# Patient Record
Sex: Male | Born: 1935 | Race: White | Hispanic: No | Marital: Married | Smoking: Former smoker
Health system: Southern US, Community
[De-identification: ages and names within clinical notes are randomized; demographics above are authoritative.]

## PROBLEM LIST (undated history)

## (undated) DIAGNOSIS — C801 Malignant (primary) neoplasm, unspecified: Secondary | ICD-10-CM

## (undated) DIAGNOSIS — I639 Cerebral infarction, unspecified: Secondary | ICD-10-CM

## (undated) DIAGNOSIS — I4891 Unspecified atrial fibrillation: Secondary | ICD-10-CM

## (undated) DIAGNOSIS — R569 Unspecified convulsions: Secondary | ICD-10-CM

## (undated) DIAGNOSIS — I10 Essential (primary) hypertension: Secondary | ICD-10-CM

## (undated) HISTORY — PX: TONSILLECTOMY AND ADENOIDECTOMY: SUR1326

---

## 1979-01-04 HISTORY — PX: CRANIOTOMY: SHX93

## 1999-12-24 ENCOUNTER — Ambulatory Visit (HOSPITAL_COMMUNITY): Admission: RE | Admit: 1999-12-24 | Discharge: 1999-12-24 | Payer: Self-pay | Admitting: Gastroenterology

## 1999-12-24 ENCOUNTER — Encounter (INDEPENDENT_AMBULATORY_CARE_PROVIDER_SITE_OTHER): Payer: Self-pay | Admitting: *Deleted

## 2002-05-28 ENCOUNTER — Encounter: Admission: RE | Admit: 2002-05-28 | Discharge: 2002-08-26 | Payer: Self-pay | Admitting: Internal Medicine

## 2010-01-11 ENCOUNTER — Inpatient Hospital Stay (HOSPITAL_COMMUNITY)
Admission: EM | Admit: 2010-01-11 | Discharge: 2010-01-18 | Payer: Self-pay | Source: Home / Self Care | Attending: Internal Medicine | Admitting: Internal Medicine

## 2010-01-18 LAB — CBC
HCT: 39.9 % (ref 39.0–52.0)
HCT: 34 % — ABNORMAL LOW (ref 39.0–52.0)
HCT: 33.4 % — ABNORMAL LOW (ref 39.0–52.0)
HCT: 31.2 % — ABNORMAL LOW (ref 39.0–52.0)
HCT: 30.5 % — ABNORMAL LOW (ref 39.0–52.0)
HCT: 29.7 % — ABNORMAL LOW (ref 39.0–52.0)
HCT: 28.1 % — ABNORMAL LOW (ref 39.0–52.0)
Hemoglobin: 13.3 g/dL (ref 13.0–17.0)
Hemoglobin: 11.5 g/dL — ABNORMAL LOW (ref 13.0–17.0)
Hemoglobin: 10.9 g/dL — ABNORMAL LOW (ref 13.0–17.0)
Hemoglobin: 10 g/dL — ABNORMAL LOW (ref 13.0–17.0)
Hemoglobin: 10 g/dL — ABNORMAL LOW (ref 13.0–17.0)
Hemoglobin: 9.6 g/dL — ABNORMAL LOW (ref 13.0–17.0)
Hemoglobin: 9.1 g/dL — ABNORMAL LOW (ref 13.0–17.0)
MCH: 29.8 pg (ref 26.0–34.0)
MCH: 30.3 pg (ref 26.0–34.0)
MCH: 29.3 pg (ref 26.0–34.0)
MCH: 29.1 pg (ref 26.0–34.0)
MCH: 29.8 pg (ref 26.0–34.0)
MCH: 29.4 pg (ref 26.0–34.0)
MCH: 29.5 pg (ref 26.0–34.0)
MCHC: 33.3 g/dL (ref 30.0–36.0)
MCHC: 33.8 g/dL (ref 30.0–36.0)
MCHC: 32.6 g/dL (ref 30.0–36.0)
MCHC: 32.1 g/dL (ref 30.0–36.0)
MCHC: 32.8 g/dL (ref 30.0–36.0)
MCHC: 32.3 g/dL (ref 30.0–36.0)
MCHC: 32.4 g/dL (ref 30.0–36.0)
MCV: 89.5 fL (ref 78.0–100.0)
MCV: 89.7 fL (ref 78.0–100.0)
MCV: 89.8 fL (ref 78.0–100.0)
MCV: 90.7 fL (ref 78.0–100.0)
MCV: 90.8 fL (ref 78.0–100.0)
MCV: 91.1 fL (ref 78.0–100.0)
MCV: 91.2 fL (ref 78.0–100.0)
Platelets: 284 10*3/uL (ref 150–400)
Platelets: 245 10*3/uL (ref 150–400)
Platelets: 238 10*3/uL (ref 150–400)
Platelets: 223 10*3/uL (ref 150–400)
Platelets: 227 10*3/uL (ref 150–400)
Platelets: 177 10*3/uL (ref 150–400)
Platelets: 201 10*3/uL (ref 150–400)
RBC: 4.46 MIL/uL (ref 4.22–5.81)
RBC: 3.79 MIL/uL — ABNORMAL LOW (ref 4.22–5.81)
RBC: 3.72 MIL/uL — ABNORMAL LOW (ref 4.22–5.81)
RBC: 3.44 MIL/uL — ABNORMAL LOW (ref 4.22–5.81)
RBC: 3.36 MIL/uL — ABNORMAL LOW (ref 4.22–5.81)
RBC: 3.26 MIL/uL — ABNORMAL LOW (ref 4.22–5.81)
RBC: 3.08 MIL/uL — ABNORMAL LOW (ref 4.22–5.81)
RDW: 15 % (ref 11.5–15.5)
RDW: 15.1 % (ref 11.5–15.5)
RDW: 15.3 % (ref 11.5–15.5)
RDW: 15.5 % (ref 11.5–15.5)
RDW: 15.5 % (ref 11.5–15.5)
RDW: 15.4 % (ref 11.5–15.5)
RDW: 15.4 % (ref 11.5–15.5)
WBC: 16.1 10*3/uL — ABNORMAL HIGH (ref 4.0–10.5)
WBC: 19.2 10*3/uL — ABNORMAL HIGH (ref 4.0–10.5)
WBC: 17.4 10*3/uL — ABNORMAL HIGH (ref 4.0–10.5)
WBC: 13.8 10*3/uL — ABNORMAL HIGH (ref 4.0–10.5)
WBC: 12 10*3/uL — ABNORMAL HIGH (ref 4.0–10.5)
WBC: 9.5 10*3/uL (ref 4.0–10.5)
WBC: 10 10*3/uL (ref 4.0–10.5)

## 2010-01-18 LAB — HEMOGLOBIN AND HEMATOCRIT, BLOOD
HCT: 29.2 % — ABNORMAL LOW (ref 39.0–52.0)
Hemoglobin: 9.5 g/dL — ABNORMAL LOW (ref 13.0–17.0)

## 2010-01-18 LAB — HEMOGLOBIN A1C
Hgb A1c MFr Bld: 5.9 % — ABNORMAL HIGH (ref <5.7)
Mean Plasma Glucose: 123 mg/dL — ABNORMAL HIGH (ref <117)

## 2010-01-18 LAB — GLUCOSE, CAPILLARY
Glucose-Capillary: 109 mg/dL — ABNORMAL HIGH (ref 70–99)
Glucose-Capillary: 109 mg/dL — ABNORMAL HIGH (ref 70–99)
Glucose-Capillary: 110 mg/dL — ABNORMAL HIGH (ref 70–99)
Glucose-Capillary: 124 mg/dL — ABNORMAL HIGH (ref 70–99)
Glucose-Capillary: 129 mg/dL — ABNORMAL HIGH (ref 70–99)
Glucose-Capillary: 129 mg/dL — ABNORMAL HIGH (ref 70–99)
Glucose-Capillary: 147 mg/dL — ABNORMAL HIGH (ref 70–99)
Glucose-Capillary: 114 mg/dL — ABNORMAL HIGH (ref 70–99)
Glucose-Capillary: 115 mg/dL — ABNORMAL HIGH (ref 70–99)
Glucose-Capillary: 116 mg/dL — ABNORMAL HIGH (ref 70–99)
Glucose-Capillary: 116 mg/dL — ABNORMAL HIGH (ref 70–99)
Glucose-Capillary: 130 mg/dL — ABNORMAL HIGH (ref 70–99)
Glucose-Capillary: 134 mg/dL — ABNORMAL HIGH (ref 70–99)
Glucose-Capillary: 136 mg/dL — ABNORMAL HIGH (ref 70–99)
Glucose-Capillary: 179 mg/dL — ABNORMAL HIGH (ref 70–99)

## 2010-01-18 LAB — COMPREHENSIVE METABOLIC PANEL
ALT: 14 U/L (ref 0–53)
AST: 19 U/L (ref 0–37)
Albumin: 3.4 g/dL — ABNORMAL LOW (ref 3.5–5.2)
Alkaline Phosphatase: 73 U/L (ref 39–117)
BUN: 14 mg/dL (ref 6–23)
CO2: 26 mEq/L (ref 19–32)
Calcium: 9 mg/dL (ref 8.4–10.5)
Chloride: 104 mEq/L (ref 96–112)
Creatinine, Ser: 1.1 mg/dL (ref 0.4–1.5)
GFR calc Af Amer: >60 mL/min (ref >60)
GFR calc non Af Amer: >60 mL/min (ref >60)
Glucose, Bld: 196 mg/dL — ABNORMAL HIGH (ref 70–99)
Potassium: 4.4 mEq/L (ref 3.5–5.1)
Sodium: 141 mEq/L (ref 135–145)
Total Bilirubin: 0.9 mg/dL (ref 0.3–1.2)
Total Protein: 6.3 g/dL (ref 6.0–8.3)

## 2010-01-18 LAB — URINALYSIS, ROUTINE W REFLEX MICROSCOPIC
Bilirubin Urine: NEGATIVE
Hgb urine dipstick: NEGATIVE
Ketones, ur: NEGATIVE mg/dL
Leukocytes, UA: NEGATIVE
Nitrite: NEGATIVE
Protein, ur: 30 mg/dL — AB
Specific Gravity, Urine: 1.024 (ref 1.005–1.030)
Urine Glucose, Fasting: NEGATIVE mg/dL
Urobilinogen, UA: 1 mg/dL (ref 0.0–1.0)
pH: 6.5 (ref 5.0–8.0)

## 2010-01-18 LAB — BASIC METABOLIC PANEL
BUN: 25 mg/dL — ABNORMAL HIGH (ref 6–23)
BUN: 24 mg/dL — ABNORMAL HIGH (ref 6–23)
CO2: 29 mEq/L (ref 19–32)
CO2: 27 mEq/L (ref 19–32)
Calcium: 8.8 mg/dL (ref 8.4–10.5)
Calcium: 8.3 mg/dL — ABNORMAL LOW (ref 8.4–10.5)
Chloride: 107 mEq/L (ref 96–112)
Chloride: 110 mEq/L (ref 96–112)
Creatinine, Ser: 1.46 mg/dL (ref 0.4–1.5)
Creatinine, Ser: 0.98 mg/dL (ref 0.4–1.5)
GFR calc Af Amer: 57 mL/min — ABNORMAL LOW (ref >60)
GFR calc Af Amer: >60 mL/min (ref >60)
GFR calc non Af Amer: 47 mL/min — ABNORMAL LOW (ref >60)
GFR calc non Af Amer: >60 mL/min (ref >60)
Glucose, Bld: 122 mg/dL — ABNORMAL HIGH (ref 70–99)
Glucose, Bld: 115 mg/dL — ABNORMAL HIGH (ref 70–99)
Potassium: 4.9 mEq/L (ref 3.5–5.1)
Potassium: 4.1 mEq/L (ref 3.5–5.1)
Sodium: 144 mEq/L (ref 135–145)
Sodium: 144 mEq/L (ref 135–145)

## 2010-01-18 LAB — TYPE AND SCREEN
ABO/RH(D): B NEG
Antibody Screen: NEGATIVE

## 2010-01-18 LAB — POCT CARDIAC MARKERS
CKMB, poc: <1 ng/mL — ABNORMAL LOW (ref 1.0–8.0)
Myoglobin, poc: 99.9 ng/mL (ref 12–200)
Troponin i, poc: <0.05 ng/mL (ref 0.00–0.09)

## 2010-01-18 LAB — DIFFERENTIAL
Basophils Absolute: 0 10*3/uL (ref 0.0–0.1)
Basophils Relative: 0 % (ref 0–1)
Eosinophils Absolute: 0 10*3/uL (ref 0.0–0.7)
Eosinophils Relative: 0 % (ref 0–5)
Lymphocytes Relative: 12 % (ref 12–46)
Lymphs Abs: 2 10*3/uL (ref 0.7–4.0)
Monocytes Absolute: 0.6 10*3/uL (ref 0.1–1.0)
Monocytes Relative: 4 % (ref 3–12)
Neutro Abs: 13.5 10*3/uL — ABNORMAL HIGH (ref 1.7–7.7)
Neutrophils Relative %: 84 % — ABNORMAL HIGH (ref 43–77)

## 2010-01-18 LAB — LIPASE, BLOOD: Lipase: 32 U/L (ref 11–59)

## 2010-01-18 LAB — ABO/RH: ABO/RH(D): B NEG

## 2010-01-18 LAB — URINE CULTURE
Colony Count: NO GROWTH
Culture  Setup Time: 201201091525
Culture: NO GROWTH

## 2010-01-18 LAB — MAGNESIUM: Magnesium: 2.3 mg/dL (ref 1.5–2.5)

## 2010-01-18 LAB — APTT: aPTT: 30 seconds (ref 24–37)

## 2010-01-18 LAB — URINE MICROSCOPIC-ADD ON

## 2010-01-18 LAB — LACTIC ACID, PLASMA: Lactic Acid, Venous: 4 mmol/L — ABNORMAL HIGH (ref 0.5–2.2)

## 2010-01-18 LAB — PROTIME-INR
INR: 1.13 (ref 0.00–1.49)
Prothrombin Time: 14.7 seconds (ref 11.6–15.2)

## 2010-01-18 LAB — DIGOXIN LEVEL: Digoxin Level: 0.6 ng/mL — ABNORMAL LOW (ref 0.8–2.0)

## 2010-01-25 NOTE — Consult Note (Signed)
Arthur Wood, Arthur Wood NO.:  [id number]  MEDICAL RECORD NO.:  [id number]          PATIENT TYPE:  INP  LOCATION:  1506                         FACILITY:  [facility]  PHYSICIAN:  Valetta Fuller, M.D.  DATE OF BIRTH:  1931-02-14  DATE OF CONSULTATION:  01/12/2010 DATE OF DISCHARGE:                                CONSULTATION   REASON FOR CONSULTATION:  Left renal mass.  HISTORY OF PRESENT ILLNESS:  Arthur Wood is 77 years of age with a number of medical issues.  He does not appear to have any prior urologic history.  The patient was admitted yesterday with some right lower quadrant abdominal pain and an abdominal mass.  The patient has been known to have a large right inguinal hernia, but began experiencing more discomfort in his right lower quadrant.  A CT scan was done, which showed a right lower quadrant mass, probably a hematoma, but the etiology remains somewhat unclear.  There was also an incidental 2-cm renal mass noted, consistent with a renal cell carcinoma.  The patient subsequently had an MRI of his abdomen with contrast.  Again, an exophytic 2.2-cm mass was noted in the upper pole of the left kidney medially.  Findings again most consistent with small renal cell carcinoma.  Within the right hemipelvis, there was a complex fluid collection.  The patient had evidence of a large right inguinal hernia containing bowel.  There was also this complex fluid collection probably most consistent with complex hematoma; but again, that has not been completely sorted out at this point.  The family believes that General Surgery is contemplating a laparoscopic assessment along with an inguinal hernia repair.  The patient is having no significant voiding complaints at this time.  He does continue to have discomfort in his right lower quadrant, worse with movement and certain positions.  Patient's past medical history is notable for: 1. Significant history of CVA. 2. The  patient has also been diagnosed with hypertension, asthma and     atrial fibrillation. 3. He has had a left inguinal hernia repair in the past.  The patient's medications at the time of admission included hydrochlorothiazide, clonidine, lisinopril, digoxin, Lasix, nifedipine, Plavix, potassium, phenobarbital, Serevent, albuterol, Azmacort.  He has no drug allergies.  The patient has a prior significant tobacco use history and quit 20-25 years ago.  REVIEW OF SYSTEMS:  Positive for abdominal discomfort, some fatigue and decreased appetite.  It is otherwise negative.  On exam, he is a well-developed, well-nourished male.  He appears alert and oriented.  He does not appear to be atoxic or in acute distress.  He is currently afebrile, with a temperature of 99.4.  Blood pressure is 136/64 with a pulse of 82.  No obvious JVD.  Respiratory effort is normal.  The patient's abdomen is soft.  Positive right lower quadrant tenderness.  No obvious hepatosplenomegaly.  No definitive mass appreciated.  GENITOURINARY EXAM:  Shows an enlarged right hemiscrotum with obvious bowel contents.  No other obvious urologic problems noted. EXTREMITIES:  Without obvious tenderness or edema.  LABORATORY DATA:  The patient's creatinine is 1.46.  Patient's hemoglobin  was 10.0, with a white count of 13.8 thousand.  ASSESSMENT:  Small 2-cm left renal mass.  The imaging studies with CT and MRI are certainly consistent with a small renal cell carcinoma. This problem is obviously not contributing at this time to this patient's current acute illness.  I explained to the family that there are a number of approaches for small renal masses.  In elderly patients who have a number of medical comorbidities, serial observation to assess growth of the lesion with intervention if it does increase in size is certainly one approach.  The patient is potentially a candidate for open partial nephrectomy or laparoscopic partial  nephrectomy, but small lesions of this size in patients with these medical comorbidities are often best managed with percutaneous approaches by interventional radiology with either cryo or radiofrequency ablation.  It is conceivable that surgery could be coordinated with one of our laparoscopic urologists, Dr. Laverle Patter or Dr. Retta Diones, but I suspect the best approach for this patient is to deal with the acute problem with further investigation of this right pelvic mass along with correction of the hernia and subsequent consultation with Interventional Radiology for consideration of a biopsy and ablative procedure for the left renal mass.  We will be happy to discuss the situation with Dr. Nehemiah Settle or Dr. Cicero Duck.     Valetta Fuller, M.D.     DSG/MEDQ  D:  01/12/2010  T:  01/12/2010  Job:  [id number]  cc:   Currie Paris, M.D. [address] [place] [address]  Deirdre Peer. Lanny Lipkin, M.D.  Electronically Signed by Barron Alvine M.D. on 01/25/2010 09:21:56 AM

## 2010-01-27 ENCOUNTER — Other Ambulatory Visit: Payer: Self-pay | Admitting: Urology

## 2010-01-27 DIAGNOSIS — N2889 Other specified disorders of kidney and ureter: Secondary | ICD-10-CM

## 2010-02-02 NOTE — H&P (Signed)
Arthur Wood, Arthur Wood NO.:  [id number]  MEDICAL RECORD NO.:  [id number]          PATIENT TYPE:  EMS  LOCATION:  ED                           FACILITY:  [facility]  PHYSICIAN:  Lonia Blood, M.D.DATE OF BIRTH:  24-Mar-1931  DATE OF ADMISSION:  01/11/2010 DATE OF DISCHARGE:                             HISTORY & PHYSICAL   PRIMARY CARE PHYSICIAN:  Deirdre Peer. Polite, M.D.  CHIEF COMPLAINT:  Abdominal pain.  HISTORY OF PRESENT ILLNESS:  The patient is a very pleasant 77 year old gentleman with a complex past medical history as detailed below.  He was in his usual state of health until Saturday (it is presently Monday) when he developed the acute onset of right lower quadrant abdominal pain.  He reports that concomitant with this, he has not been able to have a bowel movement since that time.  He describes the pain as severe and cramping and coming in paroxysms.  It seems to be worse with movement or attempts to ambulate.  There is some associated nausea but no vomiting.  There has been no hematemesis, hematochezia or melena. There has been no lightheadedness, dizzy spells chest pain, shortness of breath or diaphoresis.  There have been no subjective fevers per his report.  The patient has a known large right-sided inguinal hernia but he states that he usually is able to reduce this himself and it does not cause any problems.  The patient's appetite has been severely diminished during the course of his abdominal pain.  REVIEW OF SYSTEMS:  A comprehensive review of systems is unrevealing with exception to the multiple positive elements noted in the history of present illness above.  PAST MEDICAL HISTORY: 1. Hypertension. 2. "Asthma" - likely COPD with frequent episodes of acute bronchitis. 3. Paroxysmal atrial fibrillation - not on Coumadin due to apparent     history of intracranial hemorrhage while on Coumadin. 4. History of basal cell carcinomas with multiple  resections   (a) Face and chest, 1996; left arm and right ear, 2004. 1. History of CVA x3 in 1981, 1992 and 1998 - apparently one was     hemorrhagic related to Coumadin. 2. Seizure disorder status post CVA. 3. Status post hernia operation in 1996 with persistent right inguinal     hernia. 4. Status post bilateral cataract extractions.  OUTPATIENT MEDICATIONS: 1. Bisoprolol/HCTZ 10/6.25 daily. 2. Clonidine 0.1 mg b.i.d. 3. Lisinopril 40 mg daily. 4. Digoxin 250 mcg daily. 5. Lasix 20 mg daily. 6. Nifedipine XL 60 mg daily. 7. Plavix 75 mg daily. 8. Potassium chloride 20 mEq daily. 9. Phenobarbital 90 mg p.o. at bedtime. 10.Serevent 2 puffs b.i.d. 11.Albuterol 2 puffs p.r.n. shortness of breath/wheeze. 12.Azmacort 2 puffs b.i.d.  ALLERGIES:  NO KNOWN DRUG ALLERGIES.  FAMILY HISTORY:  Noncontributory.  Positive only for a sister who had breast cancer and underwent bilateral mastectomy.  SOCIAL HISTORY:  The patient has a prior history of extensive tobacco abuse but discontinued smoking 25 years ago.  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  He is married.  [place]  He does not drink alcohol.  DATA REVIEW:  Hemoglobin is normal.  Platelet count is normal.  MCV is normal.  White count is elevated at 16.1.  Sodium, potassium chloride bicarb, BUN and creatinine are normal.  Serum glucose is elevated at 196.  Calcium is normal.  LFTs are normal.  Albumin is 3.4.  Urinalysis reveals 0-2 white blood cells but many bacteria.  INR is normal.  PTT is normal.  Lipase is 32.  Lactic acid level is elevated at 4.0.  Chest x- ray reveals an elevated left hemidiaphragm.  CT scan of the abdomen and pelvis reveals a 6 x 8 x 9 cm soft tissue mass versus possible hematoma in the right lower quadrant lateral to the colon.  A comment is also made that there is ascites versus hemoperitoneum.  There is a 2 cm indeterminate mass in the upper pole of the  left kidney.  The dictating radiologist recommends an  abdomen and pelvis MRI with and without contrast.  Note is also made of a large right inguinal hernia containing cecum and an associated hydrocele as well as diverticulosis.  PHYSICAL EXAMINATION:  VITAL SIGNS:  Temperature 99.4, blood pressure 125/82, heart rate 82, respiratory rate 18, O2 saturation is 94% on room air.  GENERAL:  Well-developed, well-nourished gentleman in no acute respiratory distress. HEENT:  Normocephalic, atraumatic.  Pupils are equal, round and reactive light and accommodation.  Extraocular muscles intact bilaterally.  OC/OP clear. NECK:  No JVD. LUNGS:  Clear to auscultation bilaterally without wheezes or rhonchi. CARDIOVASCULAR:  Regular rate and rhythm at the present time without gallop or rub.  Normal S1, S2. ABDOMEN:  Very tender to even slight palpation in the right lower quadrant.  The abdomen is soft.  Bowel sounds are hypoactive but present.  There is no rebound.  The patient does have an appreciable mass in the right lower quadrant and a significant right inguinal hernia.  Please see General Surgery's examination/note. EXTREMITIES:  No significant cyanosis, clubbing or edema in bilateral lower extremities. NEUROLOGIC:  The patient is alert and oriented x4.  Cranial nerves II- XII appear to be intact bilaterally.  He displays 4/5 strength in the right upper extremity and 5/5 strength in the left upper and lower extremities.  He is intact to sensation to touch throughout.  IMPRESSION AND PLAN: 1. Abdominal pain with abdominal mass - Arthur Wood presents with a     rather large soft tissue mass versus hematoma in the right lower     quadrant.  There is no evidence of acute severe blood loss.  Given     there is concern that this could represent a hematoma with an     associated hemoperitoneum, we will cycle serial CBCs nonetheless.     It is comforting to know that his coags are normal, but we will  be     forced to hold his Plavix until we can prove that this is not a     hematoma and that there is not intra-abdominal bleeding.  General     Surgery has already evaluated the patient.  Our plan at this point     is to proceed with an MRI and MRA of the abdomen for further     evaluation of this mass.  Ultimately, the patient may require     surgical intervention versus percutaneous biopsy. 2. Left upper pole kidney mass - As noted above, this will be     evaluated further with an MRI of the abdomen and pelvis.  Further     evaluation and consideration of a urologic consultation will be     entertained once these images are available. 3. Hyperglycemia - The patient has no known history of diabetes.  This     could simply be a stress reaction.  We will check a hemoglobin A1c     and we will temporarily place the patient on sliding scale insulin     to assure that his CBG remains well controlled.  We do wish,     however, to avoid hypoglycemia. 4. Leukocytosis - No clear foci of infection is appreciable.  In that     selection of antibiotics is severely handicapped by the fact that     no obvious infection is present, we will avoid initiating empiric     antibiotics.  The patient will be followed for fever.  Serial CBCs     will also be assessed.  We will send the patient's urine for     culture as a precaution. 5. "Asthma"/questionable chronic obstructive pulmonary disease - We     will continue the patient on Serevent and Azmacort.  He will be     given albuterol on an as needed basis. 6. Paroxysmal atrial fibrillation - During the initial portion of the     hospital stay, we will monitor the patient on telemetry.  This is     out of concern that the acute stress of his illness and his pain     could incite an episode of rapid ventricular response.  If the     patient's heart rate remains stable for the first 24-48 hours, we     will consider discontinuing telemetry. 7. Hypertension  - We will continue the patient's bisoprolol due to the     combined indication of atrial fibrillation and hypertension.  We     will hold his lisinopril as well as his diuretics and he will be     limited to n.p.o. status given the significant amount of contrast     for his CT of the abdomen and pelvis.  Dr. Renford Dills will be assuming care of this patient on January 12, 2010.  It has been a pleasure to participate in this patient's care during the admission process.     Lonia Blood, M.D.     JTM/MEDQ  D:  01/11/2010  T:  01/11/2010  Job:  [id number]  cc:   Deirdre Peer. Polite, M.D.  Electronically Signed by Jetty Duhamel M.D. on 02/02/2010 09:49:50 AM

## 2010-02-04 NOTE — Discharge Summary (Signed)
NAMEMIHIR, FLANIGAN NO.:  [id number]  MEDICAL RECORD NO.:  [id number]          PATIENT TYPE:  INP  LOCATION:  1506                         FACILITY:  [facility]  PHYSICIAN:  Deirdre Peer. Shantae Vantol, M.D. DATE OF BIRTH:  09/13/1931  DATE OF ADMISSION:  01/11/2010 DATE OF DISCHARGE:                              DISCHARGE SUMMARY   DISCHARGE DIAGNOSES: 1. Transiently incarcerated right inguinal hernia, plans for     outpatient surgery in approximately 3 weeks.  The patient has been     followed by General Surgery during this hospitalization, right     inguinal hernia easily reducible at time of discharge. 2. Right lower quadrant hematoma, etiology unknown.  Currently the     patient's Plavix is being held.  The patient has had a CT, MRI as     well as a followup CT.  Followup CT showed contraction of hematoma. 3. Anemia, multifactorial secondary to hematoma which was an expected     drop as well as intravenous fluids and multiple blood draws.  Last     hemoglobin 9.5, which was an increase from 9.1.  No transfusion     given during this hospitalization. 4. Hypertension stable. 5. History of hemorrhagic cerebrovascular accident. 6. History of seizure disorder, stable. 7. Asthma.  DISCHARGE MEDICATIONS: 1. Azmacort inhaler b.i.d. 2. Lisinopril 40 mg q.a.m. 3. Phenobarbital 60 mg one and one-half tablet daily. 4. Bisoprolol HCTZ 10/6.25. 5. Digoxin 0.25 mg daily. 6. Clonidine 0.1 mg b.i.d. 7. Nifedipine 60 mg daily. 8. Crestor 5 mg daily. 9. Lasix 20 mg daily.  Please note the patient will hold his Plavix     until further notice. 10.Potassium chloride 20 mEq daily. 11.Proventil inhaler q.6 hours p.r.n.  DISPOSITION:  The patient will be discharged to home.  FOLLOWUP:  The patient will follow up with primary MD in approximately one week.  The patient will have outpatient followup with General Surgery.  PLAN:  The plan is for elective surgery on an outpatient  basis in approximately 3 to 4 weeks.  STUDIES: 1. CT of the abdomen and pelvis on January 9:  A 6 x 8 x 9 soft-tissue     mass versus hematoma in the right lower quadrant lateral to the     colon, 2 cm indeterminate mass in the upper pole of the left     kidney, large right inguinal hernia containing the cecum with     associated hydrocele. 2. MRI of the abdomen:  Complex fluid collection in the right pelvis     anterior to the ascending colon, probably a hematoma.  It also     showed an exophytic mass in the left kidney upper pole. 3. Followup CT on January 13 showed contraction of hematoma, showed a     right inguinal hernia with fluid and also showed an enhancing left     renal lesion as previously described. 4. Hemoglobin from January 13 was 9.5.  BMET unremarkable.  Urine     culture negative.  Hemoglobin A1c 5.9.  CONSULTANTS: 1. [facility]. 2. [facility].  HISTORY OF PRESENT ILLNESS:  A  77 year old male who presented to the hospital with complaint of abdominal pain.  In the ED the patient was evaluated and found to have a large inguinal hernia.  Admission was deemed necessary for further evaluation and treatment.  Please see dictated H and P for further details.  PAST MEDICAL HISTORY:  Per admission H and P.  MEDICATIONS:  Per admission H and P.  SOCIAL HISTORY:  Per admission H and P.  PAST SURGICAL HISTORY:  Per admission H and P.  ALLERGIES:  Per admission H and P.  HOSPITAL COURSE:  The patient was admitted to telemetry floor bed for evaluation and treatment of transiently incarcerated right inguinal hernia.  The patient had abnormalities as noted above including a right lower quadrant hematoma, a right inguinal hernia and a left kidney mass. He was seen in consultation by Surgery and Urology.  Urology outlined the options for treatment of the left renal mass which included watchful waiting versus a percutaneous procedure versus an open procedure at  the time of repair of the right inguinal hernia.  The patient's hernia was easily reduced upon hospitalization with conservative measures including bowel rest, IV fluids and analgesia.  As the hernia was easily reduced, there were no plans for emergent surgery.  Ultimately it was determined that outpatient surgery in approximately 3 to 4 weeks would be appropriate.  In reference to the right lower quadrant hematoma, etiology is unknown.  However, the patient was on Plavix and that has been held during this hospitalization and it will continue to be held. Currently the patient has been stable.  The plans are for discharge with further outpatient management of the right inguinal hernia.  Please note, the patient has several other chronic medical problems which have been stable during this hospitalization.  Greater than half an hour has been spent on the disposition of this patient.     Deirdre Peer. Aren Cherne, M.D.     RDP/MEDQ  D:  01/18/2010  T:  01/18/2010  Job:  [id number]  Electronically Signed by Windy Fast Mau[place] Britten M.D. on 02/04/2010 10:02:56 AM

## 2010-02-10 ENCOUNTER — Ambulatory Visit
Admission: RE | Admit: 2010-02-10 | Discharge: 2010-02-10 | Disposition: A | Discharge status: Home / Self Care | Payer: Medicare Other | Source: Ambulatory Visit | Attending: Urology | Admitting: Urology

## 2010-02-10 VITALS — BP 106/62 | HR 55 | Temp 97.0°F | Resp 17 | Ht 69.0 in | Wt 210.0 lb

## 2010-02-10 DIAGNOSIS — N2889 Other specified disorders of kidney and ureter: Secondary | ICD-10-CM

## 2010-02-11 ENCOUNTER — Other Ambulatory Visit: Payer: Self-pay

## 2010-02-23 ENCOUNTER — Other Ambulatory Visit: Payer: Self-pay | Admitting: Interventional Radiology

## 2010-02-23 ENCOUNTER — Other Ambulatory Visit (HOSPITAL_COMMUNITY): Payer: Self-pay | Admitting: Interventional Radiology

## 2010-02-23 DIAGNOSIS — N2889 Other specified disorders of kidney and ureter: Secondary | ICD-10-CM

## 2010-03-08 ENCOUNTER — Other Ambulatory Visit: Payer: Self-pay | Admitting: Interventional Radiology

## 2010-03-08 ENCOUNTER — Encounter (HOSPITAL_COMMUNITY): Payer: Medicare Other

## 2010-03-08 DIAGNOSIS — Z01812 Encounter for preprocedural laboratory examination: Secondary | ICD-10-CM | POA: Insufficient documentation

## 2010-03-08 LAB — BASIC METABOLIC PANEL
BUN: 12 mg/dL (ref 6–23)
CO2: 33 mEq/L — ABNORMAL HIGH (ref 19–32)
Calcium: 9.1 mg/dL (ref 8.4–10.5)
Chloride: 106 mEq/L (ref 96–112)
Creatinine, Ser: 0.88 mg/dL (ref 0.4–1.5)

## 2010-03-08 LAB — CBC
Hemoglobin: 13.4 g/dL (ref 13.0–17.0)
MCH: 28.4 pg (ref 26.0–34.0)
MCHC: 31.2 g/dL (ref 30.0–36.0)
MCV: 91.1 fL (ref 78.0–100.0)
RBC: 4.72 MIL/uL (ref 4.22–5.81)

## 2010-03-08 LAB — CROSSMATCH: Antibody Screen: NEGATIVE

## 2010-03-12 ENCOUNTER — Other Ambulatory Visit: Payer: Self-pay | Admitting: Interventional Radiology

## 2010-03-12 ENCOUNTER — Ambulatory Visit (HOSPITAL_COMMUNITY)
Admission: RE | Admit: 2010-03-12 | Discharge: 2010-03-12 | Disposition: A | Discharge status: Home / Self Care | Payer: Medicare Other | Source: Ambulatory Visit | Attending: Interventional Radiology | Admitting: Interventional Radiology

## 2010-03-12 ENCOUNTER — Ambulatory Visit (HOSPITAL_COMMUNITY)
Admission: RE | Admit: 2010-03-12 | Discharge: 2010-03-13 | Disposition: A | Discharge status: Home / Self Care | Payer: Medicare Other | Source: Ambulatory Visit | Attending: Interventional Radiology | Admitting: Interventional Radiology

## 2010-03-12 DIAGNOSIS — N289 Disorder of kidney and ureter, unspecified: Secondary | ICD-10-CM | POA: Insufficient documentation

## 2010-03-12 DIAGNOSIS — Z01812 Encounter for preprocedural laboratory examination: Secondary | ICD-10-CM | POA: Insufficient documentation

## 2010-03-12 DIAGNOSIS — I69991 Dysphagia following unspecified cerebrovascular disease: Secondary | ICD-10-CM | POA: Insufficient documentation

## 2010-03-12 DIAGNOSIS — D4959 Neoplasm of unspecified behavior of other genitourinary organ: Secondary | ICD-10-CM | POA: Insufficient documentation

## 2010-03-12 DIAGNOSIS — R1310 Dysphagia, unspecified: Secondary | ICD-10-CM | POA: Insufficient documentation

## 2010-03-12 DIAGNOSIS — N2889 Other specified disorders of kidney and ureter: Secondary | ICD-10-CM

## 2010-03-12 DIAGNOSIS — I498 Other specified cardiac arrhythmias: Secondary | ICD-10-CM | POA: Insufficient documentation

## 2010-03-13 LAB — CBC
Hemoglobin: 11.4 g/dL — ABNORMAL LOW (ref 13.0–17.0)
RBC: 4.04 MIL/uL — ABNORMAL LOW (ref 4.22–5.81)
WBC: 8.7 10*3/uL (ref 4.0–10.5)

## 2010-03-13 LAB — BASIC METABOLIC PANEL
CO2: 30 mEq/L (ref 19–32)
Calcium: 8.1 mg/dL — ABNORMAL LOW (ref 8.4–10.5)
Chloride: 109 mEq/L (ref 96–112)
GFR calc Af Amer: >60 mL/min (ref >60)
Potassium: 4.1 mEq/L (ref 3.5–5.1)
Sodium: 142 mEq/L (ref 135–145)

## 2010-03-15 ENCOUNTER — Other Ambulatory Visit: Payer: Self-pay | Admitting: Interventional Radiology

## 2010-03-15 DIAGNOSIS — N2889 Other specified disorders of kidney and ureter: Secondary | ICD-10-CM

## 2010-03-17 ENCOUNTER — Other Ambulatory Visit (HOSPITAL_COMMUNITY): Payer: Self-pay | Admitting: Interventional Radiology

## 2010-03-17 DIAGNOSIS — N2889 Other specified disorders of kidney and ureter: Secondary | ICD-10-CM

## 2010-04-27 ENCOUNTER — Other Ambulatory Visit: Payer: Self-pay | Admitting: Dermatology

## 2010-04-27 ENCOUNTER — Encounter (HOSPITAL_COMMUNITY): Payer: Self-pay

## 2010-04-27 ENCOUNTER — Ambulatory Visit
Admission: RE | Admit: 2010-04-27 | Discharge: 2010-04-27 | Disposition: A | Discharge status: Home / Self Care | Payer: Medicare Other | Source: Ambulatory Visit | Attending: Interventional Radiology | Admitting: Interventional Radiology

## 2010-04-27 ENCOUNTER — Ambulatory Visit (HOSPITAL_COMMUNITY)
Admission: RE | Admit: 2010-04-27 | Discharge: 2010-04-27 | Disposition: A | Discharge status: Home / Self Care | Payer: Medicare Other | Source: Ambulatory Visit | Attending: Interventional Radiology | Admitting: Interventional Radiology

## 2010-04-27 VITALS — BP 118/68 | HR 57 | Temp 98.0°F | Resp 17

## 2010-04-27 DIAGNOSIS — N2889 Other specified disorders of kidney and ureter: Secondary | ICD-10-CM

## 2010-04-27 DIAGNOSIS — Q619 Cystic kidney disease, unspecified: Secondary | ICD-10-CM | POA: Insufficient documentation

## 2010-04-27 DIAGNOSIS — N289 Disorder of kidney and ureter, unspecified: Secondary | ICD-10-CM | POA: Insufficient documentation

## 2010-04-27 DIAGNOSIS — K8020 Calculus of gallbladder without cholecystitis without obstruction: Secondary | ICD-10-CM | POA: Insufficient documentation

## 2010-04-27 HISTORY — DX: Essential (primary) hypertension: I10

## 2010-04-27 HISTORY — DX: Unspecified atrial fibrillation: I48.91

## 2010-04-27 MED ORDER — IOHEXOL 300 MG/ML  SOLN
100.0000 mL | Freq: Once | INTRAMUSCULAR | Status: AC | PRN
  Administered 2010-04-27: 100 mL via INTRAVENOUS

## 2010-04-27 NOTE — Progress Notes (Signed)
Appetite: fair.  Denies urinary problems.  No hematuria.  Denies pain assoc w/ procedure.  Sleeping well.  Fatigues easily.

## 2010-07-22 ENCOUNTER — Ambulatory Visit (HOSPITAL_COMMUNITY)
Admission: RE | Admit: 2010-07-22 | Discharge: 2010-07-22 | Disposition: A | Discharge status: Home / Self Care | Payer: Medicare Other | Source: Ambulatory Visit | Attending: Gastroenterology | Admitting: Gastroenterology

## 2010-07-22 ENCOUNTER — Other Ambulatory Visit: Payer: Self-pay | Admitting: Gastroenterology

## 2010-07-22 DIAGNOSIS — Z860100 Personal history of colon polyps, unspecified: Secondary | ICD-10-CM | POA: Insufficient documentation

## 2010-07-22 DIAGNOSIS — K620 Anal polyp: Secondary | ICD-10-CM | POA: Insufficient documentation

## 2010-07-22 DIAGNOSIS — Z5309 Procedure and treatment not carried out because of other contraindication: Secondary | ICD-10-CM | POA: Insufficient documentation

## 2010-07-22 DIAGNOSIS — K621 Rectal polyp: Secondary | ICD-10-CM | POA: Insufficient documentation

## 2010-08-05 NOTE — Op Note (Signed)
  NAME:  Arthur Wood, Arthur Wood NO.:  [id number]  MEDICAL RECORD NO.:  [id number]  LOCATION:  WLEN                         FACILITY:  [facility]  PHYSICIAN:  Danise Edge, M.D.   DATE OF BIRTH:  1931-08-27  DATE OF PROCEDURE:  07/22/2010 DATE OF DISCHARGE:                              OPERATIVE REPORT   PROCEDURE:  Aborted colonoscopy.  REFERRING PHYSICIAN:  Dr. Renford Dills.  HISTORY:  Arthur Wood is a 77 year old male born [birth year]. The patient underwent a colonoscopy in 2001 with removal of a 5-mm polyp.  In 2007, his surveillance colonoscopy was normal but the prep was not ideal.  The patient was scheduled to undergo a surveillance colonoscopy with polypectomy to prevent colon cancer.  A proctoscopic exam was performed. A colonoscopy was not performed because the colon was inadequately prepped.  ENDOSCOPIST:  Danise Edge, M.D.  PREMEDICATIONS:  Fentanyl 50 mcg, Versed 5 mg.  PROCEDURE:  The patient was placed in the left lateral decubitus position.  Anal inspection and digital rectal exam were normal.  A proctoscopic exam was performed.  Three diminutive rectal polyps ranging in size from 2 to 3 mm were removed with the cold snare and cold biopsy forceps.  The colon was filled with liquid and solid stool and a colonoscopy was not performed.  ASSESSMENT:  Proctoscopic exam was performed with removal of 3 diminutive polyps.  A colonoscopy was not performed due to an inadequate colon prep.  RECOMMENDATIONS:  The patient will require reprepping for a surveillance colonoscopy.          ______________________________ Danise Edge, M.D.     MJ/MEDQ  D:  07/22/2010  T:  07/22/2010  Job:  [id number]  Electronically Signed by Danise Edge M.D. on 08/05/2010 04:13:50 PM

## 2010-09-22 ENCOUNTER — Other Ambulatory Visit: Payer: Self-pay | Admitting: Interventional Radiology

## 2010-09-22 DIAGNOSIS — D4112 Neoplasm of uncertain behavior of left renal pelvis: Secondary | ICD-10-CM

## 2010-09-22 DIAGNOSIS — N2889 Other specified disorders of kidney and ureter: Secondary | ICD-10-CM

## 2010-09-23 ENCOUNTER — Ambulatory Visit (HOSPITAL_COMMUNITY)
Admission: RE | Admit: 2010-09-23 | Discharge: 2010-09-23 | Disposition: A | Discharge status: Home / Self Care | Payer: Medicare Other | Source: Ambulatory Visit | Attending: Gastroenterology | Admitting: Gastroenterology

## 2010-09-23 DIAGNOSIS — Z860100 Personal history of colon polyps, unspecified: Secondary | ICD-10-CM | POA: Insufficient documentation

## 2010-09-23 DIAGNOSIS — K5730 Diverticulosis of large intestine without perforation or abscess without bleeding: Secondary | ICD-10-CM | POA: Insufficient documentation

## 2010-09-23 DIAGNOSIS — Z09 Encounter for follow-up examination after completed treatment for conditions other than malignant neoplasm: Secondary | ICD-10-CM | POA: Insufficient documentation

## 2010-10-03 NOTE — Op Note (Signed)
  NAME:  AMARE, BAIL NO.:  [id number]  MEDICAL RECORD NO.:  [id number]  LOCATION:  WLEN                         FACILITY:  [facility]  PHYSICIAN:  Danise Edge, M.D.   DATE OF BIRTH:  1931-04-09  DATE OF PROCEDURE:  09/23/2010 DATE OF DISCHARGE:                              OPERATIVE REPORT   REFERRING PHYSICIAN:  Deirdre Peer. Polite, M.D.  HISTORY:  Mr. Council Munguia is a 77 year old male, born on Aug 08, 1931.  The patient has undergone colonoscopic exams to remove colon polyps in the past.  In 2007, his surveillance colonoscopy was normal. He is scheduled to undergo a surveillance colonoscopy today.  ENDOSCOPIST:  Danise Edge, M.D.  PREMEDICATION: 1. Fentanyl 50 mcg. 2. Versed 5 mg.  PROCEDURE:  The patient was placed in the left lateral decubitus position.  Anal inspection and digital rectal exam were normal.  The Pentax pediatric colonoscope was introduced into the rectum and easily advanced to the cecum.  A normal-appearing ileocecal valve and appendiceal orifice were identified.  Colonic preparation for the exam today was fair.  Rectum normal.  Retroflexed view of the distal rectum normal.  Sigmoid colon.  Colonic diverticulosis.  Descending colon normal.  Splenic flexure normal.  Transverse colon normal.  Hepatic flexure normal.  Ascending colon normal.  Cecum and ileocecal valve normal.  ASSESSMENT:  Normal surveillance proctocolonoscopy of the cecum. Colonic preparation for the exam today was fair.  RECOMMENDATIONS:  Repeat surveillance colonoscopy is not recommended.          ______________________________ Danise Edge, M.D.     MJ/MEDQ  D:  09/23/2010  T:  09/23/2010  Job:  [id number]  cc:   Deirdre Peer. Polite, M.D. Fax: [phone]  Electronically Signed by Danise Edge M.D. on 10/03/2010 01:44:24 PM

## 2010-10-06 ENCOUNTER — Ambulatory Visit (HOSPITAL_COMMUNITY)
Admission: RE | Admit: 2010-10-06 | Discharge: 2010-10-06 | Disposition: A | Discharge status: Home / Self Care | Payer: Medicare Other | Source: Ambulatory Visit | Attending: Interventional Radiology | Admitting: Interventional Radiology

## 2010-10-06 ENCOUNTER — Ambulatory Visit
Admission: RE | Admit: 2010-10-06 | Discharge: 2010-10-06 | Disposition: A | Discharge status: Home / Self Care | Payer: Medicare Other | Source: Ambulatory Visit | Attending: Interventional Radiology | Admitting: Interventional Radiology

## 2010-10-06 ENCOUNTER — Other Ambulatory Visit: Payer: Self-pay | Admitting: Interventional Radiology

## 2010-10-06 ENCOUNTER — Encounter (HOSPITAL_COMMUNITY): Payer: Self-pay

## 2010-10-06 ENCOUNTER — Other Ambulatory Visit (HOSPITAL_COMMUNITY): Payer: Self-pay | Admitting: Interventional Radiology

## 2010-10-06 VITALS — BP 181/80 | HR 54 | Temp 98.1°F | Resp 15 | Ht 69.5 in | Wt 210.0 lb

## 2010-10-06 DIAGNOSIS — N2889 Other specified disorders of kidney and ureter: Secondary | ICD-10-CM

## 2010-10-06 DIAGNOSIS — N200 Calculus of kidney: Secondary | ICD-10-CM | POA: Insufficient documentation

## 2010-10-06 DIAGNOSIS — M47817 Spondylosis without myelopathy or radiculopathy, lumbosacral region: Secondary | ICD-10-CM | POA: Insufficient documentation

## 2010-10-06 DIAGNOSIS — D4112 Neoplasm of uncertain behavior of left renal pelvis: Secondary | ICD-10-CM

## 2010-10-06 DIAGNOSIS — N289 Disorder of kidney and ureter, unspecified: Secondary | ICD-10-CM | POA: Insufficient documentation

## 2010-10-06 DIAGNOSIS — Z09 Encounter for follow-up examination after completed treatment for conditions other than malignant neoplasm: Secondary | ICD-10-CM | POA: Insufficient documentation

## 2010-10-06 DIAGNOSIS — D49519 Neoplasm of unspecified behavior of unspecified kidney: Secondary | ICD-10-CM

## 2010-10-06 DIAGNOSIS — K8020 Calculus of gallbladder without cholecystitis without obstruction: Secondary | ICD-10-CM | POA: Insufficient documentation

## 2010-10-06 HISTORY — DX: Malignant (primary) neoplasm, unspecified: C80.1

## 2010-10-06 MED ORDER — IOHEXOL 300 MG/ML  SOLN
125.0000 mL | Freq: Once | INTRAMUSCULAR | Status: AC | PRN
  Administered 2010-10-06: 125 mL via INTRAVENOUS

## 2010-10-06 NOTE — Progress Notes (Signed)
Denies hematuria.  Does experience urinary frequency.  Medications include diruretics (HCTZ & Lasix).  Denies left flank discomfort.  Appetite good.   Sleeping well.  Able to complete some ADL's.  Most actitivities accomplished by sitting.  Has difficulty standing.  Ambulates w/ cane.

## 2010-12-07 ENCOUNTER — Other Ambulatory Visit: Payer: Self-pay | Admitting: Dermatology

## 2011-02-24 ENCOUNTER — Other Ambulatory Visit: Payer: Self-pay | Admitting: Emergency Medicine

## 2011-02-24 DIAGNOSIS — D49519 Neoplasm of unspecified behavior of unspecified kidney: Secondary | ICD-10-CM

## 2011-03-03 ENCOUNTER — Other Ambulatory Visit: Payer: Self-pay | Admitting: Interventional Radiology

## 2011-03-04 LAB — CREATININE WITH EST GFR
GFR, Est African American: 85 mL/min
GFR, Est Non African American: 73 mL/min

## 2011-03-04 LAB — BUN: BUN: 16 mg/dL (ref 6–23)

## 2011-03-08 ENCOUNTER — Ambulatory Visit (HOSPITAL_COMMUNITY)
Admission: RE | Admit: 2011-03-08 | Discharge: 2011-03-08 | Disposition: A | Discharge status: Home / Self Care | Payer: Medicare Other | Source: Ambulatory Visit | Attending: Interventional Radiology | Admitting: Interventional Radiology

## 2011-03-08 ENCOUNTER — Ambulatory Visit
Admission: RE | Admit: 2011-03-08 | Discharge: 2011-03-08 | Disposition: A | Discharge status: Home / Self Care | Payer: Medicare Other | Source: Ambulatory Visit | Attending: Interventional Radiology | Admitting: Interventional Radiology

## 2011-03-08 VITALS — BP 136/72 | HR 53 | Temp 98.5°F | Resp 18 | Ht 69.0 in | Wt 215.0 lb

## 2011-03-08 DIAGNOSIS — D4959 Neoplasm of unspecified behavior of other genitourinary organ: Secondary | ICD-10-CM | POA: Insufficient documentation

## 2011-03-08 DIAGNOSIS — I2510 Atherosclerotic heart disease of native coronary artery without angina pectoris: Secondary | ICD-10-CM | POA: Insufficient documentation

## 2011-03-08 DIAGNOSIS — N200 Calculus of kidney: Secondary | ICD-10-CM | POA: Insufficient documentation

## 2011-03-08 DIAGNOSIS — Q619 Cystic kidney disease, unspecified: Secondary | ICD-10-CM | POA: Insufficient documentation

## 2011-03-08 DIAGNOSIS — D49519 Neoplasm of unspecified behavior of unspecified kidney: Secondary | ICD-10-CM

## 2011-03-08 DIAGNOSIS — J984 Other disorders of lung: Secondary | ICD-10-CM | POA: Insufficient documentation

## 2011-03-08 DIAGNOSIS — K5730 Diverticulosis of large intestine without perforation or abscess without bleeding: Secondary | ICD-10-CM | POA: Insufficient documentation

## 2011-03-08 DIAGNOSIS — K8020 Calculus of gallbladder without cholecystitis without obstruction: Secondary | ICD-10-CM | POA: Insufficient documentation

## 2011-03-08 MED ORDER — IOHEXOL 300 MG/ML  SOLN
100.0000 mL | Freq: Once | INTRAMUSCULAR | Status: AC | PRN
  Administered 2011-03-08: 100 mL via INTRAVENOUS

## 2011-03-08 NOTE — Progress Notes (Signed)
Pt denies hematuria or problems w/ urination.

## 2011-06-07 ENCOUNTER — Other Ambulatory Visit: Payer: Self-pay | Admitting: Dermatology

## 2011-12-23 ENCOUNTER — Ambulatory Visit
Admission: RE | Admit: 2011-12-23 | Discharge: 2011-12-23 | Disposition: A | Discharge status: Home / Self Care | Payer: Medicare Other | Source: Ambulatory Visit | Attending: Internal Medicine | Admitting: Internal Medicine

## 2011-12-23 ENCOUNTER — Other Ambulatory Visit: Payer: Self-pay | Admitting: Internal Medicine

## 2011-12-23 DIAGNOSIS — R609 Edema, unspecified: Secondary | ICD-10-CM

## 2012-02-23 ENCOUNTER — Other Ambulatory Visit (HOSPITAL_COMMUNITY): Payer: Self-pay | Admitting: Interventional Radiology

## 2012-02-23 DIAGNOSIS — N2889 Other specified disorders of kidney and ureter: Secondary | ICD-10-CM

## 2012-02-28 ENCOUNTER — Other Ambulatory Visit: Payer: Self-pay | Admitting: Radiology

## 2012-03-14 LAB — CREATININE WITH EST GFR
Creat: 1.14 mg/dL (ref 0.50–1.35)
GFR, Est African American: 71 mL/min
GFR, Est Non African American: 61 mL/min

## 2012-03-27 ENCOUNTER — Ambulatory Visit: Admission: RE | Admit: 2012-03-27 | Payer: Medicare Other | Source: Ambulatory Visit

## 2012-03-27 ENCOUNTER — Ambulatory Visit (HOSPITAL_COMMUNITY)
Admission: RE | Admit: 2012-03-27 | Discharge: 2012-03-27 | Disposition: A | Discharge status: Home / Self Care | Payer: Medicare Other | Source: Ambulatory Visit | Attending: Interventional Radiology | Admitting: Interventional Radiology

## 2012-03-27 DIAGNOSIS — I77811 Abdominal aortic ectasia: Secondary | ICD-10-CM | POA: Insufficient documentation

## 2012-03-27 DIAGNOSIS — N289 Disorder of kidney and ureter, unspecified: Secondary | ICD-10-CM | POA: Insufficient documentation

## 2012-03-27 DIAGNOSIS — C649 Malignant neoplasm of unspecified kidney, except renal pelvis: Secondary | ICD-10-CM | POA: Insufficient documentation

## 2012-03-27 DIAGNOSIS — K5730 Diverticulosis of large intestine without perforation or abscess without bleeding: Secondary | ICD-10-CM | POA: Insufficient documentation

## 2012-03-27 DIAGNOSIS — I2510 Atherosclerotic heart disease of native coronary artery without angina pectoris: Secondary | ICD-10-CM | POA: Insufficient documentation

## 2012-03-27 DIAGNOSIS — K8020 Calculus of gallbladder without cholecystitis without obstruction: Secondary | ICD-10-CM | POA: Insufficient documentation

## 2012-03-27 DIAGNOSIS — N2889 Other specified disorders of kidney and ureter: Secondary | ICD-10-CM

## 2012-03-27 MED ORDER — IOHEXOL 300 MG/ML  SOLN
100.0000 mL | Freq: Once | INTRAMUSCULAR | Status: AC | PRN
  Administered 2012-03-27: 100 mL via INTRAVENOUS

## 2012-04-12 ENCOUNTER — Ambulatory Visit
Admission: RE | Admit: 2012-04-12 | Discharge: 2012-04-12 | Disposition: A | Discharge status: Home / Self Care | Payer: Medicare Other | Source: Ambulatory Visit | Attending: Interventional Radiology | Admitting: Interventional Radiology

## 2012-04-12 DIAGNOSIS — N2889 Other specified disorders of kidney and ureter: Secondary | ICD-10-CM

## 2012-04-12 NOTE — Progress Notes (Signed)
NO COMPLAINTS, 68YRS POST RENAL CRYOABLATION.  Jari Sportsman, EMT 04/12/2012 4:38 PM

## 2012-04-13 NOTE — Progress Notes (Signed)
Patient ID: Arthur Wood, male   DOB: November 21, 1931, 79 y.o.   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  History: Arthur Wood returns for follow-up. He has been doing well with no complaints.  Review of Systems: No fever or chills. No abdominal or flank pain. No hematuria or dysuria.  Exam: Vital signs: Blood pressure 154/77, pulse 51, respirations 12, temperature 98, oxygen saturation 97% on room air. General: No acute distress. Abdomen: Soft and nontender. No flank tenderness.  Labs: Creatinine 1.14 and estimated GFR 61 ml/minute.  Imaging: Follow-up CT was performed on 03/27/2012. This demonstrates stable ablation defect at the level of the treated left upper pole renal lesion with no evidence of abnormal enhancement. No new renal lesions are identified. There is a stable left renal cyst. Relatively high density upper pole right renal cyst shows slightly increased size. This shows no enhancement.  Assessment and Plan: No evidence of recurrent left renal neoplasm 2 years after percutaneous cryoablation. The patient is doing well. I have recommended another follow-up scan in 1 year.

## 2013-03-14 ENCOUNTER — Other Ambulatory Visit (HOSPITAL_COMMUNITY): Payer: Self-pay | Admitting: Interventional Radiology

## 2013-03-14 ENCOUNTER — Other Ambulatory Visit: Payer: Self-pay | Admitting: Radiology

## 2013-03-14 DIAGNOSIS — N2889 Other specified disorders of kidney and ureter: Secondary | ICD-10-CM

## 2013-03-26 LAB — CREATININE WITH EST GFR
Creat: 0.93 mg/dL (ref 0.50–1.35)
GFR, EST NON AFRICAN AMERICAN: 78 mL/min
GFR, Est African American: >89 mL/min

## 2013-03-26 LAB — BUN: BUN: 15 mg/dL (ref 6–23)

## 2013-04-03 ENCOUNTER — Ambulatory Visit
Admission: RE | Admit: 2013-04-03 | Discharge: 2013-04-03 | Disposition: A | Discharge status: Home / Self Care | Payer: Medicare Other | Source: Ambulatory Visit | Attending: Interventional Radiology | Admitting: Interventional Radiology

## 2013-04-03 ENCOUNTER — Ambulatory Visit (HOSPITAL_COMMUNITY)
Admission: RE | Admit: 2013-04-03 | Discharge: 2013-04-03 | Disposition: A | Discharge status: Home / Self Care | Payer: Medicare Other | Source: Ambulatory Visit | Attending: Interventional Radiology | Admitting: Interventional Radiology

## 2013-04-03 ENCOUNTER — Encounter (HOSPITAL_COMMUNITY): Payer: Self-pay

## 2013-04-03 DIAGNOSIS — N2889 Other specified disorders of kidney and ureter: Secondary | ICD-10-CM

## 2013-04-03 DIAGNOSIS — I7090 Unspecified atherosclerosis: Secondary | ICD-10-CM | POA: Insufficient documentation

## 2013-04-03 DIAGNOSIS — N289 Disorder of kidney and ureter, unspecified: Secondary | ICD-10-CM | POA: Insufficient documentation

## 2013-04-03 DIAGNOSIS — N281 Cyst of kidney, acquired: Secondary | ICD-10-CM | POA: Insufficient documentation

## 2013-04-03 DIAGNOSIS — K8020 Calculus of gallbladder without cholecystitis without obstruction: Secondary | ICD-10-CM | POA: Insufficient documentation

## 2013-04-03 MED ORDER — IOHEXOL 300 MG/ML  SOLN
100.0000 mL | Freq: Once | INTRAMUSCULAR | Status: AC | PRN
  Administered 2013-04-03: 100 mL via INTRAVENOUS

## 2013-04-03 MED ORDER — IOHEXOL 300 MG/ML  SOLN: 100.0000 mL | Freq: Once | INTRAMUSCULAR | Status: DC | PRN

## 2014-03-13 ENCOUNTER — Other Ambulatory Visit (HOSPITAL_COMMUNITY): Payer: Self-pay | Admitting: Interventional Radiology

## 2014-03-13 ENCOUNTER — Other Ambulatory Visit: Payer: Self-pay | Admitting: *Deleted

## 2014-03-13 DIAGNOSIS — Z8051 Family history of malignant neoplasm of kidney: Secondary | ICD-10-CM

## 2014-03-13 DIAGNOSIS — N2889 Other specified disorders of kidney and ureter: Secondary | ICD-10-CM

## 2014-04-01 LAB — CREATININE WITH EST GFR
Creat: 0.87 mg/dL (ref 0.50–1.35)
GFR, EST NON AFRICAN AMERICAN: 82 mL/min
GFR, Est African American: >89 mL/min

## 2014-04-01 LAB — BUN: BUN: 16 mg/dL (ref 6–23)

## 2014-04-08 ENCOUNTER — Encounter (HOSPITAL_COMMUNITY): Payer: Self-pay

## 2014-04-08 ENCOUNTER — Ambulatory Visit
Admission: RE | Admit: 2014-04-08 | Discharge: 2014-04-08 | Disposition: A | Discharge status: Home / Self Care | Payer: Medicare Other | Source: Ambulatory Visit | Attending: Interventional Radiology | Admitting: Interventional Radiology

## 2014-04-08 ENCOUNTER — Ambulatory Visit (HOSPITAL_COMMUNITY)
Admission: RE | Admit: 2014-04-08 | Discharge: 2014-04-08 | Disposition: A | Discharge status: Home / Self Care | Payer: Medicare Other | Source: Ambulatory Visit | Attending: Interventional Radiology | Admitting: Interventional Radiology

## 2014-04-08 DIAGNOSIS — C642 Malignant neoplasm of left kidney, except renal pelvis: Secondary | ICD-10-CM | POA: Insufficient documentation

## 2014-04-08 DIAGNOSIS — Z08 Encounter for follow-up examination after completed treatment for malignant neoplasm: Secondary | ICD-10-CM | POA: Insufficient documentation

## 2014-04-08 DIAGNOSIS — N2889 Other specified disorders of kidney and ureter: Secondary | ICD-10-CM

## 2014-04-08 DIAGNOSIS — K8020 Calculus of gallbladder without cholecystitis without obstruction: Secondary | ICD-10-CM | POA: Diagnosis not present

## 2014-04-08 MED ORDER — IOHEXOL 300 MG/ML  SOLN
100.0000 mL | Freq: Once | INTRAMUSCULAR | Status: AC | PRN
  Administered 2014-04-08: 100 mL via INTRAVENOUS

## 2014-04-08 NOTE — Progress Notes (Signed)
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  History of Present Illness: Arthur Wood is a 81 y.o. male status post left renal cryoablation 4 years ago. The patient has done well over the last year with no significant complaints.  Past Medical History  Diagnosis Date  . Atrial fibrillation   . Asthma   . Hypertension   . lt renal ca dx'd 03/2010    No past surgical history on file.  Allergies: Review of patient's allergies indicates no known allergies.  Medications: Prior to Admission medications   Medication Sig Start Date End Date Taking? Authorizing Provider  cloNIDine (CATAPRES) 0.1 MG tablet Take 0.1 mg by mouth daily.     Historical Provider, MD  clopidogrel (PLAVIX) 75 MG tablet Take 75 mg by mouth daily.      Historical Provider, MD  furosemide (LASIX) 20 MG tablet Take 20 mg by mouth daily.      Historical Provider, MD  lisinopril (PRINIVIL,ZESTRIL) 40 MG tablet Take 40 mg by mouth daily.      Historical Provider, MD  NIFEdipine (PROCARDIA XL) 60 MG 24 hr tablet Take 60 mg by mouth daily.      Historical Provider, MD  PHENobarbital (LUMINAL) 30 MG tablet Take 90 mg by mouth daily.      Historical Provider, MD  potassium chloride (KLOR-CON) 20 MEQ packet Take 20 mEq by mouth daily.      Historical Provider, MD  rosuvastatin (CRESTOR) 5 MG tablet Take 5 mg by mouth daily.      Historical Provider, MD    No family history on file.  History   Social History  . Marital Status: Married    Spouse Name: N/A  . Number of Children: N/A  . Years of Education: N/A   Occupational History  . RETIRED    Social History Main Topics  . Smoking status: Former Smoker    Types: Cigarettes    Quit date: 02/11/1983  . Smokeless tobacco: Not on file  . Alcohol Use: No  . Drug Use: No  . Sexual Activity: Not on file   Other Topics Concern  . Not on file   Social History Narrative     Review of Systems: A 12 point ROS  discussed and pertinent positives are indicated in the HPI above.  All other systems are negative.  Review of Systems  Constitutional: Negative.   Respiratory: Negative.   Cardiovascular: Negative.   Gastrointestinal: Negative.   Genitourinary: Negative.   Neurological: Negative.      Vital Signs: BP 186/102 mmHg  Pulse 89  Temp(Src) 97.6 F (36.4 C)  Resp 20  SpO2 97%  Physical Exam  Constitutional: He is oriented to person, place, and time. He appears well-developed and well-nourished. No distress.  Abdominal: Soft. Bowel sounds are normal. He exhibits no distension and no mass. There is no tenderness. There is no rebound and no guarding.  Neurological: He is alert and oriented to person, place, and time.  Skin: He is not diaphoretic.  Nursing note and vitals reviewed.   Imaging: Ct Abd Wo & W Cm  04/08/2014   CLINICAL DATA:  Patient for follow-up status post cryoablation left renal cell neoplasm.  EXAM: CT ABDOMEN WITHOUT AND WITH CONTRAST  TECHNIQUE: Multidetector CT imaging of the abdomen was performed following the standard protocol before and following the bolus administration of intravenous contrast.  CONTRAST:  134mL OMNIPAQUE IOHEXOL 300 MG/ML  SOLN  COMPARISON:  CT  04/03/2013; 03/27/2012  FINDINGS: Lower chest: Minimal dependent atelectasis. No large consolidative pulmonary opacities.  Hepatobiliary: Liver is normal in size and contour without focal hepatic lesion identified. Multiple stones are demonstrated within the gallbladder lumen. No intrahepatic or extrahepatic biliary ductal dilatation.  Pancreas: Unremarkable  Spleen: Unremarkable  Adrenals/Urinary Tract: Normal bilateral adrenal glands. Stable postprocedural changes along the medial superior left kidney with the entire lesion measuring 3.3 x 2.6 cm. Central nodularity measures 1.2 x 1.2 cm. This is unchanged from recent prior examination. No abnormal enhancing soft tissue. Unchanged exophytic 3 cm cyst off the  inferior pole of the left kidney. Re- demonstrated probable sub cm hemorrhagic cyst within the anterior lower pole of the right kidney. Multiple additional sub cm bilateral low-attenuation renal lesions are stable. Unchanged probable nonobstructing stones interpolar region right kidney.  Stomach/Bowel: No abnormal bowel wall thickening or evidence for bowel obstruction. No free fluid or free intraperitoneal air.  Vascular/Lymphatic: Unchanged fusiform ectasia of the infrarenal abdominal aorta measuring 2.5 cm. No retroperitoneal adenopathy.  Other: None  Musculoskeletal: No aggressive for acute appearing osseous lesions.  IMPRESSION: Stable postprocedural changes compatible with upper pole ablation involving the left kidney. No evidence to suggest local recurrence.  Multiple stable small bilateral renal lesions, largest of which are compatible with simple or proteinaceous cysts.  Cholelithiasis.   Electronically Signed   By: Lovey Newcomer M.D.   On: 04/08/2014 09:31    Labs:  CBC: No results for input(s): WBC, HGB, HCT, PLT in the last 8760 hours.  COAGS: No results for input(s): INR, APTT in the last 8760 hours.  BMP:  Recent Labs  03/31/14 1805  BUN 16  CREATININE 0.87  GFRNONAA 82  GFRAA >89    LIVER FUNCTION TESTS: No results for input(s): BILITOT, AST, ALT, ALKPHOS, PROT, ALBUMIN in the last 8760 hours.  TUMOR MARKERS: No results for input(s): AFPTM, CEA, CA199, CHROMGRNA in the last 8760 hours.  Assessment and Plan:  Stable left renal ablation defect 4 years after cryoablation of an oncocytic neoplasm. There is no evidence of tumor recurrence or complication. I have recommended that we cease routine imaging of the abdomen given extremely low risk of tumor recurrence at this point. No new renal lesions are identified. Renal function is stable and normal. I told Arthur Wood and his wife to certainly contact me if he has any concerning future symptoms or  questions.  SignedAletta Edouard T 04/08/2014, 12:33 PM     I spent a total of 15 minutes face to face in clinical consultation, greater than 50% of which was counseling/coordinating care post cryoablation of left renal neoplasm.

## 2015-03-15 ENCOUNTER — Inpatient Hospital Stay (HOSPITAL_COMMUNITY): Payer: Medicare Other

## 2015-03-15 ENCOUNTER — Encounter (HOSPITAL_COMMUNITY): Payer: Self-pay | Admitting: *Deleted

## 2015-03-15 ENCOUNTER — Emergency Department (HOSPITAL_COMMUNITY): Payer: Medicare Other

## 2015-03-15 ENCOUNTER — Inpatient Hospital Stay (HOSPITAL_COMMUNITY)
Admission: EM | Admit: 2015-03-15 | Discharge: 2015-03-19 | DRG: 100 | Disposition: A | Discharge status: Skilled Nursing Facility | Payer: Medicare Other | Attending: Internal Medicine | Admitting: Internal Medicine

## 2015-03-15 DIAGNOSIS — G936 Cerebral edema: Secondary | ICD-10-CM | POA: Diagnosis present

## 2015-03-15 DIAGNOSIS — N39498 Other specified urinary incontinence: Secondary | ICD-10-CM | POA: Diagnosis present

## 2015-03-15 DIAGNOSIS — G9389 Other specified disorders of brain: Secondary | ICD-10-CM | POA: Diagnosis present

## 2015-03-15 DIAGNOSIS — R569 Unspecified convulsions: Secondary | ICD-10-CM

## 2015-03-15 DIAGNOSIS — N401 Enlarged prostate with lower urinary tract symptoms: Secondary | ICD-10-CM | POA: Diagnosis present

## 2015-03-15 DIAGNOSIS — J449 Chronic obstructive pulmonary disease, unspecified: Secondary | ICD-10-CM | POA: Diagnosis present

## 2015-03-15 DIAGNOSIS — Z79899 Other long term (current) drug therapy: Secondary | ICD-10-CM | POA: Diagnosis not present

## 2015-03-15 DIAGNOSIS — K4090 Unilateral inguinal hernia, without obstruction or gangrene, not specified as recurrent: Secondary | ICD-10-CM | POA: Diagnosis present

## 2015-03-15 DIAGNOSIS — J45909 Unspecified asthma, uncomplicated: Secondary | ICD-10-CM | POA: Diagnosis not present

## 2015-03-15 DIAGNOSIS — G9340 Encephalopathy, unspecified: Secondary | ICD-10-CM | POA: Diagnosis not present

## 2015-03-15 DIAGNOSIS — I4891 Unspecified atrial fibrillation: Secondary | ICD-10-CM | POA: Diagnosis present

## 2015-03-15 DIAGNOSIS — Z8673 Personal history of transient ischemic attack (TIA), and cerebral infarction without residual deficits: Secondary | ICD-10-CM

## 2015-03-15 DIAGNOSIS — G40909 Epilepsy, unspecified, not intractable, without status epilepticus: Secondary | ICD-10-CM | POA: Diagnosis present

## 2015-03-15 DIAGNOSIS — I10 Essential (primary) hypertension: Secondary | ICD-10-CM | POA: Diagnosis present

## 2015-03-15 DIAGNOSIS — Z7902 Long term (current) use of antithrombotics/antiplatelets: Secondary | ICD-10-CM

## 2015-03-15 DIAGNOSIS — R4182 Altered mental status, unspecified: Secondary | ICD-10-CM | POA: Diagnosis not present

## 2015-03-15 DIAGNOSIS — G459 Transient cerebral ischemic attack, unspecified: Secondary | ICD-10-CM | POA: Diagnosis present

## 2015-03-15 DIAGNOSIS — R400 Somnolence: Secondary | ICD-10-CM | POA: Diagnosis not present

## 2015-03-15 DIAGNOSIS — D329 Benign neoplasm of meninges, unspecified: Secondary | ICD-10-CM | POA: Diagnosis present

## 2015-03-15 DIAGNOSIS — Z85528 Personal history of other malignant neoplasm of kidney: Secondary | ICD-10-CM

## 2015-03-15 DIAGNOSIS — I480 Paroxysmal atrial fibrillation: Secondary | ICD-10-CM

## 2015-03-15 DIAGNOSIS — Z87891 Personal history of nicotine dependence: Secondary | ICD-10-CM

## 2015-03-15 HISTORY — DX: Unspecified convulsions: R56.9

## 2015-03-15 HISTORY — DX: Cerebral infarction, unspecified: I63.9

## 2015-03-15 LAB — COMPREHENSIVE METABOLIC PANEL
ALBUMIN: 3.9 g/dL (ref 3.5–5.0)
ALK PHOS: 80 U/L (ref 38–126)
ALK PHOS: 81 U/L (ref 38–126)
ALT: 15 U/L — ABNORMAL LOW (ref 17–63)
ALT: 15 U/L — AB (ref 17–63)
ANION GAP: 13 (ref 5–15)
AST: 22 U/L (ref 15–41)
AST: 19 U/L (ref 15–41)
Albumin: 3.7 g/dL (ref 3.5–5.0)
Anion gap: 11 (ref 5–15)
BUN: 16 mg/dL (ref 6–20)
BUN: 13 mg/dL (ref 6–20)
CALCIUM: 9 mg/dL (ref 8.9–10.3)
CHLORIDE: 105 mmol/L (ref 101–111)
CO2: 28 mmol/L (ref 22–32)
CO2: 27 mmol/L (ref 22–32)
CREATININE: 0.82 mg/dL (ref 0.61–1.24)
Calcium: 8.9 mg/dL (ref 8.9–10.3)
Chloride: 102 mmol/L (ref 101–111)
Creatinine, Ser: 0.92 mg/dL (ref 0.61–1.24)
GFR calc Af Amer: >60 mL/min (ref >60)
GFR calc non Af Amer: >60 mL/min (ref >60)
GLUCOSE: 120 mg/dL — AB (ref 65–99)
Glucose, Bld: 119 mg/dL — ABNORMAL HIGH (ref 65–99)
POTASSIUM: 4.5 mmol/L (ref 3.5–5.1)
Potassium: 4 mmol/L (ref 3.5–5.1)
SODIUM: 143 mmol/L (ref 135–145)
Sodium: 143 mmol/L (ref 135–145)
TOTAL PROTEIN: 6.4 g/dL — AB (ref 6.5–8.1)
Total Bilirubin: 0.4 mg/dL (ref 0.3–1.2)
Total Bilirubin: 0.7 mg/dL (ref 0.3–1.2)
Total Protein: 6.2 g/dL — ABNORMAL LOW (ref 6.5–8.1)

## 2015-03-15 LAB — CBC WITH DIFFERENTIAL/PLATELET
BASOS PCT: 1 %
Basophils Absolute: 0 10*3/uL (ref 0.0–0.1)
EOS ABS: 0.3 10*3/uL (ref 0.0–0.7)
EOS PCT: 6 %
HCT: 41.9 % (ref 39.0–52.0)
Hemoglobin: 13.7 g/dL (ref 13.0–17.0)
LYMPHS ABS: 2 10*3/uL (ref 0.7–4.0)
Lymphocytes Relative: 33 %
MCH: 30 pg (ref 26.0–34.0)
MCHC: 32.7 g/dL (ref 30.0–36.0)
MCV: 91.9 fL (ref 78.0–100.0)
MONO ABS: 0.6 10*3/uL (ref 0.1–1.0)
MONOS PCT: 10 %
Neutro Abs: 3 10*3/uL (ref 1.7–7.7)
Neutrophils Relative %: 50 %
Platelets: 166 10*3/uL (ref 150–400)
RBC: 4.56 MIL/uL (ref 4.22–5.81)
RDW: 14.4 % (ref 11.5–15.5)
WBC: 5.9 10*3/uL (ref 4.0–10.5)

## 2015-03-15 LAB — URINALYSIS, ROUTINE W REFLEX MICROSCOPIC
BILIRUBIN URINE: NEGATIVE
GLUCOSE, UA: NEGATIVE mg/dL
Hgb urine dipstick: NEGATIVE
KETONES UR: NEGATIVE mg/dL
Leukocytes, UA: NEGATIVE
Nitrite: NEGATIVE
PH: 7 (ref 5.0–8.0)
Protein, ur: NEGATIVE mg/dL
Specific Gravity, Urine: 1.009 (ref 1.005–1.030)

## 2015-03-15 LAB — TROPONIN I

## 2015-03-15 LAB — HEPATIC FUNCTION PANEL
ALK PHOS: 80 U/L (ref 25–125)
ALT: 15 U/L (ref 10–40)
AST: 22 U/L (ref 14–40)
Bilirubin, Total: 0.4 mg/dL

## 2015-03-15 LAB — CBC AND DIFFERENTIAL
HCT: 42 % (ref 41–53)
HEMOGLOBIN: 13.7 g/dL (ref 13.5–17.5)
Platelets: 166 10*3/uL (ref 150–399)
WBC: 5.9 10*3/mL

## 2015-03-15 LAB — BASIC METABOLIC PANEL
BUN: 16 mg/dL (ref 4–21)
Creatinine: 0.9 mg/dL (ref 0.6–1.3)
Glucose: 120 mg/dL
POTASSIUM: 4.5 mmol/L (ref 3.4–5.3)
SODIUM: 143 mmol/L (ref 137–147)

## 2015-03-15 LAB — CBC
HCT: 42 % (ref 39.0–52.0)
Hemoglobin: 14.1 g/dL (ref 13.0–17.0)
MCH: 30.6 pg (ref 26.0–34.0)
MCHC: 33.6 g/dL (ref 30.0–36.0)
MCV: 91.1 fL (ref 78.0–100.0)
PLATELETS: 162 10*3/uL (ref 150–400)
RBC: 4.61 MIL/uL (ref 4.22–5.81)
RDW: 14.3 % (ref 11.5–15.5)
WBC: 6.2 10*3/uL (ref 4.0–10.5)

## 2015-03-15 LAB — RAPID URINE DRUG SCREEN, HOSP PERFORMED
Amphetamines: NOT DETECTED
Barbiturates: POSITIVE — AB
Benzodiazepines: NOT DETECTED
Cocaine: NOT DETECTED
Opiates: NOT DETECTED
Tetrahydrocannabinol: NOT DETECTED

## 2015-03-15 LAB — PROTIME-INR
INR: 1.07 (ref 0.00–1.49)
PROTHROMBIN TIME: 14.1 s (ref 11.6–15.2)

## 2015-03-15 LAB — TSH: TSH: 2.49 u[IU]/mL (ref 0.350–4.500)

## 2015-03-15 LAB — AMMONIA: Ammonia: 26 umol/L (ref 9–35)

## 2015-03-15 LAB — PHENOBARBITAL LEVEL: Phenobarbital: 13.7 ug/mL — ABNORMAL LOW (ref 15.0–40.0)

## 2015-03-15 LAB — VITAMIN B12: VITAMIN B 12: 474 pg/mL (ref 180–914)

## 2015-03-15 LAB — I-STAT CG4 LACTIC ACID, ED
LACTIC ACID, VENOUS: 0.72 mmol/L (ref 0.5–2.0)
Lactic Acid, Venous: 0.9 mmol/L (ref 0.5–2.0)

## 2015-03-15 MED ORDER — SODIUM CHLORIDE 0.9% FLUSH
3.0000 mL | Freq: Two times a day (BID) | INTRAVENOUS | Status: DC
  Administered 2015-03-15 – 2015-03-19 (×6): 3 mL via INTRAVENOUS

## 2015-03-15 MED ORDER — DEXTROSE 5 % IV SOLN
800.0000 mg | Freq: Once | INTRAVENOUS | Status: AC
  Administered 2015-03-15: 800 mg via INTRAVENOUS
  Filled 2015-03-15: qty 16

## 2015-03-15 MED ORDER — FUROSEMIDE 20 MG PO TABS
20.0000 mg | ORAL_TABLET | Freq: Every day | ORAL | Status: DC
  Administered 2015-03-15 – 2015-03-19 (×5): 20 mg via ORAL
  Filled 2015-03-15 (×5): qty 1

## 2015-03-15 MED ORDER — CLOPIDOGREL BISULFATE 75 MG PO TABS
75.0000 mg | ORAL_TABLET | Freq: Every day | ORAL | Status: DC
  Administered 2015-03-15 – 2015-03-19 (×5): 75 mg via ORAL
  Filled 2015-03-15 (×5): qty 1

## 2015-03-15 MED ORDER — ROSUVASTATIN CALCIUM 10 MG PO TABS
5.0000 mg | ORAL_TABLET | Freq: Every day | ORAL | Status: DC
  Administered 2015-03-15 – 2015-03-18 (×4): 5 mg via ORAL
  Filled 2015-03-15 (×6): qty 1

## 2015-03-15 MED ORDER — ONDANSETRON HCL 4 MG PO TABS: 4.0000 mg | ORAL_TABLET | Freq: Four times a day (QID) | ORAL | Status: DC | PRN

## 2015-03-15 MED ORDER — LISINOPRIL 40 MG PO TABS
40.0000 mg | ORAL_TABLET | Freq: Every day | ORAL | Status: DC
  Administered 2015-03-15 – 2015-03-19 (×5): 40 mg via ORAL
  Filled 2015-03-15: qty 2
  Filled 2015-03-15 (×4): qty 1

## 2015-03-15 MED ORDER — SODIUM CHLORIDE 0.9% FLUSH: 3.0000 mL | INTRAVENOUS | Status: DC | PRN

## 2015-03-15 MED ORDER — DEXTROSE 5 % IV SOLN
700.0000 mg | Freq: Three times a day (TID) | INTRAVENOUS | Status: DC
  Administered 2015-03-15 – 2015-03-16 (×2): 700 mg via INTRAVENOUS
  Filled 2015-03-15 (×4): qty 14

## 2015-03-15 MED ORDER — DEXTROSE 5 % IV SOLN
800.0000 mg | Freq: Once | INTRAVENOUS | Status: DC
  Filled 2015-03-15: qty 16

## 2015-03-15 MED ORDER — ONDANSETRON HCL 4 MG/2ML IJ SOLN: 4.0000 mg | Freq: Four times a day (QID) | INTRAMUSCULAR | Status: DC | PRN

## 2015-03-15 MED ORDER — POTASSIUM CHLORIDE ER 10 MEQ PO TBCR
20.0000 meq | EXTENDED_RELEASE_TABLET | Freq: Every day | ORAL | Status: DC
  Administered 2015-03-15 – 2015-03-19 (×5): 20 meq via ORAL
  Filled 2015-03-15 (×13): qty 2

## 2015-03-15 MED ORDER — PHENOBARBITAL 97.2 MG PO TABS
97.2000 mg | ORAL_TABLET | Freq: Every day | ORAL | Status: DC
  Administered 2015-03-15 – 2015-03-18 (×4): 97.2 mg via ORAL
  Filled 2015-03-15 (×4): qty 1

## 2015-03-15 MED ORDER — GADOBENATE DIMEGLUMINE 529 MG/ML IV SOLN
20.0000 mL | Freq: Once | INTRAVENOUS | Status: AC | PRN
  Administered 2015-03-15: 20 mL via INTRAVENOUS

## 2015-03-15 MED ORDER — SODIUM CHLORIDE 0.9 % IV SOLN: 250.0000 mL | INTRAVENOUS | Status: DC | PRN

## 2015-03-15 MED ORDER — MORPHINE SULFATE (PF) 2 MG/ML IV SOLN: 1.0000 mg | INTRAVENOUS | Status: DC | PRN

## 2015-03-15 MED ORDER — HEPARIN BOLUS VIA INFUSION: 4000.0000 [IU] | Freq: Once | INTRAVENOUS | Status: DC

## 2015-03-15 MED ORDER — PHENOBARBITAL 64.8 MG PO TABS
64.8000 mg | ORAL_TABLET | ORAL | Status: AC
  Administered 2015-03-15: 64.8 mg via ORAL
  Filled 2015-03-15: qty 1

## 2015-03-15 MED ORDER — PHENOBARBITAL 30 MG PO TABS: 90.0000 mg | ORAL_TABLET | Freq: Every day | ORAL | Status: DC

## 2015-03-15 MED ORDER — CLONIDINE HCL 0.1 MG PO TABS
0.1000 mg | ORAL_TABLET | Freq: Every day | ORAL | Status: DC
  Administered 2015-03-16 – 2015-03-19 (×4): 0.1 mg via ORAL
  Filled 2015-03-15 (×5): qty 1

## 2015-03-15 MED ORDER — NIFEDIPINE ER 60 MG PO TB24
60.0000 mg | ORAL_TABLET | Freq: Every day | ORAL | Status: DC
Start: 2015-03-15 — End: 2015-03-19
  Administered 2015-03-15 – 2015-03-19 (×4): 60 mg via ORAL
  Filled 2015-03-15 (×9): qty 1

## 2015-03-15 NOTE — Progress Notes (Signed)
RN and tech assisted patient to right side with pillow wedge support at about 2000.  At this time patient requested to be turned back to his left side.  RN explained to patient that he should have a break from his left side because when RN came on shift at 7pm that was the side patient was laying on.  Patient stated understanding and told nurse that he sleeps on his left side every night and cannot relax on his right side and requested RN help him turn back to his left side.  RN assisted patient back to his left side per patient request.

## 2015-03-15 NOTE — ED Notes (Signed)
Patient transported to MRI 

## 2015-03-15 NOTE — Progress Notes (Signed)
  This is a no charge note   Pending admission for Dr. Kingsley Callander  82 year old man with past medical history of seizure on phenobarbital, renal cell cancer, atrial fibrillation not on anticoagulants, hypertension, asthma, who presents with altered mental status and unresponsiveness.  CT-head showed  vasogenic type edema in the left temporal lobe primarily as with mass vs. infection. Given location and history, radiologist suggested empiric treatment for herpes encephalitis during workup. Neurologist was consulted, Dr. Leonel Ramsay did not think patient has a stroke, recommend an MRI. Acyclovir was ordered. Blood pressure 226/120-->176/91, no leukocytosis, temperature normal, electrolytes and renal function okay. Pt is accepted to tele bed.  Ivor Costa, MD  [facility] Pager [phone]  If 7PM-7AM, please contact night-coverage [email or web address] Password [facility] 03/15/2015, 6:46 AM

## 2015-03-15 NOTE — H&P (Signed)
[facility] History and Physical  Arthur Wood [id number] DOB: 1931/12/21 DOA: 03/15/2015  PCP: Kandice Hams, MD   Chief Complaint: Altered Mental Status  HPI: Arthur Wood is a 82 y.o. 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 Around 10 pm, accompanied by progressive lethargy. Per wife report, last seen interactive around 1 am.No nausea or vomiting was noted. No dizziness. No vision changes. No seizures. No apparent cardiac, respiratory complaints or diaphoresis. No sick contacts. No recent travels. Had urine incontinence. Symptoms are beginning to clear, returning to his baseline.  CT head without contrast showed vasogenic type edema in the left temporal lobe primarily as with mass vs infection such as herpes encephalitis. MRI with contrast pending. Also seen, unknown age Left basal ganglia infarct and Remote left frontal infarct. Cystic encephalomalacia in the right temporal lobe  Moreover, Bilateral subdural hygroma vs. remote hematoma with mild mass effect in the right frontal region was noted. MRI brain is pending.  CXR and CT Abdomen and pelvis without contrast, no acute findings.  At the ED, Intinial BP was 226/123 now BP 176/91 mmHg, normal pulse, Afebrile and SpO2 98%. Glucose 120. CBC and CMET unremarkable. Of note, Phenobarbital levels are undertherapeutic at 13.7. Lactic acid normal at 0.9. EKG without any ST elevation. Tn <0.03 He was placed on acyclovir prophylacticaly.  Neurology to consult. He will be admitted for further workup.   Review of Systems:  Constitutional:  No weight loss, night sweats, fevers, chills, fatigue HEENT: No headaches, difficulty swallowing,tooth/dental problems, sore throat Cardio-vascular:  No chest pain, orthopnea, PND, swelling in lower extremities,  anasarca, dizziness, palpitations  GI:  No heartburn, indigestion, abdominal pain, nausea, vomiting, diarrhea, change in bowel habits, loss of appetite  Respiratory:  No shortness of breath with exertion or at rest. No excess mucus, no productive cough, No non-productive cough, No coughing up of blood. No change in color of mucus. No wheezing .No chest wall deformity  Skin:  No rash or lesions.  GU:  no dysuria, change in color of urine, no urgency or frequency, incontinence. No flank pain.  Musculoskeletal:   No joint pain or swelling. No decreased range of motion. No back pain.  Psych:   change in mood or affect. No depression or anxiety. No memory loss.  Neuro:  No change in sensation, unilateral strength, change in  cognitive abilities as above  All other systems were reviewed and are negative.  Past Medical History  Diagnosis Date  . Atrial fibrillation ([place])   . Asthma   . Hypertension   . lt renal ca dx'd 03/2010   History reviewed. No pertinent past surgical history. Social History:  reports that he quit smoking about 32 years ago. His smoking use included Cigarettes. He does not have any smokeless tobacco history on file. He reports that he does not drink alcohol or use illicit drugs.  No Known Allergies  No family history on file.   Prior to Admission medications   Medication Sig Start Date End Date Taking? Authorizing Provider  cloNIDine (CATAPRES) 0.1 MG tablet Take 0.1 mg by mouth daily.     Historical Provider, MD  clopidogrel (PLAVIX) 75 MG tablet Take 75 mg by mouth daily.      Historical Provider, MD  furosemide (LASIX) 20 MG tablet Take 20 mg by mouth daily.  Historical Provider, MD  lisinopril (PRINIVIL,ZESTRIL) 40 MG tablet Take 40 mg by mouth daily.      Historical Provider, MD  NIFEdipine (PROCARDIA XL) 60 MG 24 hr tablet Take 60 mg by mouth daily.      Historical Provider, MD  PHENobarbital (LUMINAL) 30 MG tablet Take 90 mg by mouth daily.       Historical Provider, MD  potassium chloride (KLOR-CON) 20 MEQ packet Take 20 mEq by mouth daily.      Historical Provider, MD  rosuvastatin (CRESTOR) 5 MG tablet Take 5 mg by mouth daily.      Historical Provider, MD   Physical Exam: Filed Vitals:   03/15/15 0430 03/15/15 0530 03/15/15 0536 03/15/15 0545  BP: 226/123 155/98  176/91  Pulse: 82   74  Temp:   97.6 F (36.4 C)   TempSrc:   Rectal   Resp: 20   25  SpO2: 98%   98%    Wt Readings from Last 3 Encounters:  03/08/11 97.523 kg (215 lb)  10/06/10 95.255 kg (210 lb)  02/10/10 95.255 kg (210 lb)    General: Appears calm and comfortable Eyes:  PERRL, EOMI, normal lids, iris ENT: grossly normal hearing, lips & tongue Neck: no lymphadenopathy, masses or thyromegaly Cardiovascular: regular rate and rhythm, occasional ectopic beat, no murmurs, rubs or gallops. +2 pitting edema Respiratory: clear to auscultation bilaterally, no wheezing, rhonhci or rales. Normal respiratory effort. Abdomen: soft,non-tender, normal bowel sounds. Known right inguinal hernia Skin: no rash or induration seen on limited exam. No open lesions. ecchymmosis L chest wall  Musculoskeletal:  grossly normal tone in both upper and lower extremities Psychiatric: grossly normal mood and affect, speech fluent and appropriate Neurologic: CN 2-12 grossly intact, moves all extremities in coordinated fashion. follows  simple commands, alert x 2,           Labs on Admission:  Basic Metabolic Panel:  Recent Labs Lab 03/15/15 0434  NA 143  K 4.5  CL 102  CO2 28  GLUCOSE 120*  BUN 16  CREATININE 0.92  CALCIUM 9.0    Liver Function Tests:  Recent Labs Lab 03/15/15 0434  AST 22  ALT 15*  ALKPHOS 80  BILITOT 0.4  PROT 6.4*  ALBUMIN 3.9   No results for input(s): LIPASE, AMYLASE in the last 168 hours.  Recent Labs Lab 03/15/15 0451  AMMONIA 26    CBC:  Recent Labs Lab 03/15/15 0434  WBC 5.9  NEUTROABS 3.0  HGB 13.7  HCT 41.9  MCV 91.9   PLT 166    Cardiac Enzymes:  Recent Labs Lab 03/15/15 0434  TROPONINI <0.03     Radiological Exams on Admission: Ct Abdomen Pelvis Wo Contrast  03/15/2015 COMPARISON:  04/08/2014 FINDINGS: Lower chest and abdominal wall: Massive right inguinal hernia containing small and large bowel which is nonobstructed. Small fatty umbilical hernia. Mild bronchial wall thickening and scarring in the right lower lobe. Hepatobiliary: No focal liver abnormality. High-density material in the gallbladder with layering calculi. Front or nodular low-density along the anterior gallbladder wall is likely cholesterolosis or floating fat and has been seen since at least 2015 CT. Pancreas: Unremarkable. Spleen: Unremarkable. Adrenals/Urinary Tract: Negative adrenals. Stable appearance of left renal mass ablation site. Small hemorrhagic cyst in the lower pole right kidney, stable. Simple density exophytic left renal cyst is also stable. Unremarkable bladder. Reproductive:Mild prostate enlargement. Stomach/Bowel: No obstruction. No appendicitis. No inflammatory bowel wall thickening. Extensive colonic diverticulosis. Vascular/Lymphatic: Diffuse atherosclerosis. No evidence  of acute vascular disease. No mass or adenopathy. Peritoneal: No ascites or pneumoperitoneum. Musculoskeletal: No acute abnormalities. IMPRESSION: 1. No acute finding. 2. Massive right inguinal hernia containing nonobstructed bowel. 3. Cholelithiasis and other stable chronic findings are described above. Electronically Signed   By: Monte Fantasia M.D.   On: 03/15/2015 05:36   Ct Head Wo Contrast  03/15/2015  COMPARISON:  None. FINDINGS: Skull and Sinuses:Remote biparietal craniotomy. Mucosal thickening focally and the upper left frontal sinus, without sinus expansion. Visualized orbits: Negative. Brain: The white matter of the left temporal lobe is low-density with sulcal effacement. Appearance suggests vasogenic edema. Appearing nodular thickening at the  anterior pole of the left temporal lobe could be normal cortex or mass. There is contiguous low-density in the left putamen and deep white matter tracts. 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 Well-defined, remote appearing low posterior left frontal cortical and subcortical infarct. No hydrocephalus or acute hemorrhage. There are CSF density subdural collections around the bilateral cerebral convexities at the vertex, measuring up to 13 mm in the right frontal region with mild cortical mass effect. These results were called by telephone at the time of interpretation on 03/15/2015 at 5:24 am to Dr. Ezequiel Essex , who verbally acknowledged these results. IMPRESSION: 1. Vasogenic type edema in the left temporal lobe primarily as with mass or infection. Given location and history, suggest empiric treatment for herpes encephalitis during workup. Recommend MRI with contrast. 2. Age-indeterminate left basal ganglia infarct. 3. Remote left frontal infarct. Cystic encephalomalacia in the right temporal lobe. 4. Bilateral subdural hygroma or remote hematoma with mild mass effect in the right frontal region. Electronically Signed   By: Monte Fantasia M.D.   On: 03/15/2015 05:30   Dg Chest Portable 1 View  03/15/2015  COMPARISON:  01/11/2010 FINDINGS: Borderline cardiomegaly is stable, accentuated by low volumes. Negative aortic and hilar contours. Right paratracheal fullness and convexity has a stable appearance since 2012 chest x-ray and thus is likely ectatic vessels. Chronic elevation the left diaphragm. There is no edema, consolidation, effusion, or pneumothorax. IMPRESSION: No acute finding.  Stable compared to 2012. Electronically Signed   By: Monte Fantasia M.D.   On: 03/15/2015 04:51    EKG:  Ventricular Rate: 83 PR Interval: 58 QRS Duration: 155 QT Interval: 441 QTC Calculation: 518 R  Axis: 76 Text Interpretation: Sinus rhythm Multiple premature complexes, vent & supraven Short PR interval Right bundle branch block Right bundle branch block Nonspecific ST abnormality   Assessment/Plan Active Problems:   Altered mental status   Atrial fibrillation (H[facility])   Hypertension   Asthma  Acute encephalopathy. Etiology unclear. No apparent seizures at this time, although Of note, Phenobarbital levels are undertherapeutic at 13.7. No CVA per CT of the head without contrast, which  showed vasogenic type edema in the left temporal lobe primarily as with mass vs infection such as herpes encephalitis. MRI with contrast pending.  No metabolic derangement. He is not hypoxic. 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 Ammonia level 26. Lactic Acid normal at 0.9 Admit to telemetry  Phenobarbital per Pharmacy Await urine drug screen Continue Acyclovir as prescribed by Neuro -Obtain vitamin B-12 RPR ammonia level, HIV antibody and TSH -Continue home meds  Atrial Fibrillation not on anticoagulants due to history of brain hemorrhage while on Coumadin in 1998  2D Echo  Continue home meds  Spoke with Pharmacy, recommending to continue Plavix   Hypertension Blood pressure quite elevated on admission now better controlled  Meds were discussed with Pharmacy Resume Catapres, Zestril, Procardia , crestor,  Continue Lasix  Add hydralazine and clonidine when necessary with parameters Monitor in  Telemetry  Asthma, likely COPD with regular episodes of bronchitis This is stable, Osats normal on RA, no acute changes. CXR NAD  History of  L RCC, not on treatment CT abdomen without new suspicious masses  Code Status: Full Code   DVT Prophylaxis: on Plavix and SCDs Family Communication:  Wife at bedside Disposition Plan: Pending Improvement. Admitted for observation in tele bed. Expected LOS 24-48 hrs    [facility] E,PA-C [facility] [email or web address] Password [email or web address]

## 2015-03-15 NOTE — ED Notes (Signed)
Dr Le at bedside.  

## 2015-03-15 NOTE — Progress Notes (Addendum)
MEDICATION RELATED CONSULT NOTE - INITIAL   Pharmacy Consult for Phenobarbital Indication: Continuing for seizure disorder  No Known Allergies  Patient Measurements:   Adjusted Body Weight: 98kg (in 03/2011)  Vital Signs: Temp: 97.6 F (36.4 C) (03/12 0536) Temp Source: Rectal (03/12 0536) BP: 176/91 mmHg (03/12 0545) Pulse Rate: 74 (03/12 0545) Intake/Output from previous day:   Intake/Output from this shift:    Labs:  Recent Labs  03/15/15 0434  WBC 5.9  HGB 13.7  HCT 41.9  PLT 166  CREATININE 0.92  ALBUMIN 3.9  PROT 6.4*  AST 22  ALT 15*  ALKPHOS 80  BILITOT 0.4   CrCl cannot be calculated (Unknown ideal weight.).   Microbiology: No results found for this or any previous visit (from the past 720 hour(s)).  Medical History: Past Medical History  Diagnosis Date  . Atrial fibrillation ([place])   . Asthma   . Hypertension   . lt renal ca dx'd 03/2010    Medications:  Scheduled:  . cloNIDine  0.1 mg Oral Daily  . clopidogrel  75 mg Oral Daily  . furosemide  20 mg Oral Daily  . lisinopril  40 mg Oral Daily  . NIFEdipine  60 mg Oral Daily  . PHENObarbital  90 mg Oral Daily  . potassium chloride  20 mEq Oral Daily  . rosuvastatin  5 mg Oral Daily  . sodium chloride flush  3 mL Intravenous Q12H    Assessment: 82yo male presenting with AMS, on phenobarbital 90mg  daily pta for hx seizures.  CT head completed (see below), MRI pending.    No apparent seizures, but Pbarb level is mildly subtherapeutic- he took his last dose ~2200 last night.  Per d/w his wife, his Pbarb dose has been the same since the early 1990's.  I explained to wife that would give a little extra today and then plan to continue on 90mg  qHS for now.  He sees Dr. Delfina Redwood outpt and his phenobarbital is managed by him.  I can find no recent level in EPIC  In addition to above PMH, pt has had stroke x 2 ('81 & '92) and hemorrhagic stroke ('98, on Coumadin).  CT head (+)vasogenic edema, L-temporal  lobe mass vs infxn, unk age L-basal ganglia infarct + remote L-frontal infarct, cystic encephalomalacia, & B-subdural hygroma vs remote hematoma with mild mass effect.  Pt passed swallow evaluation    Goal of Therapy:  Phenobarbital level 15-40  Plan:  Phenobarbital 60mg  po x 1, then continue 90mg  po qHS F/U with Dr Lina Sar office on 3/12   Gracy Bruins, PharmD Clinical Pharmacist [facility]

## 2015-03-15 NOTE — ED Notes (Signed)
The pt is alert and opriented now  His bp has decrfeased
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
## 2015-03-15 NOTE — ED Notes (Signed)
PT back from pharmacy.

## 2015-03-15 NOTE — ED Notes (Signed)
The pt has no pain anywhere.  The pt knows his name   Not able to tell his birthday day of the week  Month or where he is he knows trump is the president moves al extremities

## 2015-03-15 NOTE — ED Notes (Signed)
Waiting on thed wife to arrive   Poor hygeine

## 2015-03-15 NOTE — ED Notes (Signed)
Heart healthy house tray ordered at 12:18p

## 2015-03-15 NOTE — ED Notes (Signed)
Attempted report 

## 2015-03-15 NOTE — ED Provider Notes (Signed)
CSN: [id number]     Arrival date & time 03/15/15  0409 History  By signing my name below, I, Arianna Nassar, attest that this documentation has been prepared under the direction and in the presence of Ezequiel Essex, MD. Electronically Signed: Julien Nordmann, ED Scribe. 03/15/2015. 4:36 AM.     Chief Complaint  Patient presents with  . ams     LEVEL 5 CAVEAT DUE TO ALTERED MENTAL STATUS  HPI HPI Comments: Lennyn Vasbinder is a 82 y.o. male who has hx of a-fib and HTN brought in by ambulance, who presents to the Emergency Department complaining of constant, gradual worsening altered mental status onset 2 days ago. EMS was called out for altered mental status for about two days. Pt's wife called EMS because she could not get him to respond. he has been lethargic, with no energy for the last two days. Pt has a small smell of urine and has not has been having any chest pain. Pt is currently on Plavix. Denies chest pain, abdominal pain and cough.   EMS vitals: 198/118, CBG 120, 95% RA.    6 AM. Patient's wife has arrived. She states he went to bed normal about 10:30 PM. He woke up around 1:30 AM (2:30 am with time change) making noises in his sleep and she had a hard time trying to wake him up. She then called EMS. He does have a history of seizures and is on phenobarbital. He is not anticoagulated.  Past Medical History  Diagnosis Date  . Atrial fibrillation ([place])   . Asthma   . Hypertension   . lt renal ca dx'd 03/2010   History reviewed. No pertinent past surgical history. No family history on file. Social History  Substance Use Topics  . Smoking status: Former Smoker    Types: Cigarettes    Quit date: 02/11/1983  . Smokeless tobacco: None  . Alcohol Use: No    Review of Systems  LEVEL 5 CAVEAT DUE TO ALTERED MENTAL STATUS  Allergies  Review of patient's allergies indicates no known allergies.  Home Medications   Prior to Admission medications   Medication Sig Start Date End  Date Taking? Authorizing Provider  cloNIDine (CATAPRES) 0.1 MG tablet Take 0.1 mg by mouth daily.     Historical Provider, MD  clopidogrel (PLAVIX) 75 MG tablet Take 75 mg by mouth daily.      Historical Provider, MD  furosemide (LASIX) 20 MG tablet Take 20 mg by mouth daily.      Historical Provider, MD  lisinopril (PRINIVIL,ZESTRIL) 40 MG tablet Take 40 mg by mouth daily.      Historical Provider, MD  NIFEdipine (PROCARDIA XL) 60 MG 24 hr tablet Take 60 mg by mouth daily.      Historical Provider, MD  PHENobarbital (LUMINAL) 30 MG tablet Take 90 mg by mouth daily.      Historical Provider, MD  potassium chloride (KLOR-CON) 20 MEQ packet Take 20 mEq by mouth daily.      Historical Provider, MD  rosuvastatin (CRESTOR) 5 MG tablet Take 5 mg by mouth daily.      Historical Provider, MD   BP 176/91 mmHg  Pulse 74  Temp(Src) 97.6 F (36.4 C) (Rectal)  Resp 25  SpO2 98% Physical Exam  Constitutional: He appears well-developed and well-nourished. No distress.  HENT:  Head: Normocephalic and atraumatic.  Mouth/Throat: Oropharynx is clear and moist. No oropharyngeal exudate.  Eyes: Conjunctivae and EOM are normal. Pupils are equal, round, and reactive  to light.  Neck: Normal range of motion. Neck supple.  No meningismus.  Cardiovascular: Normal rate, regular rhythm, normal heart sounds and intact distal pulses.   No murmur heard. Pulmonary/Chest: Effort normal and breath sounds normal. No respiratory distress.  Abdominal: Soft. There is no tenderness. There is no rebound and no guarding.  Musculoskeletal: Normal range of motion. He exhibits no edema or tenderness.  hidden penis, extremely large hernia of right hemi scrotum, +2 pitting edema to the knees, scrotum is soft, unable to reduce hernia, Ecchymosis to left chest wall    Neurological: He is alert. No cranial nerve deficit. He exhibits normal muscle tone. Coordination normal.  Moving all extremities equally, follows commands, alert x1   Skin: Skin is warm.  Psychiatric: He has a normal mood and affect. His behavior is normal.  Nursing note and vitals reviewed.   ED Course  Procedures  COORDINATION OF CARE: 4:16 AM Will order EKG and lab work.  Labs Review Labs Reviewed  COMPREHENSIVE METABOLIC PANEL - Abnormal; Notable for the following:    Glucose, Bld 120 (*)    Total Protein 6.4 (*)    ALT 15 (*)    All other components within normal limits  PHENOBARBITAL LEVEL - Abnormal; Notable for the following:    Phenobarbital 13.7 (*)    All other components within normal limits  CULTURE, BLOOD (ROUTINE X 2)  CULTURE, BLOOD (ROUTINE X 2)  CBC WITH DIFFERENTIAL/PLATELET  TROPONIN I  PROTIME-INR  AMMONIA  I-STAT CG4 LACTIC ACID, ED    Imaging Review Ct Abdomen Pelvis Wo Contrast  03/15/2015  CLINICAL DATA:  Abdominal pain, evaluate for inguinal hernia EXAM: CT ABDOMEN AND PELVIS WITHOUT CONTRAST TECHNIQUE: Multidetector CT imaging of the abdomen and pelvis was performed following the standard protocol without IV contrast. COMPARISON:  04/08/2014 FINDINGS: Lower chest and abdominal wall: Massive right inguinal hernia containing small and large bowel which is nonobstructed. Small fatty umbilical hernia. Mild bronchial wall thickening and scarring in the right lower lobe. Hepatobiliary: No focal liver abnormality. High-density material in the gallbladder with layering calculi. Front or nodular low-density along the anterior gallbladder wall is likely cholesterolosis or floating fat and has been seen since at least 2015 CT. Pancreas: Unremarkable. Spleen: Unremarkable. Adrenals/Urinary Tract: Negative adrenals. Stable appearance of left renal mass ablation site. Small hemorrhagic cyst in the lower pole right kidney, stable. Simple density exophytic left renal cyst is also stable. Unremarkable bladder. Reproductive:Mild prostate enlargement. Stomach/Bowel: No obstruction. No appendicitis. No inflammatory bowel wall thickening.  Extensive colonic diverticulosis. Vascular/Lymphatic: Diffuse atherosclerosis. No evidence of acute vascular disease. No mass or adenopathy. Peritoneal: No ascites or pneumoperitoneum. Musculoskeletal: No acute abnormalities. IMPRESSION: 1. No acute finding. 2. Massive right inguinal hernia containing nonobstructed bowel. 3. Cholelithiasis and other stable chronic findings are described above. Electronically Signed   By: Monte Fantasia M.D.   On: 03/15/2015 05:36   Ct Head Wo Contrast  03/15/2015  CLINICAL DATA:  Altered mental status with progressive lethargy. EXAM: CT HEAD WITHOUT CONTRAST TECHNIQUE: Contiguous axial images were obtained from the base of the skull through the vertex without intravenous contrast. COMPARISON:  None. FINDINGS: Skull and Sinuses:Remote biparietal craniotomy. Mucosal thickening focally and the upper left frontal sinus, without sinus expansion. Visualized orbits: Negative. Brain: The white matter of the left temporal lobe is low-density with sulcal effacement. Appearance suggests vasogenic edema. Appearing nodular thickening at the anterior pole of the left temporal lobe could be normal cortex or mass. There is contiguous low-density  in the left putamen and deep white matter tracts. 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 Well-defined, remote appearing low posterior left frontal cortical and subcortical infarct. No hydrocephalus or acute hemorrhage. There are CSF density subdural collections around the bilateral cerebral convexities at the vertex, measuring up to 13 mm in the right frontal region with mild cortical mass effect. These results were called by telephone at the time of interpretation on 03/15/2015 at 5:24 am to Dr. Ezequiel Essex , who verbally acknowledged these results. IMPRESSION: 1. Vasogenic type edema in the left temporal lobe primarily as with mass or  infection. Given location and history, suggest empiric treatment for herpes encephalitis during workup. Recommend MRI with contrast. 2. Age-indeterminate left basal ganglia infarct. 3. Remote left frontal infarct. Cystic encephalomalacia in the right temporal lobe. 4. Bilateral subdural hygroma or remote hematoma with mild mass effect in the right frontal region. Electronically Signed   By: Monte Fantasia M.D.   On: 03/15/2015 05:30   Dg Chest Portable 1 View  03/15/2015  CLINICAL DATA:  Altered mental status EXAM: PORTABLE CHEST 1 VIEW COMPARISON:  01/11/2010 FINDINGS: Borderline cardiomegaly is stable, accentuated by low volumes. Negative aortic and hilar contours. Right paratracheal fullness and convexity has a stable appearance since 2012 chest x-ray and thus is likely ectatic vessels. Chronic elevation the left diaphragm. There is no edema, consolidation, effusion, or pneumothorax. IMPRESSION: No acute finding.  Stable compared to 2012. Electronically Signed   By: Monte Fantasia M.D.   On: 03/15/2015 04:51   I have personally reviewed and evaluated these images and lab results as part of my medical decision-making.   EKG Interpretation   Date/Time:  Sunday March 15 2015 04:15:26 EDT Ventricular Rate:  83 PR Interval:  58 QRS Duration: 155 QT Interval:  441 QTC Calculation: 518 R Axis:   76 Text Interpretation:  Sinus rhythm Multiple premature complexes, vent &  supraven Short PR interval Right bundle branch block Right bundle branch  block Nonspecific ST abnormality Confirmed by [place] ([id number])  on 03/15/2015 4:37:57 AM      MDM   Final diagnoses:  None   patient from home with altered mental status. Code  STEMI activated by EMS. EKG does not show any ST elevation. Patient has no chest pain. Code STEMI canceled. Discussed with Dr. Ellyn Hack.   AMS workup begun.  Labs, CT, UA, CXR.  CT with abnormal L temporal lobe edema, chronic infarcts. Bilateral remote hygroma or  SDH.  Patient more lucid.  Denies complaints. Oriented x3.  At baseline per wife.   Case discussed with Dr. Leonel Ramsay. Patient's last seen normal was 10:30 PM. He has been altered since approximately 1 AM. He is now back to baseline. Dr. Leonel Ramsay agrees this is not a Stroke is likely his seizures. patient's mental status is clearing and he is now awake and alert and answering questions appropriately is back to baseline per his wife. CT scan results discussed with Dr. Leonel Ramsay. He does agree with MRI. Unlikely to be infectious but we'll cover with acyclovir pending MRI results. Phenobarbital level pending.  CT shows R inguinal hernia without obstruction or strangulation.  Normal appearance per wife, not painful.  Admission d/w Dr. Blaine Hamper. BP improved in the ED to 176/91. Patient back to baseline per wife.  CRITICAL CARE Performed by: Ezequiel Essex Total critical care time: 31 minutes Critical care time was exclusive  of separately billable procedures and treating other patients. Critical care was necessary to treat or prevent imminent or life-threatening deterioration. Critical care was time spent personally by me on the following activities: development of treatment plan with patient and/or surrogate as well as nursing, discussions with consultants, evaluation of patient's response to treatment, examination of patient, obtaining history from patient or surrogate, ordering and performing treatments and interventions, ordering and review of laboratory studies, ordering and review of radiographic studies, pulse oximetry and re-evaluation of patient's condition.   I personally performed the services described in this documentation, which was scribed in my presence. The recorded information has been reviewed and is accurate.   Ezequiel Essex, MD 03/15/15 [phone]

## 2015-03-15 NOTE — ED Notes (Signed)
The pt arrived by [facility] from home.  ams for 2-3 days wife reports that not responding tonicht

## 2015-03-15 NOTE — ED Notes (Signed)
Pt going to ct  

## 2015-03-15 NOTE — Progress Notes (Signed)
Pharmacy Antibiotic Note  Claron Cappell is a 82 y.o. male admitted on 03/15/2015 with AMS.  Head CT showed vasogenic type edema in the left temporal lobe primarily as with mass vs. Infection with radiologist recommending starting empiric treatment for herpes encephalitis. Pharmacy has been consulted for acyclovir dosing.  Of note, acyclovir 800 mg IV x1 in the ED on 3/12 at 0738.  Plan: -  acyclovir 700 (10 mg/kg/dose) based on IBW (71 kg) d/t BMI >30     Temp (24hrs), Avg:98.1 F (36.7 C), Min:97.6 F (36.4 C), Max:98.6 F (37 C)   Recent Labs Lab 03/15/15 0426 03/15/15 0434 03/15/15 0923 03/15/15 0936  WBC  --  5.9 6.2  --   CREATININE  --  0.92 0.82  --   LATICACIDVEN 0.90  --   --  0.72    CrCl cannot be calculated (Unknown ideal weight.).    No Known Allergies  Antimicrobials this admission: 3/12 acyclovir Rx>>  Levels/dose changes this admission: n/a  Microbiology results: 3/12 bcx x2:    Thank you for allowing pharmacy to be a part of this patient's care.  Lynelle Doctor 03/15/2015 3:43 PM

## 2015-03-16 ENCOUNTER — Other Ambulatory Visit (HOSPITAL_COMMUNITY): Payer: BC Managed Care – PPO

## 2015-03-16 LAB — BASIC METABOLIC PANEL
ANION GAP: 8 (ref 5–15)
BUN: 15 mg/dL (ref 6–20)
CHLORIDE: 105 mmol/L (ref 101–111)
CO2: 29 mmol/L (ref 22–32)
Calcium: 8.9 mg/dL (ref 8.9–10.3)
Creatinine, Ser: 0.99 mg/dL (ref 0.61–1.24)
GFR calc non Af Amer: >60 mL/min (ref >60)
Glucose, Bld: 124 mg/dL — ABNORMAL HIGH (ref 65–99)
POTASSIUM: 4 mmol/L (ref 3.5–5.1)
SODIUM: 142 mmol/L (ref 135–145)

## 2015-03-16 LAB — CBC
HEMATOCRIT: 41.3 % (ref 39.0–52.0)
Hemoglobin: 13.3 g/dL (ref 13.0–17.0)
MCH: 29.6 pg (ref 26.0–34.0)
MCHC: 32.2 g/dL (ref 30.0–36.0)
MCV: 92 fL (ref 78.0–100.0)
Platelets: 188 10*3/uL (ref 150–400)
RBC: 4.49 MIL/uL (ref 4.22–5.81)
RDW: 14.5 % (ref 11.5–15.5)
WBC: 6.8 10*3/uL (ref 4.0–10.5)

## 2015-03-16 LAB — RPR: RPR Ser Ql: NONREACTIVE

## 2015-03-16 LAB — HIV ANTIBODY (ROUTINE TESTING W REFLEX): HIV Screen 4th Generation wRfx: NONREACTIVE

## 2015-03-16 NOTE — Plan of Care (Signed)
Problem: Skin Integrity: Goal: Risk for impaired skin integrity will decrease Outcome: Progressing Patient has sat on side of bed per himself this shift for about ten minutes and then requested to get into the chair.  Patient needed minimal assistance from RN when transferring from bed to chair.  Skin care provided as needed throughout this shift.

## 2015-03-16 NOTE — Consult Note (Signed)
Reason for Consult: Meningioma   Referring Physician: [name] is an 82 y.o. male.  HPI: 82 year old male with transient confusion and admitted to the hospital for workup. CT scan demonstrates left temporal lobe abnormality. Follow-up MRI scan demonstrates left sphenoid wing meningioma with some surrounding edema. Currently no complaints of headache. No symptoms of radial neuropathy. No current issues with speech or confusion.  Past Medical History  Diagnosis Date  . Atrial fibrillation ([place])   . Asthma   . Hypertension   . lt renal ca dx'd 03/2010  . Seizures ([place])   . Stroke [facility])     History reviewed. No pertinent past surgical history.  No family history on file.  Social History:  reports that he quit smoking about 32 years ago. His smoking use included Cigarettes. He does not have any smokeless tobacco history on file. He reports that he does not drink alcohol or use illicit drugs.  Allergies: No Known Allergies  Medications: I have reviewed the patient's current medications.  Results for orders placed or performed during the hospital encounter of 03/15/15 (from the past 48 hour(s))  I-Stat CG4 Lactic Acid, ED     Status: None   Collection Time: 03/15/15  4:26 AM  Result Value Ref Range   Lactic Acid, Venous 0.90 0.5 - 2.0 mmol/L  CBC with Differential/Platelet     Status: None   Collection Time: 03/15/15  4:34 AM  Result Value Ref Range   WBC 5.9 4.0 - 10.5 K/uL   RBC 4.56 4.22 - 5.81 MIL/uL   Hemoglobin 13.7 13.0 - 17.0 g/dL   HCT 41.9 39.0 - 52.0 %   MCV 91.9 78.0 - 100.0 fL   MCH 30.0 26.0 - 34.0 pg   MCHC 32.7 30.0 - 36.0 g/dL   RDW 14.4 11.5 - 15.5 %   Platelets 166 150 - 400 K/uL   Neutrophils Relative % 50 %   Neutro Abs 3.0 1.7 - 7.7 K/uL   Lymphocytes Relative 33 %   Lymphs Abs 2.0 0.7 - 4.0 K/uL   Monocytes Relative 10 %   Monocytes Absolute 0.6 0.1 - 1.0 K/uL   Eosinophils Relative 6 %   Eosinophils Absolute 0.3 0.0 - 0.7 K/uL    Basophils Relative 1 %   Basophils Absolute 0.0 0.0 - 0.1 K/uL  Comprehensive metabolic panel     Status: Abnormal   Collection Time: 03/15/15  4:34 AM  Result Value Ref Range   Sodium 143 135 - 145 mmol/L   Potassium 4.5 3.5 - 5.1 mmol/L   Chloride 102 101 - 111 mmol/L   CO2 28 22 - 32 mmol/L   Glucose, Bld 120 (H) 65 - 99 mg/dL   BUN 16 6 - 20 mg/dL   Creatinine, Ser 0.92 0.61 - 1.24 mg/dL   Calcium 9.0 8.9 - 10.3 mg/dL   Total Protein 6.4 (L) 6.5 - 8.1 g/dL   Albumin 3.9 3.5 - 5.0 g/dL   AST 22 15 - 41 U/L   ALT 15 (L) 17 - 63 U/L   Alkaline Phosphatase 80 38 - 126 U/L   Total Bilirubin 0.4 0.3 - 1.2 mg/dL   GFR calc non Af Amer >60 >60 mL/min   GFR calc Af Amer >60 >60 mL/min    Comment: (NOTE) The eGFR has been calculated using the CKD EPI equation. This calculation has not been validated in all clinical situations. eGFR's persistently <60 mL/min signify possible Chronic Kidney Disease.    Anion  gap 13 5 - 15  Troponin I     Status: None   Collection Time: 03/15/15  4:34 AM  Result Value Ref Range   Troponin I <0.03 <0.031 ng/mL    Comment:        NO INDICATION OF MYOCARDIAL INJURY.   Protime-INR     Status: None   Collection Time: 03/15/15  4:34 AM  Result Value Ref Range   Prothrombin Time 14.1 11.6 - 15.2 seconds   INR 1.07 0.00 - 1.49  Phenobarbital level     Status: Abnormal   Collection Time: 03/15/15  4:34 AM  Result Value Ref Range   Phenobarbital 13.7 (L) 15.0 - 40.0 ug/mL  Blood culture (routine x 2)     Status: None (Preliminary result)   Collection Time: 03/15/15  4:36 AM  Result Value Ref Range   Specimen Description BLOOD RIGHT ARM    Special Requests BOTTLES DRAWN AEROBIC AND ANAEROBIC 5CC    Culture NO GROWTH 1 DAY    Report Status PENDING   Blood culture (routine x 2)     Status: None (Preliminary result)   Collection Time: 03/15/15  4:45 AM  Result Value Ref Range   Specimen Description BLOOD RIGHT HAND    Special Requests IN PEDIATRIC  BOTTLE 4CC    Culture NO GROWTH 1 DAY    Report Status PENDING   Ammonia     Status: None   Collection Time: 03/15/15  4:51 AM  Result Value Ref Range   Ammonia 26 9 - 35 umol/L  Comprehensive metabolic panel     Status: Abnormal   Collection Time: 03/15/15  9:23 AM  Result Value Ref Range   Sodium 143 135 - 145 mmol/L   Potassium 4.0 3.5 - 5.1 mmol/L   Chloride 105 101 - 111 mmol/L   CO2 27 22 - 32 mmol/L   Glucose, Bld 119 (H) 65 - 99 mg/dL   BUN 13 6 - 20 mg/dL   Creatinine, Ser 0.82 0.61 - 1.24 mg/dL   Calcium 8.9 8.9 - 10.3 mg/dL   Total Protein 6.2 (L) 6.5 - 8.1 g/dL   Albumin 3.7 3.5 - 5.0 g/dL   AST 19 15 - 41 U/L   ALT 15 (L) 17 - 63 U/L   Alkaline Phosphatase 81 38 - 126 U/L   Total Bilirubin 0.7 0.3 - 1.2 mg/dL   GFR calc non Af Amer >60 >60 mL/min   GFR calc Af Amer >60 >60 mL/min    Comment: (NOTE) The eGFR has been calculated using the CKD EPI equation. This calculation has not been validated in all clinical situations. eGFR's persistently <60 mL/min signify possible Chronic Kidney Disease.    Anion gap 11 5 - 15  CBC     Status: None   Collection Time: 03/15/15  9:23 AM  Result Value Ref Range   WBC 6.2 4.0 - 10.5 K/uL   RBC 4.61 4.22 - 5.81 MIL/uL   Hemoglobin 14.1 13.0 - 17.0 g/dL   HCT 42.0 39.0 - 52.0 %   MCV 91.1 78.0 - 100.0 fL   MCH 30.6 26.0 - 34.0 pg   MCHC 33.6 30.0 - 36.0 g/dL   RDW 14.3 11.5 - 15.5 %   Platelets 162 150 - 400 K/uL  TSH     Status: None   Collection Time: 03/15/15  9:23 AM  Result Value Ref Range   TSH 2.490 0.350 - 4.500 uIU/mL  Vitamin B12  Status: None   Collection Time: 03/15/15  9:23 AM  Result Value Ref Range   Vitamin B-12 474 180 - 914 pg/mL    Comment: (NOTE) This assay is not validated for testing neonatal or myeloproliferative syndrome specimens for Vitamin B12 levels.   RPR     Status: None   Collection Time: 03/15/15  9:23 AM  Result Value Ref Range   RPR Ser Ql Non Reactive Non Reactive     Comment: (NOTE) Performed At: [facility] [address] [place], [place] [id number] Lindon Romp MD QD:[id number]   I-Stat CG4 Lactic Acid, ED     Status: None   Collection Time: 03/15/15  9:36 AM  Result Value Ref Range   Lactic Acid, Venous 0.72 0.5 - 2.0 mmol/L  Urinalysis, Routine w reflex microscopic (not at [facility])     Status: None   Collection Time: 03/15/15 10:35 AM  Result Value Ref Range   Color, Urine YELLOW YELLOW   APPearance CLEAR CLEAR   Specific Gravity, Urine 1.009 1.005 - 1.030   pH 7.0 5.0 - 8.0   Glucose, UA NEGATIVE NEGATIVE mg/dL   Hgb urine dipstick NEGATIVE NEGATIVE   Bilirubin Urine NEGATIVE NEGATIVE   Ketones, ur NEGATIVE NEGATIVE mg/dL   Protein, ur NEGATIVE NEGATIVE mg/dL   Nitrite NEGATIVE NEGATIVE   Leukocytes, UA NEGATIVE NEGATIVE    Comment: MICROSCOPIC NOT DONE ON URINES WITH NEGATIVE PROTEIN, BLOOD, LEUKOCYTES, NITRITE, OR GLUCOSE <1000 mg/dL.  Urine rapid drug screen (hosp performed)     Status: Abnormal   Collection Time: 03/15/15 10:35 AM  Result Value Ref Range   Opiates NONE DETECTED NONE DETECTED   Cocaine NONE DETECTED NONE DETECTED   Benzodiazepines NONE DETECTED NONE DETECTED   Amphetamines NONE DETECTED NONE DETECTED   Tetrahydrocannabinol NONE DETECTED NONE DETECTED   Barbiturates POSITIVE (A) NONE DETECTED    Comment:        DRUG SCREEN FOR MEDICAL PURPOSES ONLY.  IF CONFIRMATION IS NEEDED FOR ANY PURPOSE, NOTIFY LAB WITHIN 5 DAYS.        LOWEST DETECTABLE LIMITS FOR URINE DRUG SCREEN Drug Class       Cutoff (ng/mL) Amphetamine      1000 Barbiturate      200 Benzodiazepine   407 Tricyclics       680 Opiates          300 Cocaine          300 THC              50   Basic metabolic panel     Status: Abnormal   Collection Time: 03/16/15  4:06 AM  Result Value Ref Range   Sodium 142 135 - 145 mmol/L   Potassium 4.0 3.5 - 5.1 mmol/L   Chloride 105 101 - 111 mmol/L   CO2 29 22 - 32 mmol/L   Glucose, Bld 124 (H)  65 - 99 mg/dL   BUN 15 6 - 20 mg/dL   Creatinine, Ser 0.99 0.61 - 1.24 mg/dL   Calcium 8.9 8.9 - 10.3 mg/dL   GFR calc non Af Amer >60 >60 mL/min   GFR calc Af Amer >60 >60 mL/min    Comment: (NOTE) The eGFR has been calculated using the CKD EPI equation. This calculation has not been validated in all clinical situations. eGFR's persistently <60 mL/min signify possible Chronic Kidney Disease.    Anion gap 8 5 - 15  CBC     Status: None  Collection Time: 03/16/15  4:06 AM  Result Value Ref Range   WBC 6.8 4.0 - 10.5 K/uL   RBC 4.49 4.22 - 5.81 MIL/uL   Hemoglobin 13.3 13.0 - 17.0 g/dL   HCT 41.3 39.0 - 52.0 %   MCV 92.0 78.0 - 100.0 fL   MCH 29.6 26.0 - 34.0 pg   MCHC 32.2 30.0 - 36.0 g/dL   RDW 14.5 11.5 - 15.5 %   Platelets 188 150 - 400 K/uL    Ct Abdomen Pelvis Wo Contrast  03/15/2015  CLINICAL DATA:  Abdominal pain, evaluate for inguinal hernia EXAM: CT ABDOMEN AND PELVIS WITHOUT CONTRAST TECHNIQUE: Multidetector CT imaging of the abdomen and pelvis was performed following the standard protocol without IV contrast. COMPARISON:  04/08/2014 FINDINGS: Lower chest and abdominal wall: Massive right inguinal hernia containing small and large bowel which is nonobstructed. Small fatty umbilical hernia. Mild bronchial wall thickening and scarring in the right lower lobe. Hepatobiliary: No focal liver abnormality. High-density material in the gallbladder with layering calculi. Front or nodular low-density along the anterior gallbladder wall is likely cholesterolosis or floating fat and has been seen since at least 2015 CT. Pancreas: Unremarkable. Spleen: Unremarkable. Adrenals/Urinary Tract: Negative adrenals. Stable appearance of left renal mass ablation site. Small hemorrhagic cyst in the lower pole right kidney, stable. Simple density exophytic left renal cyst is also stable. Unremarkable bladder. Reproductive:Mild prostate enlargement. Stomach/Bowel: No obstruction. No appendicitis. No  inflammatory bowel wall thickening. Extensive colonic diverticulosis. Vascular/Lymphatic: Diffuse atherosclerosis. No evidence of acute vascular disease. No mass or adenopathy. Peritoneal: No ascites or pneumoperitoneum. Musculoskeletal: No acute abnormalities. IMPRESSION: 1. No acute finding. 2. Massive right inguinal hernia containing nonobstructed bowel. 3. Cholelithiasis and other stable chronic findings are described above. Electronically Signed   By: Monte Fantasia M.D.   On: 03/15/2015 05:36   Ct Head Wo Contrast  03/15/2015  CLINICAL DATA:  Altered mental status with progressive lethargy. EXAM: CT HEAD WITHOUT CONTRAST TECHNIQUE: Contiguous axial images were obtained from the base of the skull through the vertex without intravenous contrast. COMPARISON:  None. FINDINGS: Skull and Sinuses:Remote biparietal craniotomy. Mucosal thickening focally and the upper left frontal sinus, without sinus expansion. Visualized orbits: Negative. Brain: The white matter of the left temporal lobe is low-density with sulcal effacement. Appearance suggests vasogenic edema. Appearing nodular thickening at the anterior pole of the left temporal lobe could be normal cortex or mass. There is contiguous low-density in the left putamen and deep white matter tracts. 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 Well-defined, remote appearing low posterior left frontal cortical and subcortical infarct. No hydrocephalus or acute hemorrhage. There are CSF density subdural collections around the bilateral cerebral convexities at the vertex, measuring up to 13 mm in the right frontal region with mild cortical mass effect. These results were called by telephone at the time of interpretation on 03/15/2015 at 5:24 am to Dr. Ezequiel Essex , who verbally acknowledged these results. IMPRESSION: 1. Vasogenic type edema in the left  temporal lobe primarily as with mass or infection. Given location and history, suggest empiric treatment for herpes encephalitis during workup. Recommend MRI with contrast. 2. Age-indeterminate left basal ganglia infarct. 3. Remote left frontal infarct. Cystic encephalomalacia in the right temporal lobe. 4. Bilateral subdural hygroma or remote hematoma with mild mass effect in the right frontal region. Electronically Signed   By: Monte Fantasia M.D.   On:  03/15/2015 05:30   Mr Jeri Cos AO Contrast  03/15/2015  CLINICAL DATA:  Altered mental status. Seizure. Left temporal lobe edema on CT. History of left renal cell cancer status post ablation. EXAM: MRI HEAD WITHOUT AND WITH CONTRAST TECHNIQUE: Multiplanar, multiecho pulse sequences of the brain and surrounding structures were obtained without and with intravenous contrast. CONTRAST:  28m MULTIHANCE GADOBENATE DIMEGLUMINE 529 MG/ML IV SOLN COMPARISON:  Head CT 03/15/2015 FINDINGS: No definite acute infarct is identified. There is a small focus of cortical/ subcortical trace diffusion signal abnormality in the high posterior left frontal lobe without evidence of significantly reduced ADC, likely a small chronic infarct. There is encephalomalacia in the medial left parietal lobe with prominent chronic hemosiderin deposition and ex vacuo dilatation of the left lateral ventricle. The there is also hemosiderin staining involving several right parietal sulci compatible with remote subarachnoid hemorrhage. Numerous chronic parenchymal microhemorrhages are present in the cerebellum, brainstem, thalami, and scattered throughout both cerebral hemispheres. Chronic blood products are also noted associated with a chronic left frontal opercular infarct as well as with a large area of cystic encephalomalacia involving the right temporal lobe. Asymmetric CSF is noted over both frontal convexities and along the left interhemispheric fissure, measuring up to 15 mm in thickness  anterior to the right frontal lobe with mild cortical mass effect and overall suggestive of subdural hygromas. There is a homogeneously enhancing extra-axial mass with broad dural base arising from the region of the left anterior clinoid process and sphenoid wing. The mass measures approximately 4.2 x 2.3 x 3.1 cm (oblique AP by transverse by craniocaudal). The mass extends anterolaterally along the sphenoid wing in the middle cranial fossa. There is involvement of the left cavernous sinus. The mass contacts and slightly displaces the pre chiasmatic left optic nerve without frank compression or encasement. There is mild-to-moderate vasogenic edema in the left temporal and inferior left frontal lobe with involvement of the adjacent deep white matter tracts. There is a round 11 mm homogeneously enhancing extra-axial mass associated with the dura just below the torcula without associated cerebellar edema or mass effect. No evidence of sinus invasion. Prominent dilated perivascular spaces are present throughout the basal ganglia bilaterally. Chronic thalamic lacunar infarcts are noted. Sequelae of prior bilateral parietal craniotomies are identified. There is a mildly to moderately expanded, partially empty sella. Prior bilateral cataract extraction is noted. There is a small right maxillary sinus mucous retention cyst. The mastoid air cells are clear. Major intracranial vascular flow voids are preserved. IMPRESSION: 1. 4.2 cm left sphenoid wing meningioma with cavernous sinus invasion, mild left optic nerve mass effect, and mild-to-moderate brain edema. 2. 11 mm posterior fossa meningioma. 3. Areas of chronic encephalomalacia, chronic hemorrhages, and subdural hygromas as above. Electronically Signed   By: ALogan BoresM.D.   On: 03/15/2015 09:54   Dg Chest Portable 1 View  03/15/2015  CLINICAL DATA:  Altered mental status EXAM: PORTABLE CHEST 1 VIEW COMPARISON:  01/11/2010 FINDINGS: Borderline cardiomegaly is  stable, accentuated by low volumes. Negative aortic and hilar contours. Right paratracheal fullness and convexity has a stable appearance since 2012 chest x-ray and thus is likely ectatic vessels. Chronic elevation the left diaphragm. There is no edema, consolidation, effusion, or pneumothorax. IMPRESSION: No acute finding.  Stable compared to 2012. Electronically Signed   By: JMonte FantasiaM.D.   On: 03/15/2015 04:51     Blood pressure 115/69, pulse 87, temperature 98.5 F (36.9 C), temperature source Oral, resp. rate 21, height 5'  9" (1.753 m), weight 100.699 kg (222 lb), SpO2 95 %. The patient is awake and alert. He is currently oriented and fluent. His judgment and insight seem reasonable. Examination head ears eyes and throat is unremarkable. Chest and abdomen are benign. Extremities are free from injury deformity. Neurologically cranial nerve function grossly intact. Motor 5/5 bilaterally with no pronator drift. Patient with some mild tremulousness on the right upper extremity. Reflexes normal.  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 At this point certainly there are no thoughts of doing surgery on this gentleman as the sphenoid wing meningioma resection would be quite involved with very high morbidity given his tenuous medical state. At this point I'm not sure what to attribute his transient confusion upon. He may very likely had some element of a TIA versus some subclinical seizure. I would recommend continued observation.  Arthur Wood A 03/16/2015, 1:16 PM

## 2015-03-16 NOTE — Progress Notes (Signed)
PROGRESS NOTE  Arthur Wood [id number] DOB: [birth year] DOA: 03/15/2015 PCP: Kandice Hams, MD Outpatient Specialists:    LOS: 1 day   Brief Narrative: Arthur Wood is a 82 y.o. 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 Around 10 pm, accompanied by progressive lethargy. Per wife report, last seen interactive around 1 am.No nausea or vomiting was noted. No dizziness. No vision changes. No seizures. No apparent cardiac, respiratory complaints or diaphoresis. No sick contacts. No recent travels. Had urine incontinence. Symptoms are beginning to clear, returning to his baseline.  Assessment & Plan: Active Problems:   Altered mental status   Atrial fibrillation (HCC)   Hypertension   Asthma   Acute encephalopathy   Acute encephalopathy  - Etiology unclear, CT scan with showed vasogenic type edema in the left temporal lobe primarily as with mass vs infection such as herpes encephalitis. He was started on antivirals. Patient was without fevers, headaches, no neck pain, meningismus. No photophobia, no nausea. MRI showed 4.2 cm left sphenoid wing meningioma with cavernous sinus invasion, mild left optic nerve mass effect, and mild-to-moderate brain edema. Less like infection given MRI findings. Consulted neurosurgery, appreciate input, Dr. Annette Stable to see. - EEG - TIA workup with 2D echo, carotids - TSH normal, B12 normal, Ammonia normal - back to baseline this morning  Atrial Fibrillation not on anticoagulants due to history of brain hemorrhage while on Coumadin in 1998 -2D Echo  -Continue home meds -  Hypertension - Blood pressure quite elevated on admission now better controlled  Asthma, likely COPD with regular episodes of bronchitis - This is stable, Osats normal on RA, no acute changes. CXR  NAD  History of L RCC, not on treatment - CT abdomen without new suspicious masses  DVT prophylaxis: SCD Code Status: Full Family Communication: d/w wife over the phone Disposition Plan: PT to evaluate Barriers for discharge: TIA workup  Consultants:   Neurosurgery   Procedures:   2D echo: pending  Carotids: pending  Antimicrobials:  None    Subjective: No complaints this morning, no HA, no nausea, denies photophobia  Objective: Filed Vitals:   03/15/15 1616 03/15/15 1900 03/15/15 2000 03/16/15 0500  BP: 170/80  145/73 115/69  Pulse: 79  76 87  Temp: 98.7 F (37.1 C)  98.7 F (37.1 C) 98.5 F (36.9 C)  TempSrc: Oral  Oral Oral  Resp: 20  21   Height:  5\' 9"  (1.753 m)    Weight: 101.515 kg (223 lb 12.8 oz)   100.699 kg (222 lb)  SpO2: 96%  98% 95%    Intake/Output Summary (Last 24 hours) at 03/16/15 1231 Last data filed at 03/16/15 0843  Gross per 24 hour  Intake   1184 ml  Output      0 ml  Net   1184 ml   Filed Weights   03/15/15 1616 03/16/15 0500  Weight: 101.515 kg (223 lb 12.8 oz) 100.699 kg (222 lb)    Examination: BP 115/69 mmHg  Pulse 87  Temp(Src) 98.5 F (36.9 C) (Oral)  Resp 21  Ht 5\' 9"  (1.753 m)  Wt 100.699 kg (222 lb)  BMI 32.77 kg/m2  SpO2 95%  GENERAL: NAD  HEENT: head NCAT, no scleral icterus.   NECK: Supple. No carotid bruits. No lymphadenopathy or thyromegaly.  LUNGS: Clear to auscultation. No wheezing or crackles  HEART: Regular rate and rhythm without murmur. 2+ pulses, no JVD, 2+ pitting edema  ABDOMEN: Soft, nontender, and nondistended. Positive bowel sounds.   EXTREMITIES: Without any cyanosis or clubbing  NEUROLOGIC: non focal    Data Reviewed: I have personally reviewed following labs and imaging studies  CBC:  Recent Labs Lab 03/15/15 0434 03/15/15 0923 03/16/15 0406  WBC 5.9 6.2 6.8  NEUTROABS 3.0  --   --   HGB 13.7 14.1 13.3  HCT 41.9 42.0 41.3  MCV 91.9 91.1 92.0  PLT 166 162 0000000    Basic Metabolic Panel:  Recent Labs Lab 03/15/15 0434 03/15/15 0923 03/16/15 0406  NA 143 143 142  K 4.5 4.0 4.0  CL 102 105 105  CO2 28 27 29   GLUCOSE 120* 119* 124*  BUN 16 13 15   CREATININE 0.92 0.82 0.99  CALCIUM 9.0 8.9 8.9   GFR: Estimated Creatinine Clearance: 68.5 mL/min (by C-G formula based on Cr of 0.99). Liver Function Tests:  Recent Labs Lab 03/15/15 0434 03/15/15 0923  AST 22 19  ALT 15* 15*  ALKPHOS 80 81  BILITOT 0.4 0.7  PROT 6.4* 6.2*  ALBUMIN 3.9 3.7   No results for input(s): LIPASE, AMYLASE in the last 168 hours.  Recent Labs Lab 03/15/15 0451  AMMONIA 26   Coagulation Profile:  Recent Labs Lab 03/15/15 0434  INR 1.07   Cardiac Enzymes:  Recent Labs Lab 03/15/15 0434  TROPONINI <0.03   BNP (last 3 results) No results for input(s): PROBNP in the last 8760 hours. HbA1C: No results for input(s): HGBA1C in the last 72 hours. CBG: No results for input(s): GLUCAP in the last 168 hours. Lipid Profile: No results for input(s): CHOL, HDL, LDLCALC, TRIG, CHOLHDL, LDLDIRECT in the last 72 hours. Thyroid Function Tests:  Recent Labs  03/15/15 0923  TSH 2.490   Anemia Panel:  Recent Labs  03/15/15 0923  VITAMINB12 474   Urine analysis:    Component Value Date/Time   COLORURINE YELLOW 03/15/2015 [place] 03/15/2015 1035   LABSPEC 1.009 03/15/2015 1035   PHURINE 7.0 03/15/2015 1035   GLUCOSEU NEGATIVE 03/15/2015 1035   HGBUR NEGATIVE 03/15/2015 1035   BILIRUBINUR NEGATIVE 03/15/2015 1035   KETONESUR NEGATIVE 03/15/2015 1035   PROTEINUR NEGATIVE 03/15/2015 1035   UROBILINOGEN 1.0 01/11/2010 0818   NITRITE NEGATIVE 03/15/2015 1035   LEUKOCYTESUR NEGATIVE 03/15/2015 1035   Sepsis Labs: Invalid input(s): PROCALCITONIN, LACTICIDVEN  Recent Results (from the past 240 hour(s))  Blood culture (routine x 2)     Status: None (Preliminary result)   Collection Time: 03/15/15  4:36 AM  Result Value Ref Range  Status   Specimen Description BLOOD RIGHT ARM  Final   Special Requests BOTTLES DRAWN AEROBIC AND ANAEROBIC 5CC  Final   Culture NO GROWTH 1 DAY  Final   Report Status PENDING  Incomplete  Blood culture (routine x 2)     Status: None (Preliminary result)   Collection Time: 03/15/15  4:45 AM  Result Value Ref Range Status   Specimen Description BLOOD RIGHT HAND  Final   Special Requests IN PEDIATRIC BOTTLE 4CC  Final   Culture NO GROWTH 1 DAY  Final   Report Status PENDING  Incomplete      Radiology Studies: Ct Abdomen Pelvis Wo Contrast  03/15/2015  CLINICAL DATA:  Abdominal pain, evaluate for inguinal hernia EXAM: CT ABDOMEN AND PELVIS WITHOUT CONTRAST TECHNIQUE: Multidetector CT imaging of the abdomen and pelvis was performed following  the standard protocol without IV contrast. COMPARISON:  04/08/2014 FINDINGS: Lower chest and abdominal wall: Massive right inguinal hernia containing small and large bowel which is nonobstructed. Small fatty umbilical hernia. Mild bronchial wall thickening and scarring in the right lower lobe. Hepatobiliary: No focal liver abnormality. High-density material in the gallbladder with layering calculi. Front or nodular low-density along the anterior gallbladder wall is likely cholesterolosis or floating fat and has been seen since at least 2015 CT. Pancreas: Unremarkable. Spleen: Unremarkable. Adrenals/Urinary Tract: Negative adrenals. Stable appearance of left renal mass ablation site. Small hemorrhagic cyst in the lower pole right kidney, stable. Simple density exophytic left renal cyst is also stable. Unremarkable bladder. Reproductive:Mild prostate enlargement. Stomach/Bowel: No obstruction. No appendicitis. No inflammatory bowel wall thickening. Extensive colonic diverticulosis. Vascular/Lymphatic: Diffuse atherosclerosis. No evidence of acute vascular disease. No mass or adenopathy. Peritoneal: No ascites or pneumoperitoneum. Musculoskeletal: No acute  abnormalities. IMPRESSION: 1. No acute finding. 2. Massive right inguinal hernia containing nonobstructed bowel. 3. Cholelithiasis and other stable chronic findings are described above. Electronically Signed   By: Monte Fantasia M.D.   On: 03/15/2015 05:36   Ct Head Wo Contrast  03/15/2015  CLINICAL DATA:  Altered mental status with progressive lethargy. EXAM: CT HEAD WITHOUT CONTRAST TECHNIQUE: Contiguous axial images were obtained from the base of the skull through the vertex without intravenous contrast. COMPARISON:  None. FINDINGS: Skull and Sinuses:Remote biparietal craniotomy. Mucosal thickening focally and the upper left frontal sinus, without sinus expansion. Visualized orbits: Negative. Brain: The white matter of the left temporal lobe is low-density with sulcal effacement. Appearance suggests vasogenic edema. Appearing nodular thickening at the anterior pole of the left temporal lobe could be normal cortex or mass. There is contiguous low-density in the left putamen and deep white matter tracts. 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 Well-defined, remote appearing low posterior left frontal cortical and subcortical infarct. No hydrocephalus or acute hemorrhage. There are CSF density subdural collections around the bilateral cerebral convexities at the vertex, measuring up to 13 mm in the right frontal region with mild cortical mass effect. These results were called by telephone at the time of interpretation on 03/15/2015 at 5:24 am to Dr. Ezequiel Essex , who verbally acknowledged these results. IMPRESSION: 1. Vasogenic type edema in the left temporal lobe primarily as with mass or infection. Given location and history, suggest empiric treatment for herpes encephalitis during workup. Recommend MRI with contrast. 2. Age-indeterminate left basal ganglia infarct. 3. Remote left frontal  infarct. Cystic encephalomalacia in the right temporal lobe. 4. Bilateral subdural hygroma or remote hematoma with mild mass effect in the right frontal region. Electronically Signed   By: Monte Fantasia M.D.   On: 03/15/2015 05:30   Mr Jeri Cos [id number] Contrast  03/15/2015  CLINICAL DATA:  Altered mental status. Seizure. Left temporal lobe edema on CT. History of left renal cell cancer status post ablation. EXAM: MRI HEAD WITHOUT AND WITH CONTRAST TECHNIQUE: Multiplanar, multiecho pulse sequences of the brain and surrounding structures were obtained without and with intravenous contrast. CONTRAST:  72mL MULTIHANCE GADOBENATE DIMEGLUMINE 529 MG/ML IV SOLN COMPARISON:  Head CT 03/15/2015 FINDINGS: No definite acute infarct is identified. There is a small focus of cortical/ subcortical trace diffusion signal abnormality in the high posterior left frontal lobe without evidence of significantly reduced ADC, likely a small chronic infarct. There is encephalomalacia in the medial left parietal lobe with prominent  chronic hemosiderin deposition and ex vacuo dilatation of the left lateral ventricle. The there is also hemosiderin staining involving several right parietal sulci compatible with remote subarachnoid hemorrhage. Numerous chronic parenchymal microhemorrhages are present in the cerebellum, brainstem, thalami, and scattered throughout both cerebral hemispheres. Chronic blood products are also noted associated with a chronic left frontal opercular infarct as well as with a large area of cystic encephalomalacia involving the right temporal lobe. Asymmetric CSF is noted over both frontal convexities and along the left interhemispheric fissure, measuring up to 15 mm in thickness anterior to the right frontal lobe with mild cortical mass effect and overall suggestive of subdural hygromas. There is a homogeneously enhancing extra-axial mass with broad dural base arising from the region of the left anterior clinoid process  and sphenoid wing. The mass measures approximately 4.2 x 2.3 x 3.1 cm (oblique AP by transverse by craniocaudal). The mass extends anterolaterally along the sphenoid wing in the middle cranial fossa. There is involvement of the left cavernous sinus. The mass contacts and slightly displaces the pre chiasmatic left optic nerve without frank compression or encasement. There is mild-to-moderate vasogenic edema in the left temporal and inferior left frontal lobe with involvement of the adjacent deep white matter tracts. There is a round 11 mm homogeneously enhancing extra-axial mass associated with the dura just below the torcula without associated cerebellar edema or mass effect. No evidence of sinus invasion. Prominent dilated perivascular spaces are present throughout the basal ganglia bilaterally. Chronic thalamic lacunar infarcts are noted. Sequelae of prior bilateral parietal craniotomies are identified. There is a mildly to moderately expanded, partially empty sella. Prior bilateral cataract extraction is noted. There is a small right maxillary sinus mucous retention cyst. The mastoid air cells are clear. Major intracranial vascular flow voids are preserved. IMPRESSION: 1. 4.2 cm left sphenoid wing meningioma with cavernous sinus invasion, mild left optic nerve mass effect, and mild-to-moderate brain edema. 2. 11 mm posterior fossa meningioma. 3. Areas of chronic encephalomalacia, chronic hemorrhages, and subdural hygromas as above. Electronically Signed   By: Logan Bores M.D.   On: 03/15/2015 09:54   Dg Chest Portable 1 View  03/15/2015  CLINICAL DATA:  Altered mental status EXAM: PORTABLE CHEST 1 VIEW COMPARISON:  01/11/2010 FINDINGS: Borderline cardiomegaly is stable, accentuated by low volumes. Negative aortic and hilar contours. Right paratracheal fullness and convexity has a stable appearance since 2012 chest x-ray and thus is likely ectatic vessels. Chronic elevation the left diaphragm. There is no  edema, consolidation, effusion, or pneumothorax. IMPRESSION: No acute finding.  Stable compared to 2012. Electronically Signed   By: Monte Fantasia M.D.   On: 03/15/2015 04:51     Scheduled Meds: . cloNIDine  0.1 mg Oral Daily  . clopidogrel  75 mg Oral Daily  . furosemide  20 mg Oral Daily  . lisinopril  40 mg Oral Daily  . NIFEdipine  60 mg Oral Daily  . phenobarbital  97.2 mg Oral QHS  . potassium chloride  20 mEq Oral Daily  . rosuvastatin  5 mg Oral q1800  . sodium chloride flush  3 mL Intravenous Q12H   Continuous Infusions:    Marzetta Board, MD, PhD [facility] Pager [phone]  If 7PM-7AM, please contact night-coverage [email or web address] Password [facility] 03/16/2015, 12:31 PM

## 2015-03-16 NOTE — Plan of Care (Signed)
Problem: Safety: Goal: Ability to remain free from injury will improve Outcome: Progressing Safe environment being provided per staff this shift.

## 2015-03-16 NOTE — Plan of Care (Signed)
Problem: Education: Goal: Knowledge of  General Education information/materials will improve Outcome: Progressing RN reviewed plan of care with patient.  Patient stated understanding.  RN has educated patient on calling and waiting for staff assistance before ambulating or transferring from bed/chair/bedside commode.  Patient has stated understanding and thus far this shift has called and waited for staff assistance before transferring from bed to chair to bedside commode and back to chair.  RN provided medication education on all medications that have been administered thus far this shift.

## 2015-03-17 ENCOUNTER — Inpatient Hospital Stay (HOSPITAL_COMMUNITY): Payer: Medicare Other

## 2015-03-17 DIAGNOSIS — I4891 Unspecified atrial fibrillation: Secondary | ICD-10-CM

## 2015-03-17 DIAGNOSIS — G459 Transient cerebral ischemic attack, unspecified: Secondary | ICD-10-CM

## 2015-03-17 LAB — ECHOCARDIOGRAM COMPLETE
HEIGHTINCHES: 69 in
WEIGHTICAEL: 3563.2 [oz_av]

## 2015-03-17 MED ORDER — ALBUTEROL SULFATE (2.5 MG/3ML) 0.083% IN NEBU
2.5000 mg | INHALATION_SOLUTION | RESPIRATORY_TRACT | Status: DC | PRN
  Administered 2015-03-17: 2.5 mg via RESPIRATORY_TRACT
  Filled 2015-03-17: qty 3

## 2015-03-17 MED ORDER — BUDESONIDE 0.25 MG/2ML IN SUSP
0.2500 mg | Freq: Two times a day (BID) | RESPIRATORY_TRACT | Status: DC
  Administered 2015-03-17 – 2015-03-19 (×4): 0.25 mg via RESPIRATORY_TRACT
  Filled 2015-03-17 (×5): qty 2

## 2015-03-17 NOTE — Evaluation (Signed)
Physical Therapy Evaluation Patient Details Name: Arthur Wood MRN: [id number] DOB: 10-16-1931 Today's Date: 03/17/2015   History of Present Illness  Pt is a 82 y/o M admitted w/ transient confusion.  MRI reveals Lt sphenoid wing meningioma w/ surrounding edema.  Pt's PMH includes stroke x3, seizures, asthma, a-fib.  Clinical Impression  Pt admitted with above diagnosis. Pt currently with functional limitations due to the deficits listed below (see PT Problem List). Mr. Colaluca reports that he needed assist w/ bathing PTA.  He lives w/ his wife who uses a WC and RW and a son who is disabled. Unclear on how much assist available at home. Pt currently requires mod assist for sit<>stand and stand pivot transfers and will need SNF level of care at d/c. Pt will benefit from skilled PT to increase their independence and safety with mobility to allow discharge to the venue listed below.      Follow Up Recommendations SNF;Supervision/Assistance - 24 hour    Equipment Recommendations  Other (comment) (TBD at next venue of care)    Recommendations for Other Services OT consult     Precautions / Restrictions Precautions Precautions: Fall Precaution Comments: incontinent of urine Restrictions Weight Bearing Restrictions: No      Mobility  Bed Mobility               General bed mobility comments: Pt sitting EOB upon PT arrival  Transfers Overall transfer level: Needs assistance Equipment used: Rolling walker (2 wheeled) Transfers: Sit to/from [facility] Sit to Stand: Mod assist Stand pivot transfers: Mod assist       General transfer comment: Assist to boost up to standing and to steady.  Pt braces legs on bed when standing and rocks for momentum.  Cues for hand placement to sit.  Pt becomes incontinent of urine, sit>stand from bed x2.  Ambulation/Gait                Stairs            Wheelchair Mobility    Modified Rankin (Stroke Patients  Only)       Balance Overall balance assessment: Needs assistance Sitting-balance support: Bilateral upper extremity supported;Feet supported Sitting balance-Leahy Scale: Fair Sitting balance - Comments: Min guard assist for safety   Standing balance support: Bilateral upper extremity supported;During functional activity Standing balance-Leahy Scale: Poor Standing balance comment: Relies on UE support                              Pertinent Vitals/Pain Pain Assessment: No/denies pain    Home Living Family/patient expects to be discharged to:: Unsure                 Additional Comments: Pt poor historian but reports he lives at home w/ his wife and son.  His wife uses a RW and WC.  His son is disabled, pt reports "it's complicated" when asked to expand.     Prior Function Level of Independence: Needs assistance   Gait / Transfers Assistance Needed: Uses cane  ADL's / Homemaking Assistance Needed: Needs assist from his wife w/ washing his hair.  Son cleans and wife cooks.        Hand Dominance   Dominant Hand: Right    Extremity/Trunk Assessment   Upper Extremity Assessment: RUE deficits/detail RUE Deficits / Details: residual weakness and impaired sensation from h/o stroke   RUE Sensation: decreased light touch  Lower Extremity Assessment: RLE deficits/detail;LLE deficits/detail RLE Deficits / Details: DF 2/5 and AROM limited to ~ (-)10 deg; Knee flexion 2/5, Knee extension 3/5 LLE Deficits / Details: strength grossly 4/5  Cervical / Trunk Assessment: Kyphotic  Communication   Communication: Other (comment) (dysarthric)  Cognition Arousal/Alertness: Awake/alert Behavior During Therapy: WFL for tasks assessed/performed Overall Cognitive Status: Difficult to assess                      General Comments      Exercises        Assessment/Plan    PT Assessment Patient needs continued PT services  PT Diagnosis Difficulty  walking;Abnormality of gait;Generalized weakness;Hemiplegia dominant side   PT Problem List Decreased strength;Decreased range of motion;Decreased activity tolerance;Decreased balance;Decreased mobility;Decreased knowledge of use of DME;Decreased safety awareness;Impaired sensation  PT Treatment Interventions DME instruction;Gait training;Functional mobility training;Therapeutic activities;Therapeutic exercise;Balance training;Neuromuscular re-education;Cognitive remediation;Patient/family education   PT Goals (Current goals can be found in the Care Plan section) Acute Rehab PT Goals Patient Stated Goal: to go home PT Goal Formulation: With patient Time For Goal Achievement: 04/07/15 Potential to Achieve Goals: Good    Frequency Min 3X/week   Barriers to discharge   Unclear on level of assist available at home    Co-evaluation               End of Session Equipment Utilized During Treatment: Gait belt Activity Tolerance: Patient limited by fatigue;Patient tolerated treatment well Patient left: in chair;with call bell/phone within reach;with chair alarm set Nurse Communication: Mobility status         Time: 1130-1205 PT Time Calculation (min) (ACUTE ONLY): 35 min   Charges:   PT Evaluation $PT Eval Moderate Complexity: 1 Procedure PT Treatments $Therapeutic Activity: 8-22 mins   PT G Codes:       Collie Siad PT, DPT  Pager: [phone] Phone: [phone] 03/17/2015, 1:02 PM

## 2015-03-17 NOTE — NC FL2 (Signed)
[place] LEVEL OF CARE SCREENING TOOL     IDENTIFICATION  Patient Name: Arthur Wood Birthdate: 1931-08-17 Sex: male Admission Date (Current Location): 03/15/2015  [facility] and [place] Number:  Herbalist and Address:  [facility], [place] [address] [place], [place] [address]      Provider Number: [id number]  Attending Physician Name and Address:  Caren Griffins, MD  Relative Name and Phone Number:       Current Level of Care: Hospital Recommended Level of Care: [place] Prior Approval Number:    Date Approved/Denied:   PASRR Number: [id number]  Discharge Plan: SNF    Current Diagnoses: Patient Active Problem List   Diagnosis Date Noted  . Altered mental status 03/15/2015  . Atrial fibrillation ([place]) 03/15/2015  . Hypertension 03/15/2015  . Asthma 03/15/2015  . Acute encephalopathy 03/15/2015  . Renal mass, left     Orientation RESPIRATION BLADDER Height & Weight     Self, Time, Situation, Place  Normal Incontinent Weight: 222 lb 11.2 oz (101.016 kg) Height:  5\' 9"  (175.3 cm)  BEHAVIORAL SYMPTOMS/MOOD NEUROLOGICAL BOWEL NUTRITION STATUS      Incontinent Diet (see DC summary)  AMBULATORY STATUS COMMUNICATION OF NEEDS Skin   Extensive Assist Verbally Normal                       Personal Care Assistance Level of Assistance  Bathing, Dressing Bathing Assistance: Maximum assistance   Dressing Assistance: Maximum assistance     Functional Limitations Info  Sight Sight Info: Impaired        SPECIAL CARE FACTORS FREQUENCY  PT (By licensed PT), OT (By licensed OT)     PT Frequency: 5/wk OT Frequency: 5/wk            Contractures      Additional Factors Info  Code Status, Allergies Code Status Info: FULL Allergies Info: NKA           Current Medications (03/17/2015):  This is the current hospital active medication list Current Facility-Administered Medications   Medication Dose Route Frequency Provider Last Rate Last Dose  . 0.9 %  sodium chloride infusion  250 mL Intravenous PRN Rondel Jumbo, PA-C      . albuterol (PROVENTIL) (2.5 MG/3ML) 0.083% nebulizer solution 2.5 mg  2.5 mg Inhalation Q4H PRN Caren Griffins, MD   2.5 mg at 03/17/15 1534  . budesonide (PULMICORT) nebulizer solution 0.25 mg  0.25 mg Nebulization BID Caren Griffins, MD   0.25 mg at 03/17/15 0937  . cloNIDine (CATAPRES) tablet 0.1 mg  0.1 mg Oral Daily Rondel Jumbo, PA-C   0.1 mg at 03/17/15 1008  . clopidogrel (PLAVIX) tablet 75 mg  75 mg Oral Daily Rondel Jumbo, PA-C   75 mg at 03/17/15 1007  . furosemide (LASIX) tablet 20 mg  20 mg Oral Daily Rondel Jumbo, PA-C   20 mg at 03/17/15 1008  . lisinopril (PRINIVIL,ZESTRIL) tablet 40 mg  40 mg Oral Daily Rondel Jumbo, PA-C   40 mg at 03/17/15 1007  . morphine 2 MG/ML injection 1 mg  1 mg Intravenous Q4H PRN Rondel Jumbo, PA-C      . NIFEdipine (PROCARDIA-XL/ADALAT CC) 24 hr tablet 60 mg  60 mg Oral Daily Rondel Jumbo, PA-C   60 mg at 03/17/15 1007  . ondansetron (ZOFRAN) tablet 4 mg  4 mg Oral Q6H PRN Coralee Pesa  Wertman, PA-C       Or  . ondansetron (ZOFRAN) injection 4 mg  4 mg Intravenous Q6H PRN Rondel Jumbo, PA-C      . PHENobarbital (LUMINAL) tablet 97.2 mg  97.2 mg Oral QHS Kendra P Hiatt, RPH   97.2 mg at 03/16/15 2213  . potassium chloride (K-DUR) CR tablet 20 mEq  20 mEq Oral Daily Rondel Jumbo, PA-C   20 mEq at 03/17/15 1007  . rosuvastatin (CRESTOR) tablet 5 mg  5 mg Oral q1800 Rondel Jumbo, PA-C   5 mg at 03/16/15 0854  . sodium chloride flush (NS) 0.9 % injection 3 mL  3 mL Intravenous Q12H Rondel Jumbo, PA-C   3 mL at 03/17/15 1010  . sodium chloride flush (NS) 0.9 % injection 3 mL  3 mL Intravenous PRN Rondel Jumbo, PA-C         Discharge Medications: Please see discharge summary for a list of discharge medications.  Relevant Imaging Results:  Relevant Lab Results:   Additional  Information SS#: [id number]  Cranford Mon, [place]

## 2015-03-17 NOTE — Progress Notes (Signed)
EEG completed; results pending.    

## 2015-03-17 NOTE — Progress Notes (Signed)
PROGRESS NOTE  Arthur Wood [id number] DOB: [birth year] DOA: 03/15/2015 PCP: Kandice Hams, MD Outpatient Specialists:    LOS: 2 days   Brief Narrative: Arthur Wood is a 82 y.o. 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 Around 10 pm, accompanied by progressive lethargy. Per wife report, last seen interactive around 1 am.No nausea or vomiting was noted. No dizziness. No vision changes. No seizures. No apparent cardiac, respiratory complaints or diaphoresis. No sick contacts. No recent travels. Had urine incontinence. Symptoms are beginning to clear, returning to his baseline.  Assessment & Plan: Active Problems:   Altered mental status   Atrial fibrillation (HCC)   Hypertension   Asthma   Acute encephalopathy   Acute encephalopathy  - Etiology unclear, CT scan with showed vasogenic type edema in the left temporal lobe primarily as with mass vs infection such as herpes encephalitis. He was started on antivirals based on CT scan. Patient was without fevers, headaches, no neck pain, meningismus. No photophobia, no nausea. MRI showed 4.2 cm left sphenoid wing meningioma with cavernous sinus invasion, mild left optic nerve mass effect, and mild-to-moderate brain edema. Less like infection given MRI findings. Consulted neurosurgery, Dr. Annette Stable evaluated patient, no indication for surgery nor steroids, findings likely chronic, suggested to complete TIA workup - EEG still pending - TIA workup with 2D echo still pending - carotids without significant ICA stenosis - TSH normal, B12 normal, Ammonia normal - back to baseline  - PT recommending SNF, consulted SW. Patient refusing later in the day, CM consulted for [facility]  Atrial Fibrillation not on anticoagulants due to history of brain hemorrhage while on Coumadin in  1998 -2D Echo  -Continue home meds   Hypertension - Blood pressure quite elevated on admission now better controlled  Asthma, likely COPD with regular episodes of bronchitis - This is stable, Osats normal on RA, no acute changes. CXR NAD  History of L RCC, not on treatment - CT abdomen without new suspicious masses   DVT prophylaxis: SCD Code Status: Full Family Communication: d/w wife bedside Disposition Plan: home 1 day  Barriers for discharge: TIA workup incomplete  Consultants:   Neurosurgery   Procedures:   2D echo: pending  Carotids: no significant ICA stenosis  EEG: pending  Antimicrobials:  None    Subjective: No complaints this morning, no HA, no nausea, denies photophobia  Objective: Filed Vitals:   03/16/15 2100 03/17/15 0531 03/17/15 1313 03/17/15 1536  BP: 152/78 137/80 132/74   Pulse: 63 80 73   Temp: 97.6 F (36.4 C) 97.5 F (36.4 C) 98.6 F (37 C)   TempSrc: Oral Oral Oral   Resp:      Height:      Weight:  101.016 kg (222 lb 11.2 oz)    SpO2: 99% 95% 97% 95%    Intake/Output Summary (Last 24 hours) at 03/17/15 1623 Last data filed at 03/16/15 2213  Gross per 24 hour  Intake    100 ml  Output      0 ml  Net    100 ml   Filed Weights   03/15/15 1616 03/16/15 0500 03/17/15 0531  Weight: 101.515 kg (223 lb 12.8 oz) 100.699 kg (222 lb) 101.016 kg (222 lb 11.2 oz)    Examination: BP 132/74 mmHg  Pulse 73  Temp(Src) 98.6 F (37 C) (Oral)  Resp  18  Ht 5\' 9"  (1.753 m)  Wt 101.016 kg (222 lb 11.2 oz)  BMI 32.87 kg/m2  SpO2 95%  GENERAL: NAD  HEENT: head NCAT, no scleral icterus.   NECK: Supple. No carotid bruits. No lymphadenopathy or thyromegaly.  LUNGS: Clear to auscultation. No wheezing or crackles  HEART: Regular rate and rhythm without murmur. 2+ pulses, no JVD, 2+ pitting edema  ABDOMEN: Soft, nontender, and nondistended. Positive bowel sounds.   EXTREMITIES: Without any cyanosis or clubbing  NEUROLOGIC: non  focal    Data Reviewed: I have personally reviewed following labs and imaging studies  CBC:  Recent Labs Lab 03/15/15 0434 03/15/15 0923 03/16/15 0406  WBC 5.9 6.2 6.8  NEUTROABS 3.0  --   --   HGB 13.7 14.1 13.3  HCT 41.9 42.0 41.3  MCV 91.9 91.1 92.0  PLT 166 162 0000000   Basic Metabolic Panel:  Recent Labs Lab 03/15/15 0434 03/15/15 0923 03/16/15 0406  NA 143 143 142  K 4.5 4.0 4.0  CL 102 105 105  CO2 28 27 29   GLUCOSE 120* 119* 124*  BUN 16 13 15   CREATININE 0.92 0.82 0.99  CALCIUM 9.0 8.9 8.9   GFR: Estimated Creatinine Clearance: 68.5 mL/min (by C-G formula based on Cr of 0.99). Liver Function Tests:  Recent Labs Lab 03/15/15 0434 03/15/15 0923  AST 22 19  ALT 15* 15*  ALKPHOS 80 81  BILITOT 0.4 0.7  PROT 6.4* 6.2*  ALBUMIN 3.9 3.7   No results for input(s): LIPASE, AMYLASE in the last 168 hours.  Recent Labs Lab 03/15/15 0451  AMMONIA 26   Coagulation Profile:  Recent Labs Lab 03/15/15 0434  INR 1.07   Cardiac Enzymes:  Recent Labs Lab 03/15/15 0434  TROPONINI <0.03   BNP (last 3 results) No results for input(s): PROBNP in the last 8760 hours. HbA1C: No results for input(s): HGBA1C in the last 72 hours. CBG: No results for input(s): GLUCAP in the last 168 hours. Lipid Profile: No results for input(s): CHOL, HDL, LDLCALC, TRIG, CHOLHDL, LDLDIRECT in the last 72 hours. Thyroid Function Tests:  Recent Labs  03/15/15 0923  TSH 2.490   Anemia Panel:  Recent Labs  03/15/15 0923  VITAMINB12 474   Urine analysis:    Component Value Date/Time   COLORURINE YELLOW 03/15/2015 [place] 03/15/2015 1035   LABSPEC 1.009 03/15/2015 1035   PHURINE 7.0 03/15/2015 1035   GLUCOSEU NEGATIVE 03/15/2015 1035   HGBUR NEGATIVE 03/15/2015 1035   BILIRUBINUR NEGATIVE 03/15/2015 1035   KETONESUR NEGATIVE 03/15/2015 1035   PROTEINUR NEGATIVE 03/15/2015 1035   UROBILINOGEN 1.0 01/11/2010 0818   NITRITE NEGATIVE  03/15/2015 1035   LEUKOCYTESUR NEGATIVE 03/15/2015 1035   Sepsis Labs: Invalid input(s): PROCALCITONIN, LACTICIDVEN  Recent Results (from the past 240 hour(s))  Blood culture (routine x 2)     Status: None (Preliminary result)   Collection Time: 03/15/15  4:36 AM  Result Value Ref Range Status   Specimen Description BLOOD RIGHT ARM  Final   Special Requests BOTTLES DRAWN AEROBIC AND ANAEROBIC 5CC  Final   Culture NO GROWTH 2 DAYS  Final   Report Status PENDING  Incomplete  Blood culture (routine x 2)     Status: None (Preliminary result)   Collection Time: 03/15/15  4:45 AM  Result Value Ref Range Status   Specimen Description BLOOD RIGHT HAND  Final   Special Requests IN PEDIATRIC BOTTLE 4CC  Final   Culture NO GROWTH 2  DAYS  Final   Report Status PENDING  Incomplete      Radiology Studies: No results found.   Scheduled Meds: . budesonide (PULMICORT) nebulizer solution  0.25 mg Nebulization BID  . cloNIDine  0.1 mg Oral Daily  . clopidogrel  75 mg Oral Daily  . furosemide  20 mg Oral Daily  . lisinopril  40 mg Oral Daily  . NIFEdipine  60 mg Oral Daily  . phenobarbital  97.2 mg Oral QHS  . potassium chloride  20 mEq Oral Daily  . rosuvastatin  5 mg Oral q1800  . sodium chloride flush  3 mL Intravenous Q12H   Continuous Infusions:    Marzetta Board, MD, PhD [facility] Pager [phone]  If 7PM-7AM, please contact night-coverage [email or web address] Password [facility] 03/17/2015, 4:23 PM

## 2015-03-17 NOTE — Progress Notes (Signed)
PT Cancellation Note  Patient Details Name: Arthur Wood MRN: [id number] DOB: [birth year]   Cancelled Treatment:    Reason Eval/Treat Not Completed: Patient at procedure or test/unavailable (pt off floor at vascular lab).  PT will continue to follow acutely.  Collie Siad PT, DPT  Pager: [phone] Phone: [phone] 03/17/2015, 9:28 AM

## 2015-03-17 NOTE — Procedures (Signed)
History: Charl Mengesha is an 82 y.o. male patient with altered mental status. Routine inpatient EEG was performed for further evaluation.   Patient Active Problem List   Diagnosis Date Noted  . Altered mental status 03/15/2015  . Atrial fibrillation ([place]) 03/15/2015  . Hypertension 03/15/2015  . Asthma 03/15/2015  . Acute encephalopathy 03/15/2015  . Renal mass, left      Current facility-administered medications:  .  0.9 %  sodium chloride infusion, 250 mL, Intravenous, PRN, Rondel Jumbo, PA-C .  albuterol (PROVENTIL) (2.5 MG/3ML) 0.083% nebulizer solution 2.5 mg, 2.5 mg, Inhalation, Q4H PRN, Caren Griffins, MD, 2.5 mg at 03/17/15 1534 .  budesonide (PULMICORT) nebulizer solution 0.25 mg, 0.25 mg, Nebulization, BID, Caren Griffins, MD, 0.25 mg at 03/17/15 2133 .  cloNIDine (CATAPRES) tablet 0.1 mg, 0.1 mg, Oral, Daily, Rondel Jumbo, PA-C, 0.1 mg at 03/17/15 1008 .  clopidogrel (PLAVIX) tablet 75 mg, 75 mg, Oral, Daily, Rondel Jumbo, PA-C, 75 mg at 03/17/15 1007 .  furosemide (LASIX) tablet 20 mg, 20 mg, Oral, Daily, Rondel Jumbo, PA-C, 20 mg at 03/17/15 1008 .  lisinopril (PRINIVIL,ZESTRIL) tablet 40 mg, 40 mg, Oral, Daily, Rondel Jumbo, PA-C, 40 mg at 03/17/15 1007 .  morphine 2 MG/ML injection 1 mg, 1 mg, Intravenous, Q4H PRN, Rondel Jumbo, PA-C .  NIFEdipine (PROCARDIA-XL/ADALAT CC) 24 hr tablet 60 mg, 60 mg, Oral, Daily, Rondel Jumbo, PA-C, 60 mg at 03/17/15 1007 .  ondansetron (ZOFRAN) tablet 4 mg, 4 mg, Oral, Q6H PRN **OR** ondansetron (ZOFRAN) injection 4 mg, 4 mg, Intravenous, Q6H PRN, Rondel Jumbo, PA-C .  PHENobarbital (LUMINAL) tablet 97.2 mg, 97.2 mg, Oral, QHS, Kendra P Hiatt, RPH, 97.2 mg at 03/17/15 2201 .  potassium chloride (K-DUR) CR tablet 20 mEq, 20 mEq, Oral, Daily, Rondel Jumbo, PA-C, 20 mEq at 03/17/15 1007 .  rosuvastatin (CRESTOR) tablet 5 mg, 5 mg, Oral, q1800, Rondel Jumbo, PA-C, 5 mg at 03/17/15 1736 .  sodium chloride flush (NS) 0.9 %  injection 3 mL, 3 mL, Intravenous, Q12H, Coralee Pesa Wertman, PA-C, 3 mL at 03/17/15 1010 .  sodium chloride flush (NS) 0.9 % injection 3 mL, 3 mL, Intravenous, PRN, Rondel Jumbo, PA-C   Introduction:  This is a 19 channel routine scalp EEG performed at the bedside with bipolar and monopolar montages arranged in accordance to the international 10/20 system of electrode placement. One channel was dedicated to EKG recording.   Findings:  The background rhythm was normal 9-10 Hz alpha . Intermittent left frontocentral focal slowing and intermittent abnormal left central parietal epileptiform discharges in the form of sharps with phase reversals were noted . No definite evidence of electrographic seizures were noted during this recording.   Impression:  Abnormal awake and drowsy routine inpatient EEG suggestive of underlying neuronal dysfunction with epileptogenic potential in the left frontoparietal regions as described. Clinical correlation is recommended .

## 2015-03-17 NOTE — Progress Notes (Signed)
*  PRELIMINARY RESULTS* Vascular Ultrasound Carotid Duplex (Doppler) has been completed.  Preliminary findings: Bilateral: No significant (1-39%) ICA stenosis. Antegrade vertebral flow.    Landry Mellow, RDMS, RVT  03/17/2015, 10:36 AM

## 2015-03-17 NOTE — Clinical Social Work Note (Signed)
Clinical Social Work Assessment  Patient Details  Name: Arthur Wood MRN: [id number] Date of Birth: April 17, 1931  Date of referral:  03/17/15               Reason for consult:  Facility Placement                Permission sought to share information with:  Facility Sport and exercise psychologist, Family Supports Permission granted to share information::  Yes, Verbal Permission Granted  Name::        Agency::     Relationship::     Contact Information:     Housing/Transportation Living arrangements for the past 2 months:  Apartment Source of Information:  Spouse Economist) Patient Interpreter Needed:  None Criminal Activity/Legal Involvement Pertinent to Current Situation/Hospitalization:  No - Comment as needed Significant Relationships:  Adult Children, Spouse Lives with:  Adult Children, Spouse Do you feel safe going back to the place where you live?  Yes Need for family participation in patient care:  Yes (Comment)  Care giving concerns:  Patient had a seziure at home and wife is in wheelchair and son is disable. PT recommend SNF.   Social Worker assessment / plan:  Engineer, water entered room. Patient was alert and oriented with wife and son in room. BSW intern explained PT recommendations and referral process. Patient wife is not refusing SNF but is considering as a second option. Patient wife would like for patient to come home with home health set up in the home. Patient wife is in a wheelchair she can stand but not for long. Son is mentally disable.  Patient wife stated that patient does have a walker at home and potty chair. Patient says he is weak on his right side. Patient wife stated when medically ready she would like PTAR to be arranged. Per MD patient will be ready for discharge tomorrow if his results come back good.   Employment status:  Retired Forensic scientist:  Medicare PT Recommendations:  [place] / Referral to community resources:   Acute Rehab  Patient/Family's Response to care:  Patient wife and patient has good response to care.   Patient/Family's Understanding of and Emotional Response to Diagnosis, Current Treatment, and Prognosis:  Patient wife and patient has good insight on patient current condition and medical treatment.  Emotional Assessment Appearance:  Appears stated age Attitude/Demeanor/Rapport:   (nice . pleaseant) Affect (typically observed):  Calm Orientation:  Oriented to Self, Oriented to Place, Oriented to  Time, Oriented to Situation Alcohol / Substance use:  Never Used Psych involvement (Current and /or in the community):  No (Comment)  Discharge Needs  Concerns to be addressed:  No discharge needs identified Readmission within the last 30 days:  No Current discharge risk:  None Barriers to Discharge:  No Barriers Identified   [place] intern  [phone]

## 2015-03-17 NOTE — Progress Notes (Signed)
  Echocardiogram 2D Echocardiogram has been performed.  Bobbye Charleston 03/17/2015, 4:02 PM

## 2015-03-18 DIAGNOSIS — J45909 Unspecified asthma, uncomplicated: Secondary | ICD-10-CM

## 2015-03-18 DIAGNOSIS — G9340 Encephalopathy, unspecified: Secondary | ICD-10-CM

## 2015-03-18 DIAGNOSIS — R4182 Altered mental status, unspecified: Secondary | ICD-10-CM

## 2015-03-18 DIAGNOSIS — I4891 Unspecified atrial fibrillation: Secondary | ICD-10-CM

## 2015-03-18 DIAGNOSIS — I10 Essential (primary) hypertension: Secondary | ICD-10-CM

## 2015-03-18 MED ORDER — LEVETIRACETAM 500 MG PO TABS
500.0000 mg | ORAL_TABLET | Freq: Two times a day (BID) | ORAL | Status: DC
  Administered 2015-03-18 – 2015-03-19 (×2): 500 mg via ORAL
  Filled 2015-03-18 (×2): qty 1

## 2015-03-18 MED ORDER — PHENOBARBITAL 30 MG PO TABS: 90.0000 mg | ORAL_TABLET | Freq: Every evening | ORAL | Status: DC

## 2015-03-18 MED ORDER — LEVETIRACETAM 500 MG PO TABS
1000.0000 mg | ORAL_TABLET | Freq: Once | ORAL | Status: AC
  Administered 2015-03-18: 1000 mg via ORAL
  Filled 2015-03-18: qty 2

## 2015-03-18 NOTE — Consult Note (Signed)
Requesting Physician: Dr.  Tana Coast    Reason for consultation: Management of seizures, abnormal EEG  HPI:                                                                                                                                         Arthur Wood is an 82 y.o. male patient who presented with altered mental status. Patient is a poor historian. He was seen alone, no family available he lives with his wife. Patient apparently had 3 brain surgeries in 1980s, 2 on the left side and 1 on the right side all of which are believed to be from meningioma resections. 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 Resection cavity is seen in the right parietal lobe, with extensive encephalomalacia changes in the left parietotemporal lobe. EEG was performed for routine workup, which showed abnormal with left from discharges in the left frontoparietal location.  Patient does not follow-up with neurologist currently. He has been on phenobarbital 100 mg at bedtime since 1980s. Reported spending a total of 4 years in the inpatient rehabilitation facility in 80s when he had these brain surgeries. He reports having short-term memory issues, unable to specify if his been having any convulsive seizures recently. Hasn't been on any other seizure medication except for phenobarbital.    Past Medical History: Past Medical History  Diagnosis Date  . Atrial fibrillation ([place])   . Asthma   . Hypertension   . lt renal ca dx'd 03/2010  . Seizures ([place])   . Stroke [facility])     History reviewed. No pertinent past surgical history.  Family History: No family history on file.  Social History:   reports that he quit smoking about 32 years ago. His smoking use included Cigarettes. He does not have any smokeless tobacco history on file. He reports that he does not drink alcohol or use illicit drugs.  Allergies:  No Known Allergies   Medications:                                                                                                                          Current facility-administered medications:  .  0.9 %  sodium chloride infusion, 250 mL, Intravenous, PRN, Rondel Jumbo, PA-C .  albuterol (PROVENTIL) (2.5 MG/3ML) 0.083% nebulizer solution 2.5 mg, 2.5 mg, Inhalation, Q4H PRN, Caren Griffins, MD, 2.5 mg at  03/17/15 1534 .  budesonide (PULMICORT) nebulizer solution 0.25 mg, 0.25 mg, Nebulization, BID, Caren Griffins, MD, 0.25 mg at 03/18/15 0850 .  cloNIDine (CATAPRES) tablet 0.1 mg, 0.1 mg, Oral, Daily, Rondel Jumbo, PA-C, 0.1 mg at 03/18/15 0804 .  clopidogrel (PLAVIX) tablet 75 mg, 75 mg, Oral, Daily, Rondel Jumbo, PA-C, 75 mg at 03/18/15 0804 .  furosemide (LASIX) tablet 20 mg, 20 mg, Oral, Daily, Rondel Jumbo, PA-C, 20 mg at 03/18/15 0804 .  levETIRAcetam (KEPPRA) tablet 1,000 mg, 1,000 mg, Oral, Once, Tomie Spizzirri Fuller Mandril, MD .  levETIRAcetam (KEPPRA) tablet 500 mg, 500 mg, Oral, BID, Starlett Pehrson Fuller Mandril, MD .  lisinopril (PRINIVIL,ZESTRIL) tablet 40 mg, 40 mg, Oral, Daily, Rondel Jumbo, PA-C, 40 mg at 03/18/15 0804 .  morphine 2 MG/ML injection 1 mg, 1 mg, Intravenous, Q4H PRN, Rondel Jumbo, PA-C .  NIFEdipine (PROCARDIA-XL/ADALAT CC) 24 hr tablet 60 mg, 60 mg, Oral, Daily, Rondel Jumbo, PA-C, 60 mg at 03/18/15 0805 .  ondansetron (ZOFRAN) tablet 4 mg, 4 mg, Oral, Q6H PRN **OR** ondansetron (ZOFRAN) injection 4 mg, 4 mg, Intravenous, Q6H PRN, Rondel Jumbo, PA-C .  PHENobarbital (LUMINAL) tablet 97.2 mg, 97.2 mg, Oral, QHS, Kendra P Hiatt, RPH, 97.2 mg at 03/17/15 2201 .  potassium chloride (K-DUR) CR tablet 20 mEq, 20 mEq, Oral, Daily, Rondel Jumbo, PA-C, 20 mEq at 03/18/15 0804 .  rosuvastatin (CRESTOR) tablet 5 mg, 5 mg, Oral, q1800, Rondel Jumbo, PA-C, 5 mg at 03/17/15 1736 .  sodium chloride flush (NS) 0.9 % injection 3 mL, 3 mL, Intravenous, Q12H, Coralee Pesa Wertman, PA-C, 3 mL at 03/18/15 1000 .  sodium chloride flush (NS) 0.9 % injection 3 mL, 3  mL, Intravenous, PRN, Rondel Jumbo, PA-C   ROS:                                                                                                                                       History obtained from the patient  General ROS: negative for - chills, fatigue, fever, night sweats, weight gain or weight loss Psychological ROS: negative for - behavioral disorder, hallucinations, memory difficulties, mood swings or suicidal ideation Ophthalmic ROS: negative for - blurry vision, double vision, eye pain or loss of vision ENT ROS: negative for - epistaxis, nasal discharge, oral lesions, sore throat, tinnitus or vertigo Allergy and Immunology ROS: negative for - hives or itchy/watery eyes Hematological and Lymphatic ROS: negative for - bleeding problems, bruising or swollen lymph nodes Endocrine ROS: negative for - galactorrhea, hair pattern changes, polydipsia/polyuria or temperature intolerance Respiratory ROS: negative for - cough, hemoptysis, shortness of breath or wheezing Cardiovascular ROS: negative for - chest pain, dyspnea on exertion, edema or irregular heartbeat Gastrointestinal ROS: negative for - abdominal pain, diarrhea, hematemesis, nausea/vomiting or stool incontinence Genito-Urinary ROS: negative for - dysuria, hematuria, incontinence or urinary frequency/urgency Musculoskeletal  ROS: negative for - joint swelling or muscular weakness Neurological ROS: as noted in HPI Dermatological ROS: negative for rash and skin lesion changes  Neurologic Examination:                                                                                                    Today's Vitals   03/17/15 1936 03/17/15 2133 03/18/15 0457 03/18/15 0800  BP: 134/73  163/74   Pulse: 75  78   Temp: 97.9 F (36.6 C)  98.4 F (36.9 C)   TempSrc: Oral  Oral   Resp: 18  18   Height:      Weight:   100.88 kg (222 lb 6.4 oz)   SpO2: 97% 96% 97%   PainSc:    0-No pain    Evaluation of higher integrative  functions including: Level of alertness: Alert,  Oriented to time, place and person Recent and remote memory - impaired recall   Attention span and concentration  - intact Speech: minimal baseline dysarthria, no  aphasia noted.  Test the following cranial nerves: 2-12 grossly intact Motor examination:    mild 4/5 right upper and lower extremity weakness, full 5/5 motor strength inleft upper and lower extremities Examination of sensation :Reports  symmetric sensation to pinprick in all 4 extremities  Examination of deep tendon reflexes: 3+, in all extremities, normal plantars bilaterally Test coordination: Normal finger nose testing, with no evidence of limb appendicular ataxia or abnormal involuntary movements or tremors noted.          Lab Results: Basic Metabolic Panel:  Recent Labs Lab 03/15/15 0434 03/15/15 0923 03/16/15 0406  NA 143 143 142  K 4.5 4.0 4.0  CL 102 105 105  CO2 28 27 29   GLUCOSE 120* 119* 124*  BUN 16 13 15   CREATININE 0.92 0.82 0.99  CALCIUM 9.0 8.9 8.9    Liver Function Tests:  Recent Labs Lab 03/15/15 0434 03/15/15 0923  AST 22 19  ALT 15* 15*  ALKPHOS 80 81  BILITOT 0.4 0.7  PROT 6.4* 6.2*  ALBUMIN 3.9 3.7   No results for input(s): LIPASE, AMYLASE in the last 168 hours.  Recent Labs Lab 03/15/15 0451  AMMONIA 26    CBC:  Recent Labs Lab 03/15/15 0434 03/15/15 0923 03/16/15 0406  WBC 5.9 6.2 6.8  NEUTROABS 3.0  --   --   HGB 13.7 14.1 13.3  HCT 41.9 42.0 41.3  MCV 91.9 91.1 92.0  PLT 166 162 188    Cardiac Enzymes:  Recent Labs Lab 03/15/15 0434  TROPONINI <0.03    Lipid Panel: No results for input(s): CHOL, TRIG, HDL, CHOLHDL, VLDL, LDLCALC in the last 168 hours.  CBG: No results for input(s): GLUCAP in the last 168 hours.  Microbiology: Results for orders placed or performed during the hospital encounter of 03/15/15  Blood culture (routine x 2)     Status: None (Preliminary result)   Collection Time:  03/15/15  4:36 AM  Result Value Ref Range Status   Specimen Description BLOOD RIGHT ARM  Final   Special Requests  BOTTLES DRAWN AEROBIC AND ANAEROBIC 5CC  Final   Culture NO GROWTH 2 DAYS  Final   Report Status PENDING  Incomplete  Blood culture (routine x 2)     Status: None (Preliminary result)   Collection Time: 03/15/15  4:45 AM  Result Value Ref Range Status   Specimen Description BLOOD RIGHT HAND  Final   Special Requests IN PEDIATRIC BOTTLE 4CC  Final   Culture NO GROWTH 2 DAYS  Final   Report Status PENDING  Incomplete     Imaging: Ct Abdomen Pelvis Wo Contrast  03/15/2015  CLINICAL DATA:  Abdominal pain, evaluate for inguinal hernia EXAM: CT ABDOMEN AND PELVIS WITHOUT CONTRAST TECHNIQUE: Multidetector CT imaging of the abdomen and pelvis was performed following the standard protocol without IV contrast. COMPARISON:  04/08/2014 FINDINGS: Lower chest and abdominal wall: Massive right inguinal hernia containing small and large bowel which is nonobstructed. Small fatty umbilical hernia. Mild bronchial wall thickening and scarring in the right lower lobe. Hepatobiliary: No focal liver abnormality. High-density material in the gallbladder with layering calculi. Front or nodular low-density along the anterior gallbladder wall is likely cholesterolosis or floating fat and has been seen since at least 2015 CT. Pancreas: Unremarkable. Spleen: Unremarkable. Adrenals/Urinary Tract: Negative adrenals. Stable appearance of left renal mass ablation site. Small hemorrhagic cyst in the lower pole right kidney, stable. Simple density exophytic left renal cyst is also stable. Unremarkable bladder. Reproductive:Mild prostate enlargement. Stomach/Bowel: No obstruction. No appendicitis. No inflammatory bowel wall thickening. Extensive colonic diverticulosis. Vascular/Lymphatic: Diffuse atherosclerosis. No evidence of acute vascular disease. No mass or adenopathy. Peritoneal: No ascites or pneumoperitoneum.  Musculoskeletal: No acute abnormalities. IMPRESSION: 1. No acute finding. 2. Massive right inguinal hernia containing nonobstructed bowel. 3. Cholelithiasis and other stable chronic findings are described above. Electronically Signed   By: Monte Fantasia M.D.   On: 03/15/2015 05:36   Ct Head Wo Contrast  03/15/2015  CLINICAL DATA:  Altered mental status with progressive lethargy. EXAM: CT HEAD WITHOUT CONTRAST TECHNIQUE: Contiguous axial images were obtained from the base of the skull through the vertex without intravenous contrast. COMPARISON:  None. FINDINGS: Skull and Sinuses:Remote biparietal craniotomy. Mucosal thickening focally and the upper left frontal sinus, without sinus expansion. Visualized orbits: Negative. Brain: The white matter of the left temporal lobe is low-density with sulcal effacement. Appearance suggests vasogenic edema. Appearing nodular thickening at the anterior pole of the left temporal lobe could be normal cortex or mass. There is contiguous low-density in the left putamen and deep white matter tracts. 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 Well-defined, remote appearing low posterior left frontal cortical and subcortical infarct. No hydrocephalus or acute hemorrhage. There are CSF density subdural collections around the bilateral cerebral convexities at the vertex, measuring up to 13 mm in the right frontal region with mild cortical mass effect. These results were called by telephone at the time of interpretation on 03/15/2015 at 5:24 am to Dr. Ezequiel Essex , who verbally acknowledged these results. IMPRESSION: 1. Vasogenic type edema in the left temporal lobe primarily as with mass or infection. Given location and history, suggest empiric treatment for herpes encephalitis during workup. Recommend MRI with contrast. 2. Age-indeterminate left basal ganglia infarct. 3.  Remote left frontal infarct. Cystic encephalomalacia in the right temporal lobe. 4. Bilateral subdural hygroma or remote hematoma with mild mass effect in the right frontal region. Electronically Signed   By: Angelica Chessman  Watts M.D.   On: 03/15/2015 05:30   Mr Jeri Cos [id number] Contrast  03/15/2015  CLINICAL DATA:  Altered mental status. Seizure. Left temporal lobe edema on CT. History of left renal cell cancer status post ablation. EXAM: MRI HEAD WITHOUT AND WITH CONTRAST TECHNIQUE: Multiplanar, multiecho pulse sequences of the brain and surrounding structures were obtained without and with intravenous contrast. CONTRAST:  86mL MULTIHANCE GADOBENATE DIMEGLUMINE 529 MG/ML IV SOLN COMPARISON:  Head CT 03/15/2015 FINDINGS: No definite acute infarct is identified. There is a small focus of cortical/ subcortical trace diffusion signal abnormality in the high posterior left frontal lobe without evidence of significantly reduced ADC, likely a small chronic infarct. There is encephalomalacia in the medial left parietal lobe with prominent chronic hemosiderin deposition and ex vacuo dilatation of the left lateral ventricle. The there is also hemosiderin staining involving several right parietal sulci compatible with remote subarachnoid hemorrhage. Numerous chronic parenchymal microhemorrhages are present in the cerebellum, brainstem, thalami, and scattered throughout both cerebral hemispheres. Chronic blood products are also noted associated with a chronic left frontal opercular infarct as well as with a large area of cystic encephalomalacia involving the right temporal lobe. Asymmetric CSF is noted over both frontal convexities and along the left interhemispheric fissure, measuring up to 15 mm in thickness anterior to the right frontal lobe with mild cortical mass effect and overall suggestive of subdural hygromas. There is a homogeneously enhancing extra-axial mass with broad dural base arising from the region of the left  anterior clinoid process and sphenoid wing. The mass measures approximately 4.2 x 2.3 x 3.1 cm (oblique AP by transverse by craniocaudal). The mass extends anterolaterally along the sphenoid wing in the middle cranial fossa. There is involvement of the left cavernous sinus. The mass contacts and slightly displaces the pre chiasmatic left optic nerve without frank compression or encasement. There is mild-to-moderate vasogenic edema in the left temporal and inferior left frontal lobe with involvement of the adjacent deep white matter tracts. There is a round 11 mm homogeneously enhancing extra-axial mass associated with the dura just below the torcula without associated cerebellar edema or mass effect. No evidence of sinus invasion. Prominent dilated perivascular spaces are present throughout the basal ganglia bilaterally. Chronic thalamic lacunar infarcts are noted. Sequelae of prior bilateral parietal craniotomies are identified. There is a mildly to moderately expanded, partially empty sella. Prior bilateral cataract extraction is noted. There is a small right maxillary sinus mucous retention cyst. The mastoid air cells are clear. Major intracranial vascular flow voids are preserved. IMPRESSION: 1. 4.2 cm left sphenoid wing meningioma with cavernous sinus invasion, mild left optic nerve mass effect, and mild-to-moderate brain edema. 2. 11 mm posterior fossa meningioma. 3. Areas of chronic encephalomalacia, chronic hemorrhages, and subdural hygromas as above. Electronically Signed   By: Logan Bores M.D.   On: 03/15/2015 09:54   Dg Chest Portable 1 View  03/15/2015  CLINICAL DATA:  Altered mental status EXAM: PORTABLE CHEST 1 VIEW COMPARISON:  01/11/2010 FINDINGS: Borderline cardiomegaly is stable, accentuated by low volumes. Negative aortic and hilar contours. Right paratracheal fullness and convexity has a stable appearance since 2012 chest x-ray and thus is likely ectatic vessels. Chronic elevation the left  diaphragm. There is no edema, consolidation, effusion, or pneumothorax. IMPRESSION: No acute finding.  Stable compared to 2012. Electronically Signed   By: Monte Fantasia M.D.   On: 03/15/2015 04:51      Assessment and plan:   Arthur Wood is an 82 y.o. male  patient  with at least 3 reported brain surgeries for meningioma resections in the past bilaterally , with a recent MRI showing resection cavity in the right hemisphere, and extensive encephalomalacia changes in the left temporal lobe, residual left sphenoid wing meningioma causing left optic nerve edema, a tiny right sparing meningiomas noted in addition to a small posterior fossa meningioma. The reason for neurological consultation is for management of possible seizures causing his altered mental status which led to this admission.  EEG done yesterday was abnormal with frequent left frontoparietal epileptiform discharges. Patient has been only on phenobarbital 100 mg at bedtime for the past 30+ years with no neurology follow-up.  recommend to Start Keppra 1 gm one dose now,  followed by maintenance dose of 500 mg BID starting tonight. If he tolerates well without side effects, can be discharged home on Keppra 500 twice a day. Recommend continued follow-up with outpatient neurology for further dose titration based on his clinical symptoms.  Recommend neurosurgical follow-up as well with regard to the large left sphenoid wing meningioma.  We'll follow-up.

## 2015-03-18 NOTE — Clinical Social Work Placement (Signed)
   CLINICAL SOCIAL WORK PLACEMENT  NOTE  Date:  03/18/2015  Patient Details  Name: Arthur Wood MRN: [id number] Date of Birth: 01/13/1931  Clinical Social Work is seeking post-discharge placement for this patient at the [place] level of care (*CSW will initial, date and re-position this form in  chart as items are completed):  Yes   Patient/family provided with [place] Work Department's list of facilities offering this level of care within the geographic area requested by the patient (or if unable, by the patient's family).  Yes   Patient/family informed of their freedom to choose among providers that offer the needed level of care, that participate in Medicare, Medicaid or managed care program needed by the patient, have an available bed and are willing to accept the patient.  Yes   Patient/family informed of [place]'s ownership interest in [facility] and [facility] as well as of the fact that they are under no obligation to receive care at these facilities.  PASRR submitted to EDS on 03/17/15     PASRR number received on 03/17/15     Existing PASRR number confirmed on       FL2 transmitted to all facilities in geographic area requested by pt/family on 03/18/15     FL2 transmitted to all facilities within larger geographic area on       Patient informed that his/her managed care company has contracts with or will negotiate with certain facilities, including the following:        Yes   Patient/family informed of bed offers received.  Patient chooses bed at [facility] and [place] recommends and patient chooses bed at      Patient to be transferred to [facility] on  .  Patient to be transferred to facility by       Patient family notified on   of transfer.  Name of family member notified:        PHYSICIAN Please sign FL2, Please prepare priority discharge summary, including  medications, Please prepare prescriptions     Additional Comment:    _______________________________________________ Rigoberto Noel, LCSW 03/18/2015, 5:07 PM

## 2015-03-18 NOTE — Plan of Care (Signed)
Problem: Education: Goal: Knowledge of [place] [facility] Education information/materials will improve Outcome: Progressing Pts EEG+, nuero consulted,. kepra 500mg  ordered bid, 1000mg  loading dose given. Pt educated on medication. Has no questions at this time. Will monitor overnight.

## 2015-03-18 NOTE — Progress Notes (Signed)
Physical Therapy Treatment Patient Details Name: Arthur Wood MRN: [id number] DOB: 03-01-1931 Today's Date: 03/18/2015    History of Present Illness Pt is a 82 y/o M admitted w/ transient confusion.  MRI reveals Lt sphenoid wing meningioma w/ surrounding edema.  Pt's PMH includes stroke x3, seizures, asthma, a-fib.    PT Comments    Pt performed increased gait distance with cues for safety, R foot drop noted during gait training.  Pt difficult to understand during tx.  Pt remains impulsive.  Remains to require SNF placement, mod +2 for transfers, and min/mod assist for gait training.    Follow Up Recommendations  SNF;Supervision/Assistance - 24 hour     Equipment Recommendations  Other (comment)    Recommendations for Other Services       Precautions / Restrictions Precautions Precautions: Fall Precaution Comments: incontinent of urine Restrictions Weight Bearing Restrictions: No    Mobility  Bed Mobility               General bed mobility comments: Pt sitting in recliner chair with feet in dependent position on arrival.    Transfers Overall transfer level: Needs assistance Equipment used: Rolling walker (2 wheeled) Transfers: Sit to/from Stand Sit to Stand: Mod assist;+2 physical assistance (required +2 assist for safety and impulsivity ascending from recliner chair.  )         General transfer comment: Pt required mod +2 to ascend from seated surface and mod assist +1 to descend from seated surface.  Pt required cueing for safety to hand placement, forward weight shifting and weight bearing anteriorly through B feet.    Ambulation/Gait Ambulation/Gait assistance: Min assist   Assistive device: Rolling walker (2 wheeled) Gait Pattern/deviations: Step-through pattern;Trunk flexed;Drifts right/left;Decreased stride length;Wide base of support   Gait velocity interpretation: Below normal speed for age/gender General Gait Details: Pt has foot drop on R LE with  ER on R hip which runs into RW at times.  Pt presents with wide BOS, likely in response to scrotal edema.  Pt required cues to decreased BOS, gaze forward, and for B foot clearance R>L.     Stairs            Wheelchair Mobility    Modified Rankin (Stroke Patients Only)       Balance     Sitting balance-Leahy Scale: Good       Standing balance-Leahy Scale: Fair                      Cognition Arousal/Alertness: Awake/alert Behavior During Therapy: WFL for tasks assessed/performed Overall Cognitive Status: Difficult to assess                      Exercises      General Comments        Pertinent Vitals/Pain Pain Assessment: No/denies pain    Home Living                      Prior Function            PT Goals (current goals can now be found in the care plan section) Acute Rehab PT Goals Patient Stated Goal: to go home Potential to Achieve Goals: Good Progress towards PT goals: Progressing toward goals    Frequency  Min 3X/week    PT Plan      Co-evaluation             End of Session Equipment Utilized  During Treatment: Gait belt Activity Tolerance: Patient limited by fatigue;Patient tolerated treatment well Patient left: in chair;with call bell/phone within reach;with chair alarm set     Time: [id number] PT Time Calculation (min) (ACUTE ONLY): 14 min  Charges:  $Gait Training: 8-22 mins                    G Codes:      Cristela Blue 03/29/2015, 12:06 PM  Governor Rooks, PTA pager [phone]

## 2015-03-18 NOTE — Clinical Social Work Note (Signed)
Patient and wife have chosen [facility]. Facility is prepared to admit patient once he is medically stable for DC.   Liz Beach MSW, [place], [place], [id number]

## 2015-03-18 NOTE — Plan of Care (Signed)
Problem: Physical Regulation: Goal: Ability to maintain clinical measurements within normal limits will improve Outcome: Progressing Patient continues to be alert and oriented x4.  Patient has been progressing activity by sitting in the chair and ambulating to and from bathroom with use of front wheeled walker and staff assistance.  Patient displays no signs or symptoms of infection.

## 2015-03-18 NOTE — Clinical Documentation Improvement (Signed)
Internal Medicine  Please update your documentation within the medical record to reflect your response to this query. Thank you  Abnormal Lab/Test Results:  MRI Findings Can abnormal diagnostic findings be further clarified with possible, likely, probable condition as noted.   Possible Clinical Conditions associated with below indicators  Brain Edema  Cerebral Edema  Other Condition  Cannot Clinically Determine  Supporting Information: 03/17/15 progr note..." MRI showed 4.2 cm left sphenoid wing meningioma with cavernous sinus invasion, mild left optic nerve mass effect, and mild-to-moderate brain edema. Less like infection given MRI findings. Consulted neurosurgery, Dr. Annette Stable evaluated patient, no indication for surgery nor steroids, findings likely chronic, suggested to complete TIA workup..."  Treatment Provided: See above note  Please exercise your independent, professional judgment when responding. A specific answer is not anticipated or expected.  Thank You,  Ermelinda Das, RN, BSN, [place] Certified Clinical Documentation Specialist [place]: Health Information Management [phone]

## 2015-03-18 NOTE — Progress Notes (Signed)
[facility]                                                                              Patient Demographics  Arthur Wood, is a 82 y.o. male, DOB - May 27, 1931, [id number]  Admit date - 03/15/2015   Admitting Physician Ivor Costa, MD  Outpatient Primary MD for the patient is Kandice Hams, MD  LOS - 3  days    Chief Complaint  Patient presents with  . ams        Brief HPI   Arthur Wood is a 82 y.o. 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 Around 10 pm, accompanied by progressive lethargy. Per wife report, last seen interactive around 1 am.No nausea or vomiting was noted. No dizziness. No vision changes. No seizures. No apparent cardiac, respiratory complaints or diaphoresis. No sick contacts. No recent travels. Had urine incontinence. Symptoms are beginning to clear, returning to his baseline.   Assessment & Plan   Acute encephalopathy possibly due to TIA versus seizure - CT scan with showed vasogenic type edema in the left temporal lobe primarily as with mass vs infection such as herpes encephalitis. He was started on antivirals based on CT scan. Patient was without fevers, headaches, no neck pain, meningismus. No photophobia, no nausea. MRI showed 4.2 cm left sphenoid wing meningioma with cavernous sinus invasion, mild left optic nerve mass effect, and mild-to-moderate brain edema. Less like infection given MRI findings. - Neurosurgery was consulted, Dr. Annette Stable evaluated patient, no plans of surgery as a sphenoid wing meningioma resection would be high morbidity given his tenuous medical status.  - 2-D echo showed EF of 55-60% with grade 1 diastolic dysfunction - carotids without significant ICA stenosis - TSH normal, B12 normal, Ammonia normal -EEG showed underlying neuronal  dysfunction with epileptogenic potential in the left frontoparietal region, discussed and consulted neurology, Dr. Silverio Decamp. Patient is on phenobarbital, needs a second antiseizure agent? Steroids? Will await neurology recommendations.  Atrial Fibrillation not on anticoagulants due to history of brain hemorrhage while on Coumadin in 1998 -2D Echo showed EF of 55-60% with grade 1 diastolic dysfunction -Continue home meds   Hypertension - Blood pressure quite elevated on admission now better controlled  Asthma, likely COPD with regular episodes of bronchitis - This is stable, sats normal on RA, no acute changes.   History of L RCC, not on treatment - CT abdomen without new suspicious masses   Code Status: Full code   Family Communication: Discussed in detail with the patient, all imaging results, lab results explained to the patient   Disposition Plan: Awaiting neurology recommendations   Time Spent in minutes   25 minutes  Procedures  EEG   Consults   Neurology   neurosurgery  DVT Prophylaxis   SCD's  Medications  Scheduled Meds: . budesonide (PULMICORT) nebulizer solution  0.25 mg Nebulization BID  . cloNIDine  0.1 mg Oral Daily  . clopidogrel  75 mg Oral Daily  . furosemide  20 mg  Oral Daily  . levETIRAcetam  500 mg Oral BID  . lisinopril  40 mg Oral Daily  . NIFEdipine  60 mg Oral Daily  . phenobarbital  97.2 mg Oral QHS  . potassium chloride  20 mEq Oral Daily  . rosuvastatin  5 mg Oral q1800  . sodium chloride flush  3 mL Intravenous Q12H   Continuous Infusions:  PRN Meds:.sodium chloride, albuterol, morphine injection, ondansetron **OR** ondansetron (ZOFRAN) IV, sodium chloride flush   Antibiotics   Anti-infectives    Start     Dose/Rate Route Frequency Ordered Stop   03/15/15 1700  acyclovir (ZOVIRAX) 700 mg in dextrose 5 % 100 mL IVPB  Status:  Discontinued     700 mg 114 mL/hr over 60 Minutes Intravenous Every 8 hours 03/15/15 1628 03/16/15 0908     03/15/15 0730  acyclovir (ZOVIRAX) 800 mg in dextrose 5 % 150 mL IVPB     800 mg 166 mL/hr over 60 Minutes Intravenous  Once 03/15/15 0717 03/15/15 0838   03/15/15 0600  acyclovir (ZOVIRAX) 800 mg in dextrose 5 % 150 mL IVPB  Status:  Discontinued     800 mg 166 mL/hr over 60 Minutes Intravenous  Once 03/15/15 0545 03/15/15 0545        Subjective:   Kowen Chihuahua was seen and examined today.  Patient denies dizziness, chest pain, shortness of breath, abdominal pain, N/V/D/C, new weakness, numbess, tingling. No acute events overnight.  No fevers or chills.   Objective:   Filed Vitals:   03/17/15 1536 03/17/15 1936 03/17/15 2133 03/18/15 0457  BP:  134/73  163/74  Pulse:  75  78  Temp:  97.9 F (36.6 C)  98.4 F (36.9 C)  TempSrc:  Oral  Oral  Resp:  18  18  Height:      Weight:    100.88 kg (222 lb 6.4 oz)  SpO2: 95% 97% 96% 97%    Intake/Output Summary (Last 24 hours) at 03/18/15 1209 Last data filed at 03/18/15 0900  Gross per 24 hour  Intake    680 ml  Output      0 ml  Net    680 ml     Wt Readings from Last 3 Encounters:  03/18/15 100.88 kg (222 lb 6.4 oz)  03/08/11 97.523 kg (215 lb)  10/06/10 95.255 kg (210 lb)     Exam  General: Alert and oriented x 3, NAD  HEENT:  PERRLA, EOMI, Anicteric Sclera, mucous membranes moist.   Neck: Supple, no JVD, no masses  CVS: S1 S2 auscultated, no rubs, murmurs or gallops. Regular rate and rhythm.  Respiratory: Clear to auscultation bilaterally, no wheezing, rales or rhonchi  Abdomen: Soft, nontender, nondistended, + bowel sounds  Ext: no cyanosis clubbing or edema  Neuro: AAOx3, Cr N's II- XII. Strength 5/5 upper and lower extremities bilaterally  Skin: No rashes  Psych: Normal affect and demeanor, alert and oriented x3    Data Reviewed:  I have personally reviewed following labs and imaging studies  Micro Results Recent Results (from the past 240 hour(s))  Blood culture (routine x 2)     Status:  None (Preliminary result)   Collection Time: 03/15/15  4:36 AM  Result Value Ref Range Status   Specimen Description BLOOD RIGHT ARM  Final   Special Requests BOTTLES DRAWN AEROBIC AND ANAEROBIC 5CC  Final   Culture NO GROWTH 2 DAYS  Final   Report Status PENDING  Incomplete  Blood culture (routine x  2)     Status: None (Preliminary result)   Collection Time: 03/15/15  4:45 AM  Result Value Ref Range Status   Specimen Description BLOOD RIGHT HAND  Final   Special Requests IN PEDIATRIC BOTTLE 4CC  Final   Culture NO GROWTH 2 DAYS  Final   Report Status PENDING  Incomplete    Radiology Reports Ct Abdomen Pelvis Wo Contrast  03/15/2015  CLINICAL DATA:  Abdominal pain, evaluate for inguinal hernia EXAM: CT ABDOMEN AND PELVIS WITHOUT CONTRAST TECHNIQUE: Multidetector CT imaging of the abdomen and pelvis was performed following the standard protocol without IV contrast. COMPARISON:  04/08/2014 FINDINGS: Lower chest and abdominal wall: Massive right inguinal hernia containing small and large bowel which is nonobstructed. Small fatty umbilical hernia. Mild bronchial wall thickening and scarring in the right lower lobe. Hepatobiliary: No focal liver abnormality. High-density material in the gallbladder with layering calculi. Front or nodular low-density along the anterior gallbladder wall is likely cholesterolosis or floating fat and has been seen since at least 2015 CT. Pancreas: Unremarkable. Spleen: Unremarkable. Adrenals/Urinary Tract: Negative adrenals. Stable appearance of left renal mass ablation site. Small hemorrhagic cyst in the lower pole right kidney, stable. Simple density exophytic left renal cyst is also stable. Unremarkable bladder. Reproductive:Mild prostate enlargement. Stomach/Bowel: No obstruction. No appendicitis. No inflammatory bowel wall thickening. Extensive colonic diverticulosis. Vascular/Lymphatic: Diffuse atherosclerosis. No evidence of acute vascular disease. No mass or  adenopathy. Peritoneal: No ascites or pneumoperitoneum. Musculoskeletal: No acute abnormalities. IMPRESSION: 1. No acute finding. 2. Massive right inguinal hernia containing nonobstructed bowel. 3. Cholelithiasis and other stable chronic findings are described above. Electronically Signed   By: Monte Fantasia M.D.   On: 03/15/2015 05:36   Ct Head Wo Contrast  03/15/2015  CLINICAL DATA:  Altered mental status with progressive lethargy. EXAM: CT HEAD WITHOUT CONTRAST TECHNIQUE: Contiguous axial images were obtained from the base of the skull through the vertex without intravenous contrast. COMPARISON:  None. FINDINGS: Skull and Sinuses:Remote biparietal craniotomy. Mucosal thickening focally and the upper left frontal sinus, without sinus expansion. Visualized orbits: Negative. Brain: The white matter of the left temporal lobe is low-density with sulcal effacement. Appearance suggests vasogenic edema. Appearing nodular thickening at the anterior pole of the left temporal lobe could be normal cortex or mass. There is contiguous low-density in the left putamen and deep white matter tracts. 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 Well-defined, remote appearing low posterior left frontal cortical and subcortical infarct. No hydrocephalus or acute hemorrhage. There are CSF density subdural collections around the bilateral cerebral convexities at the vertex, measuring up to 13 mm in the right frontal region with mild cortical mass effect. These results were called by telephone at the time of interpretation on 03/15/2015 at 5:24 am to Dr. Ezequiel Essex , who verbally acknowledged these results. IMPRESSION: 1. Vasogenic type edema in the left temporal lobe primarily as with mass or infection. Given location and history, suggest empiric treatment for herpes encephalitis during workup. Recommend MRI with  contrast. 2. Age-indeterminate left basal ganglia infarct. 3. Remote left frontal infarct. Cystic encephalomalacia in the right temporal lobe. 4. Bilateral subdural hygroma or remote hematoma with mild mass effect in the right frontal region. Electronically Signed   By: Monte Fantasia M.D.   On: 03/15/2015 05:30   Mr Jeri Cos [id number] Contrast  03/15/2015  CLINICAL DATA:  Altered mental status. Seizure. Left temporal lobe edema  on CT. History of left renal cell cancer status post ablation. EXAM: MRI HEAD WITHOUT AND WITH CONTRAST TECHNIQUE: Multiplanar, multiecho pulse sequences of the brain and surrounding structures were obtained without and with intravenous contrast. CONTRAST:  10mL MULTIHANCE GADOBENATE DIMEGLUMINE 529 MG/ML IV SOLN COMPARISON:  Head CT 03/15/2015 FINDINGS: No definite acute infarct is identified. There is a small focus of cortical/ subcortical trace diffusion signal abnormality in the high posterior left frontal lobe without evidence of significantly reduced ADC, likely a small chronic infarct. There is encephalomalacia in the medial left parietal lobe with prominent chronic hemosiderin deposition and ex vacuo dilatation of the left lateral ventricle. The there is also hemosiderin staining involving several right parietal sulci compatible with remote subarachnoid hemorrhage. Numerous chronic parenchymal microhemorrhages are present in the cerebellum, brainstem, thalami, and scattered throughout both cerebral hemispheres. Chronic blood products are also noted associated with a chronic left frontal opercular infarct as well as with a large area of cystic encephalomalacia involving the right temporal lobe. Asymmetric CSF is noted over both frontal convexities and along the left interhemispheric fissure, measuring up to 15 mm in thickness anterior to the right frontal lobe with mild cortical mass effect and overall suggestive of subdural hygromas. There is a homogeneously enhancing extra-axial mass  with broad dural base arising from the region of the left anterior clinoid process and sphenoid wing. The mass measures approximately 4.2 x 2.3 x 3.1 cm (oblique AP by transverse by craniocaudal). The mass extends anterolaterally along the sphenoid wing in the middle cranial fossa. There is involvement of the left cavernous sinus. The mass contacts and slightly displaces the pre chiasmatic left optic nerve without frank compression or encasement. There is mild-to-moderate vasogenic edema in the left temporal and inferior left frontal lobe with involvement of the adjacent deep white matter tracts. There is a round 11 mm homogeneously enhancing extra-axial mass associated with the dura just below the torcula without associated cerebellar edema or mass effect. No evidence of sinus invasion. Prominent dilated perivascular spaces are present throughout the basal ganglia bilaterally. Chronic thalamic lacunar infarcts are noted. Sequelae of prior bilateral parietal craniotomies are identified. There is a mildly to moderately expanded, partially empty sella. Prior bilateral cataract extraction is noted. There is a small right maxillary sinus mucous retention cyst. The mastoid air cells are clear. Major intracranial vascular flow voids are preserved. IMPRESSION: 1. 4.2 cm left sphenoid wing meningioma with cavernous sinus invasion, mild left optic nerve mass effect, and mild-to-moderate brain edema. 2. 11 mm posterior fossa meningioma. 3. Areas of chronic encephalomalacia, chronic hemorrhages, and subdural hygromas as above. Electronically Signed   By: Logan Bores M.D.   On: 03/15/2015 09:54   Dg Chest Portable 1 View  03/15/2015  CLINICAL DATA:  Altered mental status EXAM: PORTABLE CHEST 1 VIEW COMPARISON:  01/11/2010 FINDINGS: Borderline cardiomegaly is stable, accentuated by low volumes. Negative aortic and hilar contours. Right paratracheal fullness and convexity has a stable appearance since 2012 chest x-ray and  thus is likely ectatic vessels. Chronic elevation the left diaphragm. There is no edema, consolidation, effusion, or pneumothorax. IMPRESSION: No acute finding.  Stable compared to 2012. Electronically Signed   By: Monte Fantasia M.D.   On: 03/15/2015 04:51    CBC  Recent Labs Lab 03/15/15 0434 03/15/15 0923 03/16/15 0406  WBC 5.9 6.2 6.8  HGB 13.7 14.1 13.3  HCT 41.9 42.0 41.3  PLT 166 162 188  MCV 91.9 91.1 92.0  MCH 30.0 30.6 29.6  MCHC 32.7 33.6 32.2  RDW 14.4 14.3 14.5  LYMPHSABS 2.0  --   --   MONOABS 0.6  --   --   EOSABS 0.3  --   --   BASOSABS 0.0  --   --     Chemistries   Recent Labs Lab 03/15/15 0434 03/15/15 0923 03/16/15 0406  NA 143 143 142  K 4.5 4.0 4.0  CL 102 105 105  CO2 28 27 29   GLUCOSE 120* 119* 124*  BUN 16 13 15   CREATININE 0.92 0.82 0.99  CALCIUM 9.0 8.9 8.9  AST 22 19  --   ALT 15* 15*  --   ALKPHOS 80 81  --   BILITOT 0.4 0.7  --    ------------------------------------------------------------------------------------------------------------------ estimated creatinine clearance is 68.5 mL/min (by C-G formula based on Cr of 0.99). ------------------------------------------------------------------------------------------------------------------ No results for input(s): HGBA1C in the last 72 hours. ------------------------------------------------------------------------------------------------------------------ No results for input(s): CHOL, HDL, LDLCALC, TRIG, CHOLHDL, LDLDIRECT in the last 72 hours. ------------------------------------------------------------------------------------------------------------------ No results for input(s): TSH, T4TOTAL, T3FREE, THYROIDAB in the last 72 hours.  Invalid input(s): FREET3 ------------------------------------------------------------------------------------------------------------------ No results for input(s): VITAMINB12, FOLATE, FERRITIN, TIBC, IRON, RETICCTPCT in the last 72  hours.  Coagulation profile  Recent Labs Lab 03/15/15 0434  INR 1.07    No results for input(s): DDIMER in the last 72 hours.  Cardiac Enzymes  Recent Labs Lab 03/15/15 0434  TROPONINI <0.03   ------------------------------------------------------------------------------------------------------------------ Invalid input(s): POCBNP  No results for input(s): GLUCAP in the last 72 hours.   Yeshua Stryker M.D. [facility] 03/18/2015, 12:09 PM  Pager: [phone] Between 7am to 7pm - call Pager - [phone]  After 7pm go to [email or web address] - password [email or web address]  Call night coverage person covering after 7pm

## 2015-03-18 NOTE — Care Management Important Message (Signed)
Important Message  Patient Details  Name: Arthur Wood MRN: [id number] Date of Birth: Nov 28, 1931   Medicare Important Message Given:  Yes    Jeda Pardue Abena 03/18/2015, 10:02 AM

## 2015-03-19 LAB — CBC
HEMATOCRIT: 40.5 % (ref 39.0–52.0)
HEMOGLOBIN: 13.3 g/dL (ref 13.0–17.0)
MCH: 30.4 pg (ref 26.0–34.0)
MCHC: 32.8 g/dL (ref 30.0–36.0)
MCV: 92.7 fL (ref 78.0–100.0)
PLATELETS: 171 10*3/uL (ref 150–400)
RBC: 4.37 MIL/uL (ref 4.22–5.81)
RDW: 14.5 % (ref 11.5–15.5)
WBC: 5.9 10*3/uL (ref 4.0–10.5)

## 2015-03-19 LAB — COMPREHENSIVE METABOLIC PANEL
ALT: 18 U/L (ref 17–63)
ANION GAP: 8 (ref 5–15)
AST: 20 U/L (ref 15–41)
Albumin: 3.5 g/dL (ref 3.5–5.0)
Alkaline Phosphatase: 82 U/L (ref 38–126)
BUN: 17 mg/dL (ref 6–20)
CHLORIDE: 106 mmol/L (ref 101–111)
CO2: 29 mmol/L (ref 22–32)
CREATININE: 1.1 mg/dL (ref 0.61–1.24)
Calcium: 8.9 mg/dL (ref 8.9–10.3)
Glucose, Bld: 101 mg/dL — ABNORMAL HIGH (ref 65–99)
POTASSIUM: 4.1 mmol/L (ref 3.5–5.1)
SODIUM: 143 mmol/L (ref 135–145)
Total Bilirubin: 0.4 mg/dL (ref 0.3–1.2)
Total Protein: 6.5 g/dL (ref 6.5–8.1)

## 2015-03-19 MED ORDER — NIFEDIPINE ER OSMOTIC RELEASE 60 MG PO TB24
60.0000 mg | ORAL_TABLET | Freq: Every day | ORAL | Status: AC
Start: 2015-03-19 — End: ?

## 2015-03-19 MED ORDER — CLONIDINE HCL 0.1 MG PO TABS: 0.1000 mg | ORAL_TABLET | Freq: Every day | ORAL | Status: DC

## 2015-03-19 MED ORDER — LEVETIRACETAM 500 MG PO TABS: 500.0000 mg | ORAL_TABLET | Freq: Two times a day (BID) | ORAL | Status: AC

## 2015-03-19 NOTE — Clinical Social Work Placement (Signed)
   CLINICAL SOCIAL WORK PLACEMENT  NOTE  Date:  03/19/2015  Patient Details  Name: Arthur Wood MRN: [id number] Date of Birth: [birth year]  Clinical Social Work is seeking post-discharge placement for this patient at the [place] level of care (*CSW will initial, date and re-position this form in  chart as items are completed):  Yes   Patient/family provided with [facility] [place] Work Department's list of facilities offering this level of care within the geographic area requested by the patient (or if unable, by the patient's family).  Yes   Patient/family informed of their freedom to choose among providers that offer the needed level of care, that participate in Medicare, Medicaid or managed care program needed by the patient, have an available bed and are willing to accept the patient.  Yes   Patient/family informed of [facility]'s ownership interest in [facility] and [facility], as well as of the fact that they are under no obligation to receive care at these facilities.  PASRR submitted to EDS on 03/17/15     PASRR number received on 03/17/15     Existing PASRR number confirmed on       FL2 transmitted to all facilities in geographic area requested by pt/family on 03/18/15     FL2 transmitted to all facilities within larger geographic area on       Patient informed that his/her managed care company has contracts with or will negotiate with certain facilities, including the following:        Yes   Patient/family informed of bed offers received.  Patient chooses bed at [facility]     Physician recommends and patient chooses bed at      Patient to be transferred to [facility] on 03/19/15.  Patient to be transferred to facility by  Corey Harold )     Patient family notified on 03/19/15 of transfer.  Name of family member notified:   (Pt's wife )     PHYSICIAN Please sign FL2, Please prepare priority  discharge summary, including medications, Please prepare prescriptions     Additional Comment:    _______________________________________________ Rozell Searing, LCSW 03/19/2015, 10:52 AM

## 2015-03-19 NOTE — Care Management Note (Signed)
Case Management Note  Patient Details  Name: Arthur Wood MRN: [id number] Date of Birth: 1931/10/22  Subjective/Objective:  Pt admitted for AMS. Plan is for SNF today.                   Action/Plan: CSW assistig with disposition needs.    Expected Discharge Date:                  Expected Discharge Plan:  Skilled Nursing Facility  In-House Referral:  Clinical Social Work  Discharge planning Services  CM Consult  Post Acute Care Choice:  NA Choice offered to:  NA  DME Arranged:  N/A DME Agency:  NA  HH Arranged:  NA HH Agency:  NA  Status of Service:  Completed, signed off  Medicare Important Message Given:  Yes Date Medicare IM Given:    Medicare IM give by:    Date Additional Medicare IM Given:    Additional Medicare Important Message give by:     If discussed at [place] of Stay Meetings, dates discussed:    Additional Comments:  Bethena Roys, RN 03/19/2015, 12:06 PM

## 2015-03-19 NOTE — Discharge Summary (Addendum)
Physician Discharge Summary   Patient ID: Coehn Banner MRN: [id number] DOB/AGE: 80-Jan-1933 80 y.o.  Admit date: 03/15/2015 Discharge date: 03/19/2015  Primary Care Physician:  Kandice Hams, MD  Discharge Diagnoses:   Acute encephalopathy TIA versus seizure Left sphenoid wing meningioma Atrial fibrillation not on anticoagulants Hypertension Asthma  Consults:  Neurosurgery, Dr. Trenton Gammon Neurology, Dr. Silverio Decamp   Recommendations for Outpatient Follow-up:  1. PT recommended skilled nursing facility appointment  2. Appointment scheduled with Dr. Delice Lesch for seizures 3. Patient started on Keppra, follow LFTs periodically 4. Please schedule follow-up appointment with Dr. Trenton Gammon regarding meningioma    DIET: heart healthy soft     Allergies:  No Known Allergies   DISCHARGE MEDICATIONS: Current Discharge Medication List    START taking these medications   Details  levETIRAcetam (KEPPRA) 500 MG tablet Take 1 tablet (500 mg total) by mouth 2 (two) times daily. Qty: 60 tablet, Refills: 1      CONTINUE these medications which have CHANGED   Details  cloNIDine (CATAPRES) 0.1 MG tablet Take 1 tablet (0.1 mg total) by mouth daily. Qty: 60 tablet    NIFEdipine (PROCARDIA XL/ADALAT-CC) 60 MG 24 hr tablet Take 1 tablet (60 mg total) by mouth daily.    PHENObarbital (LUMINAL) 30 MG tablet Take 3 tablets (90 mg total) by mouth every evening. Qty: 90 tablet, Refills: 0      CONTINUE these medications which have NOT CHANGED   Details  albuterol (PROVENTIL HFA;VENTOLIN HFA) 108 (90 Base) MCG/ACT inhaler Inhale 1-2 puffs into the lungs every 4 (four) hours as needed for wheezing or shortness of breath.    beclomethasone (QVAR) 80 MCG/ACT inhaler Inhale 1 puff into the lungs 2 (two) times daily.    clopidogrel (PLAVIX) 75 MG tablet Take 75 mg by mouth daily.      furosemide (LASIX) 20 MG tablet Take 20 mg by mouth daily.      lisinopril (PRINIVIL,ZESTRIL) 40 MG tablet Take 40  mg by mouth daily.      Multiple Vitamins-Minerals (MULTIVITAMIN ADULT) CHEW Chew 2 each by mouth daily.    rosuvastatin (CRESTOR) 5 MG tablet Take 5 mg by mouth every evening.     salmeterol (SEREVENT DISKUS) 50 MCG/DOSE diskus inhaler Inhale 1 puff into the lungs daily.      STOP taking these medications     potassium chloride SA (K-DUR,KLOR-CON) 20 MEQ tablet      potassium chloride (KLOR-CON) 20 MEQ packet          Brief H and P: For complete details please refer to admission H and P, but in brief Arthur Wood is a 82 y.o. 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 Around 10 pm, accompanied by progressive lethargy. Per wife report, last seen interactive around 1 am.No nausea or vomiting was noted. No dizziness. No vision changes. No seizures. No apparent cardiac, respiratory complaints or diaphoresis. No sick contacts. No recent travels. Had urine incontinence. Symptoms are beginning to clear, returning to his baseline.  Hospital Course:   Acute encephalopathy possibly due to TIA versus seizure - CT scan with showed vasogenic type edema in the left temporal lobe primarily as with mass vs infection such as herpes encephalitis. He was started on antivirals based on CT scan. Patient was without fevers, headaches, no neck pain, meningismus. No photophobia, no nausea. MRI showed  4.2 cm left sphenoid wing meningioma with cavernous sinus invasion, mild left optic nerve mass effect, and mild-to-moderate brain edema. Less like infection given MRI findings. - Neurosurgery was consulted, Dr. Annette Stable evaluated patient, no plans of surgery as a sphenoid wing meningioma resection would be high morbidity given his tenuous medical status.  - 2-D echo showed EF of 55-60% with grade 1 diastolic dysfunction - carotids  without significant ICA stenosis - TSH normal, B12 normal, Ammonia normal -EEG showed underlying neuronal dysfunction with epileptogenic potential in the left frontoparietal region, discussed and consulted neurology, Dr. Silverio Decamp. Patient was started on Keppra by neurology which patient tolerated well. Outpatient neurology appointment for seizures scheduled with Dr Delice Lesch on 03/27/15.    Atrial Fibrillation not on anticoagulants due to history of brain hemorrhage while on Coumadin in 1998 -2D Echo showed EF of 55-60% with grade 1 diastolic dysfunction -Continue home meds   Hypertension emergency. BP 226/123 at the time of admission - Currently improved, 146/82 at discharge, continue clonidine, nifedipine, lisinopril, Lasix  Asthma, likely COPD with regular episodes of bronchitis - stable, sats normal on RA, no acute changes.   History of L RCC, not on treatment - CT abdomen without new suspicious masses   Day of Discharge BP 146/82 mmHg  Pulse 68  Temp(Src) 97.9 F (36.6 C) (Oral)  Resp 18  Ht 5\' 9"  (1.753 m)  Wt 102.604 kg (226 lb 3.2 oz)  BMI 33.39 kg/m2  SpO2 94%  Physical Exam: General: Alert and awake oriented x3 not in any acute distress. HEENT: anicteric sclera, pupils reactive to light and accommodation CVS: S1-S2 clear no murmur rubs or gallops Chest: clear to auscultation bilaterally, no wheezing rales or rhonchi Abdomen: soft nontender, nondistended, normal bowel sounds Extremities: no cyanosis, clubbing or edema noted bilaterally Neuro: mild 4/5 right upper and lower extremity weakness, left 5/5   The results of significant diagnostics from this hospitalization (including imaging, microbiology, ancillary and laboratory) are listed below for reference.    LAB RESULTS: Basic Metabolic Panel:  Recent Labs Lab 03/16/15 0406 03/19/15 0530  NA 142 143  K 4.0 4.1  CL 105 106  CO2 29 29  GLUCOSE 124* 101*  BUN 15 17  CREATININE 0.99 1.10  CALCIUM 8.9 8.9    Liver Function Tests:  Recent Labs Lab 03/15/15 0923 03/19/15 0530  AST 19 20  ALT 15* 18  ALKPHOS 81 82  BILITOT 0.7 0.4  PROT 6.2* 6.5  ALBUMIN 3.7 3.5   No results for input(s): LIPASE, AMYLASE in the last 168 hours.  Recent Labs Lab 03/15/15 0451  AMMONIA 26   CBC:  Recent Labs Lab 03/15/15 0434  03/16/15 0406 03/19/15 0530  WBC 5.9  < > 6.8 5.9  NEUTROABS 3.0  --   --   --   HGB 13.7  < > 13.3 13.3  HCT 41.9  < > 41.3 40.5  MCV 91.9  < > 92.0 92.7  PLT 166  < > 188 171  < > = values in this interval not displayed. Cardiac Enzymes:  Recent Labs Lab 03/15/15 0434  TROPONINI <0.03   BNP: Invalid input(s): POCBNP CBG: No results for input(s): GLUCAP in the last 168 hours.  Significant Diagnostic Studies:  Ct Abdomen Pelvis Wo Contrast  03/15/2015  CLINICAL DATA:  Abdominal pain, evaluate for inguinal hernia EXAM: CT ABDOMEN AND PELVIS WITHOUT CONTRAST TECHNIQUE: Multidetector CT imaging of the abdomen and pelvis was performed following the standard protocol without IV contrast. COMPARISON:  04/08/2014 FINDINGS: Lower chest and abdominal wall: Massive right inguinal hernia containing small and large bowel which is nonobstructed. Small fatty umbilical hernia. Mild bronchial wall thickening and scarring in the right lower lobe. Hepatobiliary: No focal liver abnormality. High-density material in the gallbladder with layering calculi. Front or nodular low-density along the anterior gallbladder wall is likely cholesterolosis or floating fat and has been seen since at least 2015 CT. Pancreas: Unremarkable. Spleen: Unremarkable. Adrenals/Urinary Tract: Negative adrenals. Stable appearance of left renal mass ablation site. Small hemorrhagic cyst in the lower pole right kidney, stable. Simple density exophytic left renal cyst is also stable. Unremarkable bladder. Reproductive:Mild prostate enlargement. Stomach/Bowel: No obstruction. No appendicitis. No inflammatory bowel  wall thickening. Extensive colonic diverticulosis. Vascular/Lymphatic: Diffuse atherosclerosis. No evidence of acute vascular disease. No mass or adenopathy. Peritoneal: No ascites or pneumoperitoneum. Musculoskeletal: No acute abnormalities. IMPRESSION: 1. No acute finding. 2. Massive right inguinal hernia containing nonobstructed bowel. 3. Cholelithiasis and other stable chronic findings are described above. Electronically Signed   By: Monte Fantasia M.D.   On: 03/15/2015 05:36   Ct Head Wo Contrast  03/15/2015  CLINICAL DATA:  Altered mental status with progressive lethargy. EXAM: CT HEAD WITHOUT CONTRAST TECHNIQUE: Contiguous axial images were obtained from the base of the skull through the vertex without intravenous contrast. COMPARISON:  None. FINDINGS: Skull and Sinuses:Remote biparietal craniotomy. Mucosal thickening focally and the upper left frontal sinus, without sinus expansion. Visualized orbits: Negative. Brain: The white matter of the left temporal lobe is low-density with sulcal effacement. Appearance suggests vasogenic edema. Appearing nodular thickening at the anterior pole of the left temporal lobe could be normal cortex or mass. There is contiguous low-density in the left putamen and deep white matter tracts. 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 Well-defined, remote appearing low posterior left frontal cortical and subcortical infarct. No hydrocephalus or acute hemorrhage. There are CSF density subdural collections around the bilateral cerebral convexities at the vertex, measuring up to 13 mm in the right frontal region with mild cortical mass effect. These results were called by telephone at the time of interpretation on 03/15/2015 at 5:24 am to Dr. Ezequiel Essex , who verbally acknowledged these results. IMPRESSION: 1. Vasogenic type edema in the left temporal lobe primarily  as with mass or infection. Given location and history, suggest empiric treatment for herpes encephalitis during workup. Recommend MRI with contrast. 2. Age-indeterminate left basal ganglia infarct. 3. Remote left frontal infarct. Cystic encephalomalacia in the right temporal lobe. 4. Bilateral subdural hygroma or remote hematoma with mild mass effect in the right frontal region. Electronically Signed   By: Monte Fantasia M.D.   On: 03/15/2015 05:30   Mr Jeri Cos [id number] Contrast  03/15/2015  CLINICAL DATA:  Altered mental status. Seizure. Left temporal lobe edema on CT. History of left renal cell cancer status post ablation. EXAM: MRI HEAD WITHOUT AND WITH CONTRAST TECHNIQUE: Multiplanar, multiecho pulse sequences of the brain and surrounding structures were obtained without and with intravenous contrast. CONTRAST:  86mL MULTIHANCE GADOBENATE DIMEGLUMINE 529 MG/ML IV SOLN COMPARISON:  Head CT 03/15/2015 FINDINGS: No definite acute infarct is identified. There is a small focus of cortical/ subcortical trace diffusion signal abnormality in the high posterior left frontal lobe without evidence of significantly reduced ADC, likely a small chronic infarct. There is encephalomalacia in the medial left parietal lobe with prominent chronic hemosiderin deposition and ex vacuo dilatation of  the left lateral ventricle. The there is also hemosiderin staining involving several right parietal sulci compatible with remote subarachnoid hemorrhage. Numerous chronic parenchymal microhemorrhages are present in the cerebellum, brainstem, thalami, and scattered throughout both cerebral hemispheres. Chronic blood products are also noted associated with a chronic left frontal opercular infarct as well as with a large area of cystic encephalomalacia involving the right temporal lobe. Asymmetric CSF is noted over both frontal convexities and along the left interhemispheric fissure, measuring up to 15 mm in thickness anterior to the right  frontal lobe with mild cortical mass effect and overall suggestive of subdural hygromas. There is a homogeneously enhancing extra-axial mass with broad dural base arising from the region of the left anterior clinoid process and sphenoid wing. The mass measures approximately 4.2 x 2.3 x 3.1 cm (oblique AP by transverse by craniocaudal). The mass extends anterolaterally along the sphenoid wing in the middle cranial fossa. There is involvement of the left cavernous sinus. The mass contacts and slightly displaces the pre chiasmatic left optic nerve without frank compression or encasement. There is mild-to-moderate vasogenic edema in the left temporal and inferior left frontal lobe with involvement of the adjacent deep white matter tracts. There is a round 11 mm homogeneously enhancing extra-axial mass associated with the dura just below the torcula without associated cerebellar edema or mass effect. No evidence of sinus invasion. Prominent dilated perivascular spaces are present throughout the basal ganglia bilaterally. Chronic thalamic lacunar infarcts are noted. Sequelae of prior bilateral parietal craniotomies are identified. There is a mildly to moderately expanded, partially empty sella. Prior bilateral cataract extraction is noted. There is a small right maxillary sinus mucous retention cyst. The mastoid air cells are clear. Major intracranial vascular flow voids are preserved. IMPRESSION: 1. 4.2 cm left sphenoid wing meningioma with cavernous sinus invasion, mild left optic nerve mass effect, and mild-to-moderate brain edema. 2. 11 mm posterior fossa meningioma. 3. Areas of chronic encephalomalacia, chronic hemorrhages, and subdural hygromas as above. Electronically Signed   By: Logan Bores M.D.   On: 03/15/2015 09:54   Dg Chest Portable 1 View  03/15/2015  CLINICAL DATA:  Altered mental status EXAM: PORTABLE CHEST 1 VIEW COMPARISON:  01/11/2010 FINDINGS: Borderline cardiomegaly is stable, accentuated by low  volumes. Negative aortic and hilar contours. Right paratracheal fullness and convexity has a stable appearance since 2012 chest x-ray and thus is likely ectatic vessels. Chronic elevation the left diaphragm. There is no edema, consolidation, effusion, or pneumothorax. IMPRESSION: No acute finding.  Stable compared to 2012. Electronically Signed   By: Monte Fantasia M.D.   On: 03/15/2015 04:51    2D ECHO:  Study Conclusions  - Left ventricle: The cavity size was normal. There was severe  concentric hypertrophy. The appearance was consistent with  hypertrophic cardiomyopathy. Systolic function was normal. The  estimated ejection fraction was in the range of 55% to 60%. Wall  motion was normal; there were no regional wall motion  abnormalities. Doppler parameters are consistent with abnormal  left ventricular relaxation (grade 1 diastolic dysfunction). - Aortic valve: Trileaflet; mildly thickened, mildly calcified  leaflets. - Aorta: Aortic root dimension: 41 mm (ED). - Ascending aorta: The ascending aorta was mildly dilated. - Mitral valve: Calcified annulus. - Left atrium: The atrium was moderately dilated.  Disposition and Follow-up: Discharge Instructions    Diet - low sodium heart healthy    Complete by:  As directed      Increase activity slowly    Complete by:  As directed             DISPOSITION: Skilled nursing facility   DISCHARGE FOLLOW-UP Follow-up Information    Follow up with POLITE,RONALD D, MD. Schedule an appointment as soon as possible for a visit in 2 weeks.   Specialty:  Internal Medicine   Why:  for hospital follow-up   Contact information:   [address] [place] [address] [phone]       Follow up with Earnie Larsson A, MD. Schedule an appointment as soon as possible for a visit in 2 weeks.   Specialty:  Neurosurgery   Why:  for hospital follow-up   Contact information:   [address] [place] [address] [place]  [address] [phone]       Follow up with Cameron Sprang, MD On 03/27/2015.   Specialty:  Neurology   Why:  at 10:15AM , for hospital follow-up for seizures   Contact information:   [address] [place] [address] [phone]        Time spent on Discharge: 40 minutes  Signed:   Muzamil Harker M.D. [facility] 03/19/2015, 11:26 AM Pager: [phone]    Coding query Patient's acute encephalopathy was likely due to seizure. However there was a concern for TIA.   Akaisha Truman M.D. [facility] 03/21/2015, 1:20 PM  Pager: [phone]  Coding query Seizure, likely breakthrough with history of epilepsy.   Elianna Windom M.D. [facility] 03/23/2015, 4:55 PM  Pager: [phone]

## 2015-03-19 NOTE — Clinical Social Work Note (Signed)
Clinical Social Worker facilitated patient discharge including contacting patient family and facility to confirm patient discharge plans.  Clinical information faxed to facility and family agreeable with plan.  CSW arranged ambulance transport via [facility] to [facility] and [facility]  RN to call report prior to discharge.  Clinical Social Worker will sign off for now as social work intervention is no longer needed. Please consult [place] again if new need arises.  Glendon Axe, MSW, LCSWA [phone] 03/19/2015 11:59 AM

## 2015-03-20 ENCOUNTER — Non-Acute Institutional Stay (SKILLED_NURSING_FACILITY): Payer: Medicare Other | Admitting: Internal Medicine

## 2015-03-20 ENCOUNTER — Encounter: Payer: Self-pay | Admitting: Internal Medicine

## 2015-03-20 DIAGNOSIS — G9340 Encephalopathy, unspecified: Secondary | ICD-10-CM | POA: Diagnosis not present

## 2015-03-20 DIAGNOSIS — I4891 Unspecified atrial fibrillation: Secondary | ICD-10-CM | POA: Diagnosis not present

## 2015-03-20 DIAGNOSIS — R569 Unspecified convulsions: Secondary | ICD-10-CM

## 2015-03-20 DIAGNOSIS — E785 Hyperlipidemia, unspecified: Secondary | ICD-10-CM

## 2015-03-20 DIAGNOSIS — J4540 Moderate persistent asthma, uncomplicated: Secondary | ICD-10-CM | POA: Diagnosis not present

## 2015-03-20 DIAGNOSIS — I10 Essential (primary) hypertension: Secondary | ICD-10-CM | POA: Diagnosis not present

## 2015-03-20 DIAGNOSIS — I161 Hypertensive emergency: Secondary | ICD-10-CM

## 2015-03-20 LAB — CULTURE, BLOOD (ROUTINE X 2)
CULTURE: NO GROWTH
CULTURE: NO GROWTH

## 2015-03-20 NOTE — Progress Notes (Signed)
Patient ID: Barre Acors, male   DOB: 26-Sep-1931, 82 y.o.   MRN: [id number]: [id number] Name: Natanel Angotti  Sex: male Age: 82 y.o. DOB: 04-10-1931  [place] #: [facility] Facility/Room:107 Level Of Care: SNF Provider: Hennie Duos Emergency Contacts: Extended Emergency Contact Information Primary Emergency Contact: [facility] Address: [address] [place], [place] [address] Johnnette Litter of [place] Phone: [phone] Work Phone: [phone] Relation: Spouse  Code Status:   Allergies: Review of patient's allergies indicates no known allergies.   HPI: Patient is 82 y.o. 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 Around 10 pm, accompanied by progressive lethargy. Per wife report, last seen interactive around 1 am.No nausea or vomiting was noted. No dizziness. No vision changes. No seizures. No apparent cardiac, respiratory complaints or diaphoresis. No sick contacts. No recent travels. Had urine incontinence. Symptoms are beginning to clear, returning to his baseline. Pt was admitted to [facility] from 3/12-16 where he was dx with hypertensive emergency and encephalopathy which is probably seizures. Pt is admitted to [facility] 3/16 for generalized weakness for OT/PT. While at SNF pt will be followed for AF, tx with no anticoags  2/2 prior brain hemorrhage, asrhma, tx with inhalers and HLD, tx with crestor.   Past Medical History  Diagnosis Date  . Atrial fibrillation ([place])   . Asthma   . Hypertension   . lt renal ca dx'd 03/2010  . Seizures ([place])   . Stroke [facility])     History reviewed. No pertinent past surgical history.    Medication List       This list is accurate as of: 03/20/15 11:59 PM.  Always use your most recent med list.               albuterol 108 (90 Base) MCG/ACT  inhaler  Commonly known as:  PROVENTIL HFA;VENTOLIN HFA  Inhale 1-2 puffs into the lungs every 4 (four) hours as needed for wheezing or shortness of breath.     cloNIDine 0.1 MG tablet  Commonly known as:  CATAPRES  Take 1 tablet (0.1 mg total) by mouth daily.     clopidogrel 75 MG tablet  Commonly known as:  PLAVIX  Take 75 mg by mouth daily.     furosemide 20 MG tablet  Commonly known as:  LASIX  Take 20 mg by mouth daily.     levETIRAcetam 500 MG tablet  Commonly known as:  KEPPRA  Take 1 tablet (500 mg total) by mouth 2 (two) times daily.     lisinopril 40 MG tablet  Commonly known as:  PRINIVIL,ZESTRIL  Take 40 mg by mouth daily.     MULTIVITAMIN ADULT Chew  Chew 2 each by mouth daily.     NIFEdipine 60 MG 24 hr tablet  Commonly known as:  PROCARDIA XL/ADALAT-CC  Take 1 tablet (60 mg total) by mouth daily.     PHENObarbital 30 MG tablet  Commonly known as:  LUMINAL  Take 3 tablets (90 mg total) by mouth every evening.     QVAR 80 MCG/ACT inhaler  Generic drug:  beclomethasone  Inhale 1 puff into the lungs 2 (two) times daily.     rosuvastatin 5 MG tablet  Commonly known as:  CRESTOR  Take 5 mg by mouth every evening.  SEREVENT DISKUS 50 MCG/DOSE diskus inhaler  Generic drug:  salmeterol  Inhale 1 puff into the lungs daily.        No orders of the defined types were placed in this encounter.    Immunization History  Administered Date(s) Administered  . PPD Test 03/19/2015    Social History  Substance Use Topics  . Smoking status: Former Smoker    Types: Cigarettes    Quit date: 02/11/1983  . Smokeless tobacco: Not on file  . Alcohol Use: No    Family history is + stroke   Review of Systems  DATA OBTAINED: from patient, nurse GENERAL:  no fevers, fatigue, appetite changes SKIN: No itching, rash or wounds EYES: No eye pain, redness, discharge EARS: No earache, tinnitus, change in hearing NOSE: No congestion, drainage or bleeding   MOUTH/THROAT: No mouth or tooth pain, No sore throat RESPIRATORY: No cough, wheezing, SOB CARDIAC: No chest pain, palpitations, lower extremity edema  GI: No abdominal pain, No N/V/D or constipation, No heartburn or reflux  GU: No dysuria, frequency or urgency, or incontinence  MUSCULOSKELETAL: No unrelieved bone/joint pain NEUROLOGIC: No headache, dizziness or focal weakness PSYCHIATRIC: No c/o anxiety or sadness   Filed Vitals:   03/20/15 0829  BP: 146/82  Pulse: 68  Temp: 97.9 F (36.6 C)  Resp: 18    SpO2 Readings from Last 1 Encounters:  03/20/15 94%        Physical Exam  GENERAL APPEARANCE: Alert, conversant,  No acute distress.  SKIN: No diaphoresis rash HEAD: Normocephalic, atraumatic  EYES: Conjunctiva/lids clear. Pupils round, reactive. EOMs intact.  EARS: External exam WNL, canals clear. Hearing grossly normal.  NOSE: No deformity or discharge.  MOUTH/THROAT: Lips w/o lesions  RESPIRATORY: Breathing is even, unlabored. Lung sounds are clear   CARDIOVASCULAR: Heart RRR no murmurs, rubs or gallops. No peripheral edema.   GASTROINTESTINAL: Abdomen is soft, non-tender, not distended w/ normal bowel sounds. GENITOURINARY: Bladder non tender, not distended  MUSCULOSKELETAL: No abnormal joints or musculature NEUROLOGIC:  Cranial nerves 2-12 grossly intact. Moves all extremities  PSYCHIATRIC: Mood and affect appropriate to situation, no behavioral issues  Patient Active Problem List   Diagnosis Date Noted  . Seizures ([place]) 03/28/2015  . Hypertensive emergency 03/28/2015  . Hyperlipidemia 03/28/2015  . Altered mental status 03/15/2015  . Atrial fibrillation ([place]) 03/15/2015  . Hypertension 03/15/2015  . Asthma 03/15/2015  . Acute encephalopathy 03/15/2015  . Renal mass, left     CBC    Component Value Date/Time   WBC 5.9 03/19/2015 0530   WBC 5.9 03/15/2015   RBC 4.37 03/19/2015 0530   HGB 13.3 03/19/2015 0530   HCT 40.5 03/19/2015 0530   PLT 171  03/19/2015 0530   MCV 92.7 03/19/2015 0530   LYMPHSABS 2.0 03/15/2015 0434   MONOABS 0.6 03/15/2015 0434   EOSABS 0.3 03/15/2015 0434   BASOSABS 0.0 03/15/2015 0434    CMP     Component Value Date/Time   NA 143 03/19/2015 0530   NA 143 03/15/2015   K 4.1 03/19/2015 0530   CL 106 03/19/2015 0530   CO2 29 03/19/2015 0530   GLUCOSE 101* 03/19/2015 0530   BUN 17 03/19/2015 0530   BUN 16 03/15/2015   CREATININE 1.10 03/19/2015 0530   CREATININE 0.9 03/15/2015   CREATININE 0.87 03/31/2014 1805   CALCIUM 8.9 03/19/2015 0530   PROT 6.5 03/19/2015 0530   ALBUMIN 3.5 03/19/2015 0530   AST 20 03/19/2015 0530   ALT 18 03/19/2015  0530   ALKPHOS 82 03/19/2015 0530   BILITOT 0.4 03/19/2015 0530   GFRNONAA >60 03/19/2015 0530   GFRNONAA 82 03/31/2014 1805   GFRAA >60 03/19/2015 0530   GFRAA >89 03/31/2014 1805    Lab Results  Component Value Date   HGBA1C * 01/11/2010    5.9 (NOTE)                                                                       According to the ADA Clinical Practice Recommendations for 2011, when HbA1c is used as a screening test:   >=6.5%   Diagnostic of Diabetes Mellitus           (if abnormal result  is confirmed)  5.7-6.4%   Increased risk of developing Diabetes Mellitus  References:Diagnosis and Classification of Diabetes Mellitus,Diabetes [id number] 1):S62-S69 and Standards of Medical Care in         Diabetes - 2011,Diabetes Care,2011,34  (Suppl 1):S11-S61.     Ct Abdomen Pelvis Wo Contrast  03/15/2015  CLINICAL DATA:  Abdominal pain, evaluate for inguinal hernia EXAM: CT ABDOMEN AND PELVIS WITHOUT CONTRAST TECHNIQUE: Multidetector CT imaging of the abdomen and pelvis was performed following the standard protocol without IV contrast. COMPARISON:  04/08/2014 FINDINGS: Lower chest and abdominal wall: Massive right inguinal hernia containing small and large bowel which is nonobstructed. Small fatty umbilical hernia. Mild bronchial wall thickening and  scarring in the right lower lobe. Hepatobiliary: No focal liver abnormality. High-density material in the gallbladder with layering calculi. Front or nodular low-density along the anterior gallbladder wall is likely cholesterolosis or floating fat and has been seen since at least 2015 CT. Pancreas: Unremarkable. Spleen: Unremarkable. Adrenals/Urinary Tract: Negative adrenals. Stable appearance of left renal mass ablation site. Small hemorrhagic cyst in the lower pole right kidney, stable. Simple density exophytic left renal cyst is also stable. Unremarkable bladder. Reproductive:Mild prostate enlargement. Stomach/Bowel: No obstruction. No appendicitis. No inflammatory bowel wall thickening. Extensive colonic diverticulosis. Vascular/Lymphatic: Diffuse atherosclerosis. No evidence of acute vascular disease. No mass or adenopathy. Peritoneal: No ascites or pneumoperitoneum. Musculoskeletal: No acute abnormalities. IMPRESSION: 1. No acute finding. 2. Massive right inguinal hernia containing nonobstructed bowel. 3. Cholelithiasis and other stable chronic findings are described above. Electronically Signed   By: Monte Fantasia M.D.   On: 03/15/2015 05:36   Ct Head Wo Contrast  03/15/2015  CLINICAL DATA:  Altered mental status with progressive lethargy. EXAM: CT HEAD WITHOUT CONTRAST TECHNIQUE: Contiguous axial images were obtained from the base of the skull through the vertex without intravenous contrast. COMPARISON:  None. FINDINGS: Skull and Sinuses:Remote biparietal craniotomy. Mucosal thickening focally and the upper left frontal sinus, without sinus expansion. Visualized orbits: Negative. Brain: The white matter of the left temporal lobe is low-density with sulcal effacement. Appearance suggests vasogenic edema. Appearing nodular thickening at the anterior pole of the left temporal lobe could be normal cortex or mass. There is contiguous low-density in the left putamen and deep white matter tracts. Cystic type  encephalomalacia in the right temporal lobe which is contiguous with the temporal horn of the lateral ventricle, possible porencephalic cyst. Well-defined, remote appearing low posterior left frontal cortical and subcortical infarct. No hydrocephalus or acute hemorrhage. There are CSF density subdural collections around  the bilateral cerebral convexities at the vertex, measuring up to 13 mm in the right frontal region with mild cortical mass effect. These results were called by telephone at the time of interpretation on 03/15/2015 at 5:24 am to Dr. Ezequiel Essex , who verbally acknowledged these results. IMPRESSION: 1. Vasogenic type edema in the left temporal lobe primarily as with mass or infection. Given location and history, suggest empiric treatment for herpes encephalitis during workup. Recommend MRI with contrast. 2. Age-indeterminate left basal ganglia infarct. 3. Remote left frontal infarct. Cystic encephalomalacia in the right temporal lobe. 4. Bilateral subdural hygroma or remote hematoma with mild mass effect in the right frontal region. Electronically Signed   By: Monte Fantasia M.D.   On: 03/15/2015 05:30   Mr Jeri Cos [id number] Contrast  03/15/2015  CLINICAL DATA:  Altered mental status. Seizure. Left temporal lobe edema on CT. History of left renal cell cancer status post ablation. EXAM: MRI HEAD WITHOUT AND WITH CONTRAST TECHNIQUE: Multiplanar, multiecho pulse sequences of the brain and surrounding structures were obtained without and with intravenous contrast. CONTRAST:  53mL MULTIHANCE GADOBENATE DIMEGLUMINE 529 MG/ML IV SOLN COMPARISON:  Head CT 03/15/2015 FINDINGS: No definite acute infarct is identified. There is a small focus of cortical/ subcortical trace diffusion signal abnormality in the high posterior left frontal lobe without evidence of significantly reduced ADC, likely a small chronic infarct. There is encephalomalacia in the medial left parietal lobe with prominent chronic hemosiderin  deposition and ex vacuo dilatation of the left lateral ventricle. The there is also hemosiderin staining involving several right parietal sulci compatible with remote subarachnoid hemorrhage. Numerous chronic parenchymal microhemorrhages are present in the cerebellum, brainstem, thalami, and scattered throughout both cerebral hemispheres. Chronic blood products are also noted associated with a chronic left frontal opercular infarct as well as with a large area of cystic encephalomalacia involving the right temporal lobe. Asymmetric CSF is noted over both frontal convexities and along the left interhemispheric fissure, measuring up to 15 mm in thickness anterior to the right frontal lobe with mild cortical mass effect and overall suggestive of subdural hygromas. There is a homogeneously enhancing extra-axial mass with broad dural base arising from the region of the left anterior clinoid process and sphenoid wing. The mass measures approximately 4.2 x 2.3 x 3.1 cm (oblique AP by transverse by craniocaudal). The mass extends anterolaterally along the sphenoid wing in the middle cranial fossa. There is involvement of the left cavernous sinus. The mass contacts and slightly displaces the pre chiasmatic left optic nerve without frank compression or encasement. There is mild-to-moderate vasogenic edema in the left temporal and inferior left frontal lobe with involvement of the adjacent deep white matter tracts. There is a round 11 mm homogeneously enhancing extra-axial mass associated with the dura just below the torcula without associated cerebellar edema or mass effect. No evidence of sinus invasion. Prominent dilated perivascular spaces are present throughout the basal ganglia bilaterally. Chronic thalamic lacunar infarcts are noted. Sequelae of prior bilateral parietal craniotomies are identified. There is a mildly to moderately expanded, partially empty sella. Prior bilateral cataract extraction is noted. There is a  small right maxillary sinus mucous retention cyst. The mastoid air cells are clear. Major intracranial vascular flow voids are preserved. IMPRESSION: 1. 4.2 cm left sphenoid wing meningioma with cavernous sinus invasion, mild left optic nerve mass effect, and mild-to-moderate brain edema. 2. 11 mm posterior fossa meningioma. 3. Areas of chronic encephalomalacia, chronic hemorrhages, and subdural hygromas as above. Electronically Signed  By: Logan Bores M.D.   On: 03/15/2015 09:54   Dg Chest Portable 1 View  03/15/2015  CLINICAL DATA:  Altered mental status EXAM: PORTABLE CHEST 1 VIEW COMPARISON:  01/11/2010 FINDINGS: Borderline cardiomegaly is stable, accentuated by low volumes. Negative aortic and hilar contours. Right paratracheal fullness and convexity has a stable appearance since 2012 chest x-ray and thus is likely ectatic vessels. Chronic elevation the left diaphragm. There is no edema, consolidation, effusion, or pneumothorax. IMPRESSION: No acute finding.  Stable compared to 2012. Electronically Signed   By: Monte Fantasia M.D.   On: 03/15/2015 04:51    Not all labs, radiology exams or other studies done during hospitalization come through on my EPIC note; however they are reviewed by me.    Assessment and Plan  Acute encephalopathy possibly due to TIA versus seizure - CT scan with showed vasogenic type edema in the left temporal lobe primarily as with mass vs infection such as herpes encephalitis. He was started on antivirals based on CT scan. Patient was without fevers, headaches, no neck pain, meningismus. No photophobia, no nausea. MRI showed 4.2 cm left sphenoid wing meningioma with cavernous sinus invasion, mild left optic nerve mass effect, and mild-to-moderate brain edema. Less like infection given MRI findings. - Neurosurgery was consulted, Dr. Annette Stable evaluated patient, no plans of surgery as a sphenoid wing meningioma resection would be high morbidity given his tenuous medical  status.  - 2-D echo showed EF of 55-60% with grade 1 diastolic dysfunction - carotids without significant ICA stenosis - TSH normal, B12 normal, Ammonia normal -EEG showed underlying neuronal dysfunction with epileptogenic potential in the left frontoparietal region, discussed and consulted neurology, Dr. Silverio Decamp. Patient was started on Keppra by neurology which patient tolerated well. Outpatient neurology appointment for seizures scheduled with Dr Delice Lesch on 03/27/15.    Seizures ([place]) SNF -possible/probABLE, PT STARTED ON KEPPRA 500 MG biD AND PHENOBARBITAL 90MG  QHS  Atrial fibrillation ([facility]) not on anticoagulants due to history of brain hemorrhage while on Coumadin in 1998 -2D Echo showed EF of 55-60% with grade 1 diastolic dysfunction SNF - will monitor  Hypertensive emergency Hypertension emergency. BP 226/123 at the time of admission - Currently improved, 146/82 at discharge, continue clonidine, nifedipine, lisinopril, lasix  Hypertension SNF - reasonably controlled on procardia 60 mg daily. Lisinopril 40 mg daily, clinidine 0.1 mg daily , lasix 20 mg daily  Asthma SNF - stable; cont LABA and qvar daily with prn albuterol  Hyperlipidemia SNF -  Not stated as uncontrolled; cont crestor   Time spent . 45 min;> 50% of time with patient was spent reviewing records, labs, tests and studies, counseling and developing plan of care  Inocencio Homes, MD

## 2015-03-27 ENCOUNTER — Ambulatory Visit: Payer: BC Managed Care – PPO | Admitting: Neurology

## 2015-03-28 ENCOUNTER — Encounter: Payer: Self-pay | Admitting: Internal Medicine

## 2015-03-28 DIAGNOSIS — R569 Unspecified convulsions: Secondary | ICD-10-CM | POA: Insufficient documentation

## 2015-03-28 DIAGNOSIS — E785 Hyperlipidemia, unspecified: Secondary | ICD-10-CM | POA: Insufficient documentation

## 2015-03-28 DIAGNOSIS — I161 Hypertensive emergency: Secondary | ICD-10-CM | POA: Insufficient documentation

## 2015-03-28 NOTE — Assessment & Plan Note (Signed)
SNF -possible/probABLE, PT STARTED ON KEPPRA 500 MG biD AND PHENOBARBITAL 90MG  QHS

## 2015-03-28 NOTE — Assessment & Plan Note (Signed)
SNF -  Not stated as uncontrolled; cont crestor

## 2015-03-28 NOTE — Assessment & Plan Note (Signed)
Hypertension emergency. BP 226/123 at the time of admission - Currently improved, 146/82 at discharge, continue clonidine, nifedipine, lisinopril, lasix

## 2015-03-28 NOTE — Assessment & Plan Note (Signed)
SNF - stable; cont LABA and qvar daily with prn albuterol

## 2015-03-28 NOTE — Assessment & Plan Note (Signed)
possibly due to TIA versus seizure - CT scan with showed vasogenic type edema in the left temporal lobe primarily as with mass vs infection such as herpes encephalitis. He was started on antivirals based on CT scan. Patient was without fevers, headaches, no neck pain, meningismus. No photophobia, no nausea. MRI showed 4.2 cm left sphenoid wing meningioma with cavernous sinus invasion, mild left optic nerve mass effect, and mild-to-moderate brain edema. Less like infection given MRI findings. - Neurosurgery was consulted, Dr. Annette Stable evaluated patient, no plans of surgery as a sphenoid wing meningioma resection would be high morbidity given his tenuous medical status.  - 2-D echo showed EF of 55-60% with grade 1 diastolic dysfunction - carotids without significant ICA stenosis - TSH normal, B12 normal, Ammonia normal -EEG showed underlying neuronal dysfunction with epileptogenic potential in the left frontoparietal region, discussed and consulted neurology, Dr. Silverio Decamp. Patient was started on Keppra by neurology which patient tolerated well. Outpatient neurology appointment for seizures scheduled with Dr Delice Lesch on 03/27/15.

## 2015-03-28 NOTE — Assessment & Plan Note (Signed)
SNF - reasonably controlled on procardia 60 mg daily. Lisinopril 40 mg daily, clinidine 0.1 mg daily , lasix 20 mg daily

## 2015-03-28 NOTE — Assessment & Plan Note (Signed)
not on anticoagulants due to history of brain hemorrhage while on Coumadin in 1998 -2D Echo showed EF of 55-60% with grade 1 diastolic dysfunction SNF - will monitor

## 2015-03-31 ENCOUNTER — Other Ambulatory Visit: Payer: Self-pay | Admitting: *Deleted

## 2015-03-31 MED ORDER — PHENOBARBITAL 60 MG PO TABS: ORAL_TABLET | ORAL | Status: AC

## 2015-03-31 NOTE — Telephone Encounter (Signed)
[facility] 

## 2015-04-08 ENCOUNTER — Encounter: Payer: Self-pay | Admitting: Internal Medicine

## 2015-04-08 ENCOUNTER — Non-Acute Institutional Stay (SKILLED_NURSING_FACILITY): Payer: Medicare Other | Admitting: Internal Medicine

## 2015-04-08 DIAGNOSIS — R569 Unspecified convulsions: Secondary | ICD-10-CM | POA: Diagnosis not present

## 2015-04-08 DIAGNOSIS — J4540 Moderate persistent asthma, uncomplicated: Secondary | ICD-10-CM | POA: Diagnosis not present

## 2015-04-08 DIAGNOSIS — I10 Essential (primary) hypertension: Secondary | ICD-10-CM

## 2015-04-08 DIAGNOSIS — E785 Hyperlipidemia, unspecified: Secondary | ICD-10-CM | POA: Diagnosis not present

## 2015-04-08 DIAGNOSIS — I4891 Unspecified atrial fibrillation: Secondary | ICD-10-CM

## 2015-04-08 DIAGNOSIS — Z8673 Personal history of transient ischemic attack (TIA), and cerebral infarction without residual deficits: Secondary | ICD-10-CM

## 2015-04-08 NOTE — Progress Notes (Signed)
MRN: [id number] Name: Arthur Wood  Sex: male Age: 82 y.o. DOB: [birth year]  [place] #: [facility] Facility/Room:107 Level Of Care: SNF Provider: Inocencio Homes D Emergency Contacts: Extended Emergency Contact Information Primary Emergency Contact: [facility] Address: [address] [place], [place] [address] Johnnette Litter of [place] Phone: [phone] Work Phone: [phone] Relation: Spouse  Code Status:   Allergies: Review of patient's allergies indicates no known allergies.  Chief Complaint  Patient presents with  . Discharge Note    HPI: Patient is 82 y.o. 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  Pt was admitted to [facility] from 3/12-16 where he was dx with hypertensive emergency and encephalopathy which is probably seizures. Pt is admitted to [facility] 3/16 for generalized weakness for OT/PT. Pt is now ready to be d/c to home.  Past Medical History  Diagnosis Date  . Atrial fibrillation ([place])   . Asthma   . Hypertension   . lt renal ca dx'd 03/2010  . Seizures ([place])   . Stroke [facility])     History reviewed. No pertinent past surgical history.    Medication List       This list is accurate as of: 04/08/15  5:10 PM.  Always use your most recent med list.               albuterol 108 (90 Base) MCG/ACT inhaler  Commonly known as:  PROVENTIL HFA;VENTOLIN HFA  Inhale 1-2 puffs into the lungs every 4 (four) hours as needed for wheezing or shortness of breath.     cloNIDine 0.1 MG tablet  Commonly known as:  CATAPRES  Take 1 tablet (0.1 mg total) by mouth daily.     clopidogrel 75 MG tablet  Commonly known as:  PLAVIX  Take 75 mg by mouth daily.     furosemide 20 MG tablet  Commonly known as:  LASIX  Take 20 mg by mouth daily.     levETIRAcetam 500 MG tablet   Commonly known as:  KEPPRA  Take 1 tablet (500 mg total) by mouth 2 (two) times daily.     lisinopril 40 MG tablet  Commonly known as:  PRINIVIL,ZESTRIL  Take 40 mg by mouth daily.     MULTIVITAMIN ADULT Chew  Chew 2 each by mouth daily.     NIFEdipine 60 MG 24 hr tablet  Commonly known as:  PROCARDIA XL/ADALAT-CC  Take 1 tablet (60 mg total) by mouth daily.     PHENObarbital 60 MG tablet  Commonly known as:  LUMINAL  Take one and 1/2 tablet by mouth every evening     QVAR 80 MCG/ACT inhaler  Generic drug:  beclomethasone  Inhale 1 puff into the lungs 2 (two) times daily.     rosuvastatin 5 MG tablet  Commonly known as:  CRESTOR  Take 5 mg by mouth every evening.     SEREVENT DISKUS 50 MCG/DOSE diskus inhaler  Generic drug:  salmeterol  Inhale 1 puff into the lungs daily.        No orders of the defined types were placed in this encounter.    Immunization History  Administered Date(s) Administered  . PPD Test 03/19/2015    Social History  Substance Use Topics  . Smoking status: Former Smoker    Types: Cigarettes  Quit date: 02/11/1983  . Smokeless tobacco: Not on file  . Alcohol Use: No    Filed Vitals:   04/08/15 1657  BP: 149/79  Pulse: 90  Temp: 98.2 F (36.8 C)  Resp: 18    Physical Exam  GENERAL APPEARANCE: Alert, conversant. No acute distress.  HEENT: Unremarkable. RESPIRATORY: Breathing is even, unlabored. Lung sounds are clear   CARDIOVASCULAR: Heart RRR no murmurs, rubs or gallops. No peripheral edema.  GASTROINTESTINAL: Abdomen is soft, non-tender, not distended w/ normal bowel sounds.  NEUROLOGIC: Cranial nerves 2-12 grossly intact. Moves all extremities  Patient Active Problem List   Diagnosis Date Noted  . H/O: CVA (cerebrovascular accident) 04/08/2015  . Seizures ([place]) 03/28/2015  . Hypertensive emergency 03/28/2015  . Hyperlipidemia 03/28/2015  . Altered mental status 03/15/2015  . Atrial fibrillation ([place]) 03/15/2015  .  Hypertension 03/15/2015  . Asthma 03/15/2015  . Acute encephalopathy 03/15/2015  . Renal mass, left     CBC    Component Value Date/Time   WBC 5.9 03/19/2015 0530   WBC 5.9 03/15/2015   RBC 4.37 03/19/2015 0530   HGB 13.3 03/19/2015 0530   HCT 40.5 03/19/2015 0530   PLT 171 03/19/2015 0530   MCV 92.7 03/19/2015 0530   LYMPHSABS 2.0 03/15/2015 0434   MONOABS 0.6 03/15/2015 0434   EOSABS 0.3 03/15/2015 0434   BASOSABS 0.0 03/15/2015 0434    CMP     Component Value Date/Time   NA 143 03/19/2015 0530   NA 143 03/15/2015   K 4.1 03/19/2015 0530   CL 106 03/19/2015 0530   CO2 29 03/19/2015 0530   GLUCOSE 101* 03/19/2015 0530   BUN 17 03/19/2015 0530   BUN 16 03/15/2015   CREATININE 1.10 03/19/2015 0530   CREATININE 0.9 03/15/2015   CREATININE 0.87 03/31/2014 1805   CALCIUM 8.9 03/19/2015 0530   PROT 6.5 03/19/2015 0530   ALBUMIN 3.5 03/19/2015 0530   AST 20 03/19/2015 0530   ALT 18 03/19/2015 0530   ALKPHOS 82 03/19/2015 0530   BILITOT 0.4 03/19/2015 0530   GFRNONAA >60 03/19/2015 0530   GFRNONAA 82 03/31/2014 1805   GFRAA >60 03/19/2015 0530   GFRAA >89 03/31/2014 1805    Assessment and Plan  Pt is d/c to home with HH/OT/PT/Nursing. Medications have been reconciled and Rx's written.  Hennie Duos, MD

## 2015-05-06 ENCOUNTER — Other Ambulatory Visit: Payer: Self-pay | Admitting: Internal Medicine

## 2015-06-04 ENCOUNTER — Ambulatory Visit: Payer: BC Managed Care – PPO | Admitting: Neurology

## 2015-06-04 ENCOUNTER — Other Ambulatory Visit: Payer: Self-pay | Admitting: Internal Medicine

## 2015-08-05 ENCOUNTER — Ambulatory Visit: Payer: BC Managed Care – PPO | Admitting: Neurology

## 2015-08-05 DIAGNOSIS — Z029 Encounter for administrative examinations, unspecified: Secondary | ICD-10-CM

## 2015-10-13 ENCOUNTER — Emergency Department (HOSPITAL_COMMUNITY): Payer: Medicare Other

## 2015-10-13 ENCOUNTER — Inpatient Hospital Stay (HOSPITAL_COMMUNITY)
Admission: EM | Admit: 2015-10-13 | Discharge: 2015-10-18 | DRG: 064 | Disposition: A | Discharge status: Skilled Nursing Facility | Payer: Medicare Other | Attending: Internal Medicine | Admitting: Internal Medicine

## 2015-10-13 ENCOUNTER — Encounter (HOSPITAL_COMMUNITY): Payer: Self-pay

## 2015-10-13 DIAGNOSIS — R410 Disorientation, unspecified: Secondary | ICD-10-CM | POA: Diagnosis present

## 2015-10-13 DIAGNOSIS — L03116 Cellulitis of left lower limb: Secondary | ICD-10-CM | POA: Diagnosis present

## 2015-10-13 DIAGNOSIS — I639 Cerebral infarction, unspecified: Secondary | ICD-10-CM

## 2015-10-13 DIAGNOSIS — J449 Chronic obstructive pulmonary disease, unspecified: Secondary | ICD-10-CM | POA: Diagnosis present

## 2015-10-13 DIAGNOSIS — R29702 NIHSS score 2: Secondary | ICD-10-CM | POA: Diagnosis present

## 2015-10-13 DIAGNOSIS — G9389 Other specified disorders of brain: Secondary | ICD-10-CM | POA: Diagnosis present

## 2015-10-13 DIAGNOSIS — Z87891 Personal history of nicotine dependence: Secondary | ICD-10-CM

## 2015-10-13 DIAGNOSIS — R4182 Altered mental status, unspecified: Secondary | ICD-10-CM | POA: Diagnosis not present

## 2015-10-13 DIAGNOSIS — G9340 Encephalopathy, unspecified: Secondary | ICD-10-CM | POA: Diagnosis not present

## 2015-10-13 DIAGNOSIS — R4701 Aphasia: Secondary | ICD-10-CM | POA: Diagnosis present

## 2015-10-13 DIAGNOSIS — I63512 Cerebral infarction due to unspecified occlusion or stenosis of left middle cerebral artery: Secondary | ICD-10-CM | POA: Diagnosis not present

## 2015-10-13 DIAGNOSIS — D329 Benign neoplasm of meninges, unspecified: Secondary | ICD-10-CM

## 2015-10-13 DIAGNOSIS — E669 Obesity, unspecified: Secondary | ICD-10-CM | POA: Diagnosis present

## 2015-10-13 DIAGNOSIS — R569 Unspecified convulsions: Secondary | ICD-10-CM | POA: Diagnosis present

## 2015-10-13 DIAGNOSIS — Z85528 Personal history of other malignant neoplasm of kidney: Secondary | ICD-10-CM

## 2015-10-13 DIAGNOSIS — D320 Benign neoplasm of cerebral meninges: Secondary | ICD-10-CM | POA: Diagnosis present

## 2015-10-13 DIAGNOSIS — Z8673 Personal history of transient ischemic attack (TIA), and cerebral infarction without residual deficits: Secondary | ICD-10-CM

## 2015-10-13 DIAGNOSIS — L03115 Cellulitis of right lower limb: Secondary | ICD-10-CM | POA: Diagnosis present

## 2015-10-13 DIAGNOSIS — Z79899 Other long term (current) drug therapy: Secondary | ICD-10-CM

## 2015-10-13 DIAGNOSIS — J45909 Unspecified asthma, uncomplicated: Secondary | ICD-10-CM | POA: Diagnosis present

## 2015-10-13 DIAGNOSIS — I10 Essential (primary) hypertension: Secondary | ICD-10-CM | POA: Diagnosis present

## 2015-10-13 DIAGNOSIS — Z7951 Long term (current) use of inhaled steroids: Secondary | ICD-10-CM

## 2015-10-13 DIAGNOSIS — Z6835 Body mass index (BMI) 35.0-35.9, adult: Secondary | ICD-10-CM

## 2015-10-13 DIAGNOSIS — Z823 Family history of stroke: Secondary | ICD-10-CM

## 2015-10-13 DIAGNOSIS — R269 Unspecified abnormalities of gait and mobility: Secondary | ICD-10-CM | POA: Diagnosis present

## 2015-10-13 DIAGNOSIS — G8191 Hemiplegia, unspecified affecting right dominant side: Secondary | ICD-10-CM

## 2015-10-13 DIAGNOSIS — G936 Cerebral edema: Secondary | ICD-10-CM | POA: Diagnosis present

## 2015-10-13 DIAGNOSIS — I4891 Unspecified atrial fibrillation: Secondary | ICD-10-CM | POA: Diagnosis present

## 2015-10-13 DIAGNOSIS — I69351 Hemiplegia and hemiparesis following cerebral infarction affecting right dominant side: Secondary | ICD-10-CM

## 2015-10-13 DIAGNOSIS — Z7902 Long term (current) use of antithrombotics/antiplatelets: Secondary | ICD-10-CM

## 2015-10-13 DIAGNOSIS — I69320 Aphasia following cerebral infarction: Secondary | ICD-10-CM

## 2015-10-13 DIAGNOSIS — E785 Hyperlipidemia, unspecified: Secondary | ICD-10-CM | POA: Diagnosis present

## 2015-10-13 LAB — COMPREHENSIVE METABOLIC PANEL
ALBUMIN: 3.4 g/dL — AB (ref 3.5–5.0)
ALK PHOS: 77 U/L (ref 38–126)
ALT: 13 U/L — AB (ref 17–63)
AST: 17 U/L (ref 15–41)
Anion gap: 6 (ref 5–15)
BUN: 17 mg/dL (ref 6–20)
CALCIUM: 8.7 mg/dL — AB (ref 8.9–10.3)
CO2: 29 mmol/L (ref 22–32)
CREATININE: 1.04 mg/dL (ref 0.61–1.24)
Chloride: 108 mmol/L (ref 101–111)
GFR calc Af Amer: >60 mL/min (ref >60)
GFR calc non Af Amer: >60 mL/min (ref >60)
GLUCOSE: 121 mg/dL — AB (ref 65–99)
Potassium: 3.9 mmol/L (ref 3.5–5.1)
SODIUM: 143 mmol/L (ref 135–145)
Total Bilirubin: 0.3 mg/dL (ref 0.3–1.2)
Total Protein: 5.8 g/dL — ABNORMAL LOW (ref 6.5–8.1)

## 2015-10-13 LAB — BASIC METABOLIC PANEL
BUN: 17 mg/dL (ref 4–21)
BUN: 21 mg/dL (ref 4–21)
CREATININE: 1 mg/dL (ref 0.6–1.3)
Creatinine: 1 mg/dL (ref 0.6–1.3)
Glucose: 113 mg/dL
Glucose: 121 mg/dL
Potassium: 3.9 mmol/L (ref 3.4–5.3)
SODIUM: 143 mmol/L (ref 137–147)
Sodium: 144 mmol/L (ref 137–147)

## 2015-10-13 LAB — I-STAT CHEM 8, ED
BUN: 21 mg/dL — ABNORMAL HIGH (ref 6–20)
Calcium, Ion: 1.15 mmol/L (ref 1.15–1.40)
Chloride: 105 mmol/L (ref 101–111)
Creatinine, Ser: 1 mg/dL (ref 0.61–1.24)
Glucose, Bld: 113 mg/dL — ABNORMAL HIGH (ref 65–99)
HCT: 38 % — ABNORMAL LOW (ref 39.0–52.0)
Hemoglobin: 12.9 g/dL — ABNORMAL LOW (ref 13.0–17.0)
Potassium: 3.9 mmol/L (ref 3.5–5.1)
Sodium: 144 mmol/L (ref 135–145)
TCO2: 28 mmol/L (ref 0–100)

## 2015-10-13 LAB — HEPATIC FUNCTION PANEL: Bilirubin, Total: 0.3 mg/dL

## 2015-10-13 LAB — CBC WITH DIFFERENTIAL/PLATELET
Basophils Absolute: 0 10*3/uL (ref 0.0–0.1)
Basophils Relative: 1 %
EOS PCT: 2 %
Eosinophils Absolute: 0.1 10*3/uL (ref 0.0–0.7)
HEMATOCRIT: 38.4 % — AB (ref 39.0–52.0)
Hemoglobin: 12.7 g/dL — ABNORMAL LOW (ref 13.0–17.0)
LYMPHS ABS: 1.8 10*3/uL (ref 0.7–4.0)
LYMPHS PCT: 34 %
MCH: 30.8 pg (ref 26.0–34.0)
MCHC: 33.1 g/dL (ref 30.0–36.0)
MCV: 93 fL (ref 78.0–100.0)
Monocytes Absolute: 0.7 10*3/uL (ref 0.1–1.0)
Monocytes Relative: 13 %
Neutro Abs: 2.8 10*3/uL (ref 1.7–7.7)
Neutrophils Relative %: 50 %
PLATELETS: 155 10*3/uL (ref 150–400)
RBC: 4.13 MIL/uL — AB (ref 4.22–5.81)
RDW: 14.3 % (ref 11.5–15.5)
WBC: 5.5 10*3/uL (ref 4.0–10.5)

## 2015-10-13 LAB — URINALYSIS, ROUTINE W REFLEX MICROSCOPIC
Bilirubin Urine: NEGATIVE
GLUCOSE, UA: NEGATIVE mg/dL
HGB URINE DIPSTICK: NEGATIVE
Ketones, ur: NEGATIVE mg/dL
Nitrite: NEGATIVE
PH: 6 (ref 5.0–8.0)
PROTEIN: NEGATIVE mg/dL
SPECIFIC GRAVITY, URINE: 1.02 (ref 1.005–1.030)

## 2015-10-13 LAB — APTT: aPTT: 30 seconds (ref 24–36)

## 2015-10-13 LAB — PROTIME-INR
INR: 1.12
Prothrombin Time: 14.4 seconds (ref 11.4–15.2)

## 2015-10-13 LAB — URINE MICROSCOPIC-ADD ON: RBC / HPF: NONE SEEN RBC/hpf (ref 0–5)

## 2015-10-13 LAB — I-STAT TROPONIN, ED: Troponin i, poc: 0 ng/mL (ref 0.00–0.08)

## 2015-10-13 LAB — CBC AND DIFFERENTIAL: WBC: 5.5 10*3/mL

## 2015-10-13 MED ORDER — SODIUM CHLORIDE 0.9 % IV SOLN
1000.0000 mg | Freq: Once | INTRAVENOUS | Status: AC
  Administered 2015-10-13: 1000 mg via INTRAVENOUS
  Filled 2015-10-13: qty 10

## 2015-10-13 MED ORDER — BUDESONIDE 0.25 MG/2ML IN SUSP
0.2500 mg | Freq: Two times a day (BID) | RESPIRATORY_TRACT | Status: DC
  Administered 2015-10-14 – 2015-10-18 (×9): 0.25 mg via RESPIRATORY_TRACT
  Filled 2015-10-13 (×9): qty 2

## 2015-10-13 MED ORDER — SODIUM CHLORIDE 0.9 % IV SOLN
500.0000 mg | Freq: Two times a day (BID) | INTRAVENOUS | Status: DC
  Administered 2015-10-13 – 2015-10-18 (×10): 500 mg via INTRAVENOUS
  Filled 2015-10-13 (×15): qty 5

## 2015-10-13 MED ORDER — CLOPIDOGREL BISULFATE 75 MG PO TABS
75.0000 mg | ORAL_TABLET | Freq: Every day | ORAL | Status: DC
  Administered 2015-10-15 – 2015-10-18 (×4): 75 mg via ORAL
  Filled 2015-10-13 (×4): qty 1

## 2015-10-13 MED ORDER — ASPIRIN 300 MG RE SUPP
300.0000 mg | Freq: Every day | RECTAL | Status: DC
  Administered 2015-10-13 – 2015-10-15 (×3): 300 mg via RECTAL
  Filled 2015-10-13 (×3): qty 1

## 2015-10-13 MED ORDER — ASPIRIN 325 MG PO TABS: 325.0000 mg | ORAL_TABLET | Freq: Every day | ORAL | Status: DC

## 2015-10-13 MED ORDER — NIFEDIPINE ER 60 MG PO TB24
60.0000 mg | ORAL_TABLET | Freq: Every day | ORAL | Status: DC
  Administered 2015-10-15 – 2015-10-18 (×4): 60 mg via ORAL
  Filled 2015-10-13 (×5): qty 1

## 2015-10-13 MED ORDER — SENNOSIDES-DOCUSATE SODIUM 8.6-50 MG PO TABS
1.0000 | ORAL_TABLET | Freq: Every evening | ORAL | Status: DC | PRN
Start: 2015-10-13 — End: 2015-10-18

## 2015-10-13 MED ORDER — ALBUTEROL SULFATE (2.5 MG/3ML) 0.083% IN NEBU
3.0000 mL | INHALATION_SOLUTION | RESPIRATORY_TRACT | Status: DC | PRN
  Filled 2015-10-13 (×2): qty 3

## 2015-10-13 MED ORDER — DOXYCYCLINE HYCLATE 100 MG PO TABS: 100.0000 mg | ORAL_TABLET | Freq: Two times a day (BID) | ORAL | Status: DC

## 2015-10-13 MED ORDER — ENOXAPARIN SODIUM 40 MG/0.4ML ~~LOC~~ SOLN
40.0000 mg | SUBCUTANEOUS | Status: DC
  Administered 2015-10-13 – 2015-10-17 (×5): 40 mg via SUBCUTANEOUS
  Filled 2015-10-13 (×5): qty 0.4

## 2015-10-13 MED ORDER — PHENOBARBITAL SODIUM 130 MG/ML IJ SOLN
90.0000 mg | Freq: Every day | INTRAMUSCULAR | Status: DC
  Administered 2015-10-13 – 2015-10-17 (×5): 90 mg via INTRAVENOUS
  Filled 2015-10-13 (×5): qty 1

## 2015-10-13 MED ORDER — ROSUVASTATIN CALCIUM 5 MG PO TABS
5.0000 mg | ORAL_TABLET | Freq: Every evening | ORAL | Status: DC
  Administered 2015-10-15 – 2015-10-17 (×2): 5 mg via ORAL
  Filled 2015-10-13 (×3): qty 1

## 2015-10-13 MED ORDER — ADULT MULTIVITAMIN W/MINERALS CH
1.0000 | ORAL_TABLET | Freq: Every day | ORAL | Status: DC
  Administered 2015-10-15 – 2015-10-18 (×4): 1 via ORAL
  Filled 2015-10-13 (×4): qty 1

## 2015-10-13 MED ORDER — STROKE: EARLY STAGES OF RECOVERY BOOK
Freq: Once | Status: AC
  Administered 2015-10-13: 1

## 2015-10-13 MED ORDER — ARFORMOTEROL TARTRATE 15 MCG/2ML IN NEBU
15.0000 ug | INHALATION_SOLUTION | Freq: Two times a day (BID) | RESPIRATORY_TRACT | Status: DC
  Administered 2015-10-14 – 2015-10-18 (×9): 15 ug via RESPIRATORY_TRACT
  Filled 2015-10-13 (×11): qty 2

## 2015-10-13 MED ORDER — FUROSEMIDE 20 MG PO TABS: 20.0000 mg | ORAL_TABLET | Freq: Every day | ORAL | Status: DC

## 2015-10-13 NOTE — Consult Note (Signed)
Neurology Consultation Reason for Consult: Speech abnormality Referring Physician: Roxine Caddy  CC: Speech of her maladies  History is obtained from: Son  HPI: Esmael Zapien is a 82 y.o. male with a history of A. fib, seizures who presents with difficulty speaking since awakening this morning. He was apparently normal day yesterday, but is been abnormal ever since he woke up. His son has also noticed that he has been looking towards the left and not the right which is new today as well.   LKW: 10/9 prior to bed tpa given?: no, out of window    ROS: Unable to obtain due to altered mental status.   Past Medical History:  Diagnosis Date  . Asthma   . Atrial fibrillation ([place])   . Hypertension   . lt renal ca dx'd 03/2010  . Seizures ([place])   . Stroke [facility])      Family history: Son -venous stasis edema   Social History:  reports that he quit smoking about 32 years ago. His smoking use included Cigarettes. He has quit using smokeless tobacco. He reports that he does not drink alcohol or use drugs.   Exam: Current vital signs: BP 144/77   Pulse 67   Temp 98.6 F (37 C) (Oral)   Resp 18   SpO2 97%  Vital signs in last 24 hours: Temp:  [97.8 F (36.6 C)-98.6 F (37 C)] 98.6 F (37 C) (10/10 1416) Pulse Rate:  [56-69] 67 (10/10 1416) Resp:  [16-19] 18 (10/10 1500) BP: (136-160)/(59-81) 144/77 (10/10 1500) SpO2:  [91 %-100 %] 97 % (10/10 1416)   Physical Exam  Constitutional: Appears well-developed and well-nourished.  Psych: Affect appropriate to situation Eyes: No scleral injection HENT: No OP obstrucion Head: Normocephalic.  Cardiovascular: Irregular Respiratory: Effort normal GI: Soft.  No distension. There is no tenderness.  Skin: Severe lower extremity edema  Neuro: Mental Status: Patient is awake, alert, he has a dense aphasia, with only garbled nonsensical speech. Cranial Nerves: II: Does not blink to threat from the right, consistently does from the  left Pupils are equal, round, and reactive to light.   III,IV, VI: Left gaze preference V: Facial sensation is symmetric to temperature VII: Facial movement is mildly decreased on the right VIII: hearing is intact to voice X: Uvula elevates symmetrically XI: Shoulder shrug is symmetric. XII: tongue is midline without atrophy or fasciculations.  Motor: He does not cooperate for formal testing, but he does seem to move his right side less than the left. He does voluntarily move both sides. Sensory: Sensation is symmetric to light touch and temperature in the arms and legs. Cerebellar: Does not perform      I have reviewed labs in epic and the results pertinent to this consultation are: CMP-unremarkable  I have reviewed the images obtained: CT head-stable findings  Impression: 82 year old male with what I suspect is a posterior division MCA infarct. With new persistent static deficits, I think that seizure is less likely.  Recommendations: 1. HgbA1c, fasting lipid panel 2. MRI, MRA  of the brain without contrast 3. Frequent neuro checks 4. Echocardiogram 5. Carotid dopplers 6. Prophylactic therapy-Antiplatelet med: Aspirin - dose 325mg  PO or 300mg  PR 7. Risk factor modification 8. Telemetry monitoring 9. PT consult, OT consult, Speech consult 10. please page stroke NP  Or  PA  Or MD  from 8am -4 pm starting 10/11 as this patient will be followed by the stroke team at this point.   You can look them  up on [email or web address]      Roland Rack, MD [facility] [phone]  If 7pm- 7am, please page neurology on call as listed in [place].

## 2015-10-13 NOTE — ED Notes (Signed)
Pt urinated. Perineal care done, sheets, gown and blankets changed.

## 2015-10-13 NOTE — H&P (Addendum)
History and Physical    Arthur Wood [id number] DOB: 1931-02-12 DOA: 10/13/2015  PCP: Kandice Hams, MD  Patient coming from: Home   Chief Complaint:   HPI: Arthur Wood is a 82 y.o. male with medical history significant of hypertension, atrial fibrillation, prior stroke with residual numbness to the right side, and seizures who presents with AMS. Per son who provide history patient wake up this morning , wake him up and them went to kitchen for breakfast. His speech was garblish. He didn't pay too much attention to that. Subsequently patient was less interactive, talkative and felt asleep at some point. He notice some changes and brought him for evaluation. He reported similar episode last year, and patient was thought to have a seizure. Per son report patient has been taking medications.   ED Course: normal electrolytes, normal glucose, hb at 12, UA negative for infection, chest x ray no acute cardio-pulmonary abnormalities. CT head; 1. No acute intracranial abnormalities. 2. Stable encephalomalacia involving the right temporal lobe. 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 Similar appearance of left sphenoid wing meningioma with brain edema involving the left temporal lobe. Stable left posterior fossa meningioma.  Review of Systems: unable to obtain due to AMS>   Past Medical History:  Diagnosis Date  . Asthma   . Atrial fibrillation ([place])   . Hypertension   . lt renal ca dx'd 03/2010  . Seizures ([place])   . Stroke [facility])     History reviewed. No pertinent surgical history.   reports that he quit smoking about 32 years ago. His smoking use included Cigarettes. He has quit using smokeless tobacco. He reports that he does not drink alcohol or use drugs.  No Known Allergies  Family history; unable to obtain from patient.   Prior to Admission medications   Medication Sig Start Date End Date Taking? Authorizing Provider  albuterol (PROVENTIL HFA;VENTOLIN HFA)  108 (90 Base) MCG/ACT inhaler Inhale 1-2 puffs into the lungs every 4 (four) hours as needed for wheezing or shortness of breath.    Historical Provider, MD  beclomethasone (QVAR) 80 MCG/ACT inhaler Inhale 1 puff into the lungs 2 (two) times daily.    Historical Provider, MD  cloNIDine (CATAPRES) 0.1 MG tablet Take 1 tablet (0.1 mg total) by mouth daily. 03/19/15   Ripudeep Krystal Eaton, MD  clopidogrel (PLAVIX) 75 MG tablet Take 75 mg by mouth daily.      Historical Provider, MD  furosemide (LASIX) 20 MG tablet Take 20 mg by mouth daily.      Historical Provider, MD  levETIRAcetam (KEPPRA) 500 MG tablet Take 1 tablet (500 mg total) by mouth 2 (two) times daily. 03/19/15   Ripudeep Krystal Eaton, MD  lisinopril (PRINIVIL,ZESTRIL) 40 MG tablet Take 40 mg by mouth daily.      Historical Provider, MD  Multiple Vitamins-Minerals (MULTIVITAMIN ADULT) CHEW Chew 2 each by mouth daily.    Historical Provider, MD  NIFEdipine (PROCARDIA XL/ADALAT-CC) 60 MG 24 hr tablet Take 1 tablet (60 mg total) by mouth daily. 03/19/15   Ripudeep Krystal Eaton, MD  PHENObarbital (LUMINAL) 60 MG tablet Take one and 1/2 tablet by mouth every evening 03/31/15   Estill Dooms, MD  rosuvastatin (CRESTOR) 5 MG tablet Take 5 mg by mouth every evening.     Historical Provider, MD  salmeterol (SEREVENT DISKUS) 50 MCG/DOSE diskus inhaler Inhale 1 puff into the lungs daily.    Historical Provider, MD    Physical Exam: Vitals:   10/13/15  1416 10/13/15 1430 10/13/15 1445 10/13/15 1500  BP: 151/67 136/62 139/71 144/77  Pulse: 67     Resp: 19 16 16 18   Temp: 98.6 F (37 C)     TempSrc: Oral     SpO2: 97%         Constitutional: NAD, calm, comfortable Vitals:   10/13/15 1416 10/13/15 1430 10/13/15 1445 10/13/15 1500  BP: 151/67 136/62 139/71 144/77  Pulse: 67     Resp: 19 16 16 18   Temp: 98.6 F (37 C)     TempSrc: Oral     SpO2: 97%      Eyes: PERRL, lids and conjunctivae normal ENMT: Mucous membranes are moist. Posterior pharynx clear of  any exudate or lesions.Normal dentition.  Neck: normal, supple, no masses, no thyromegaly Respiratory: clear to auscultation bilaterally, no wheezing, no crackles. Normal respiratory effort. No accessory muscle use.  Cardiovascular: Regular rate and rhythm, no murmurs / rubs / gallops. No extremity edema. 2+ pedal pulses. No carotid bruits.  Abdomen: no tenderness, no masses palpated. No hepatosplenomegaly. Bowel sounds positive. Hydrocele  Musculoskeletal: no clubbing / cyanosis.  Skin: Bilateral LE with edema plus 2, mild redness, and multiple small ulcerations  Neurologic: alert, speak with son at times. Moves left arm at times, speech is clear when he say few words, not talkative or following a lot of command,  Psychiatric: unable to assess due to [place] on Admission: I have personally reviewed following labs and imaging studies  CBC:  Recent Labs Lab 10/13/15 1259 10/13/15 1343  WBC  --  5.5  NEUTROABS  --  2.8  HGB 12.9* 12.7*  HCT 38.0* 38.4*  MCV  --  93.0  PLT  --  99991111   Basic Metabolic Panel:  Recent Labs Lab 10/13/15 1244 10/13/15 1259  NA 143 144  K 3.9 3.9  CL 108 105  CO2 29  --   GLUCOSE 121* 113*  BUN 17 21*  CREATININE 1.04 1.00  CALCIUM 8.7*  --    GFR: CrCl cannot be calculated (Unknown ideal weight.). Liver Function Tests:  Recent Labs Lab 10/13/15 1244  AST 17  ALT 13*  ALKPHOS 77  BILITOT 0.3  PROT 5.8*  ALBUMIN 3.4*   No results for input(s): LIPASE, AMYLASE in the last 168 hours. No results for input(s): AMMONIA in the last 168 hours. Coagulation Profile:  Recent Labs Lab 10/13/15 1244  INR 1.12   Cardiac Enzymes: No results for input(s): CKTOTAL, CKMB, CKMBINDEX, TROPONINI in the last 168 hours. BNP (last 3 results) No results for input(s): PROBNP in the last 8760 hours. HbA1C: No results for input(s): HGBA1C in the last 72 hours. CBG: No results for input(s): GLUCAP in the last 168 hours. Lipid Profile: No  results for input(s): CHOL, HDL, LDLCALC, TRIG, CHOLHDL, LDLDIRECT in the last 72 hours. Thyroid Function Tests: No results for input(s): TSH, T4TOTAL, FREET4, T3FREE, THYROIDAB in the last 72 hours. Anemia Panel: No results for input(s): VITAMINB12, FOLATE, FERRITIN, TIBC, IRON, RETICCTPCT in the last 72 hours. Urine analysis:    Component Value Date/Time   COLORURINE YELLOW 10/13/2015 1628   APPEARANCEUR CLEAR 10/13/2015 1628   LABSPEC 1.020 10/13/2015 1628   PHURINE 6.0 10/13/2015 1628   GLUCOSEU NEGATIVE 10/13/2015 1628   HGBUR NEGATIVE 10/13/2015 1628   BILIRUBINUR NEGATIVE 10/13/2015 1628   KETONESUR NEGATIVE 10/13/2015 1628   PROTEINUR NEGATIVE 10/13/2015 1628   UROBILINOGEN 1.0 01/11/2010 0818   NITRITE NEGATIVE 10/13/2015 1628  LEUKOCYTESUR SMALL (A) 10/13/2015 1628   Sepsis Labs: !!!!!!!!!!!!!!!!!!!!!!!!!!!!!!!!!!!!!!!!!!!! @LABRCNTIP (procalcitonin:4,lacticidven:4) )No results found for this or any previous visit (from the past 240 hour(s)).   Radiological Exams on Admission: Dg Chest 2 View  Result Date: 10/13/2015 CLINICAL DATA:  Altered mental status. History of seizures, previous CVA, atrial fibrillation, asthma, former smoker, renal cell malignancy. EXAM: CHEST  2 VIEW COMPARISON:  Portable chest x-ray of March 15, 2015 FINDINGS: The right lung is well-expanded and clear. On the left mild elevation of the hemidiaphragm is again observed. There is no acute infiltrate. There is no pleural effusion or pneumothorax. The cardiac silhouette is enlarged. The pulmonary vascularity is normal. There is calcification in the wall of the aortic arch. There is stable soft tissue fullness in the paratracheal regions that is likely vascular. The bony thorax exhibits no acute abnormality. IMPRESSION: No acute cardiopulmonary abnormality. Stable cardiomegaly. Aortic atherosclerosis. Electronically Signed   By: David  Martinique M.D.   On: 10/13/2015 15:31   Ct Head Wo Contrast  Result  Date: 10/13/2015 CLINICAL DATA:  Difficulty with speech. EXAM: CT HEAD WITHOUT CONTRAST TECHNIQUE: Contiguous axial images were obtained from the base of the skull through the vertex without intravenous contrast. COMPARISON:  03/15/2015 FINDINGS: Brain: Large area of encephalomalacia is again noted involving the right temporal lobe. Similar appearance of low-attenuation edema involving the left temporal lobe with overlying left sphenoid wing meningioma. Left parietal lobe encephalomalacia is again identified and appears unchanged, image 20 of series 201. CSF attenuation overlying the right frontal lobe is unchanged from previous exam measuring 11 mm in thickness, image 25 of series 201. 8 mm meningioma overlying the left cerebellar hemisphere is unchanged, image 5 of series 201. No evidence for acute brain infarct. No intracranial hemorrhage identified. Vascular: No hyperdense vessel or unexpected calcification. Skull: Previous right parietal craniotomy and left frontal craniotomy. Sinuses/Orbits: No acute finding. Other: None. IMPRESSION: 1. No acute intracranial abnormalities. 2. Stable encephalomalacia involving the right temporal lobe. 3. 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 4. Similar appearance of left sphenoid wing meningioma with brain edema involving the left temporal lobe. 5. Stable left posterior fossa meningioma. Electronically Signed   By: Kerby Moors M.D.   On: 10/13/2015 13:21    EKG: Independently reviewed. Sinus, prolong PR.   Assessment/Plan Active Problems:   Altered mental status   Hypertension   Asthma   Acute encephalopathy   Seizures ([place])   H/O: CVA (cerebrovascular accident)   Confusion  1-Confusion, less interactive, less talkative. Not responsive.  Differential broad, TIA, Vs stroke vs seizure . Unclear if CT finding with meningioma related to events.  Admit to telemetry.  Neurology consulted.  Continue with anti-seizure medications.  Will get  MRI, doppler. ECHO On plavix.  Permissive HTN in case stroke.  Aspirin   2-LE cellulitis right worse than left.  LE edema, check eco.  Continue with lasix. Start oral doxy.  Wound care consulted.   3-HTN;  Continue with nifedipine, hold  Clonidine, permissive HTN in setting stroke. Marland Kitchen  Hold lisinopril.   4-COPD;  Continue with salmeterol.   5- Seizure; continue with phenobarbital and keppra.   6-Meningioma on CT; MRI ordered.   DVT prophylaxis: Lovenox Code Status:  Presume full code, son at bedside is not the POA, Patient POA lives in another state and we dont have number available. Son at bedside will try to get number.  Family Communication:  Disposition Plan; to be determine.  Consults called: Neurology  Admission status:  observation, telemetry    Niel Hummer A MD [facility] Pager [phone]  If 7PM-7AM, please contact night-coverage [email or web address] Password [email or web address]  10/13/2015, 5:41 PM

## 2015-10-13 NOTE — ED Notes (Signed)
Pt.s sons Rodderick Orsak POA [phone]

## 2015-10-13 NOTE — ED Triage Notes (Signed)
Pt. 's son lives with him and last night he went to bed at 21:30 and when he woke up this morning son reports that he came to his room and woke him up and was walking without any difficutly.  He was not speaking, son did not feel that was a problem at that time but as the morning progressed , pt.'s son became concerned that the pt. Was not able to speak appropriately.  He ate his breakfast  And took his am meds.  AT the present time he is alert but not talking at this time.  He is moving his extremeties.  Airway intact.

## 2015-10-13 NOTE — ED Provider Notes (Signed)
[facility] DEPT Provider Note   CSN: [id number] Arrival date & time: 10/13/15  1200     History   Chief Complaint Chief Complaint  Patient presents with  . Altered Mental Status    HPI Arthur Wood is a 82 y.o. male.  Patient is an 82 year old male with a history of hypertension, atrial fibrillation, prior stroke with residual numbness to the right side, and seizures. His son states he woke up this morning and the patient was doing his normal routine but was non-verbal. The son noted that as he started asking the patient questions the patient had garbled speech. He's been ambulating normally. He's been moving his extremities symmetrically. He ambulates with a walker which was unchanged today. The son is not sure when the symptoms started. He feels that they were there this morning when the patient woke up. He however first definitely noticed some around lunchtime when he was asking the patient questions and his speech was garbled. However the patient had been nonverbal prior to that and would not answer questions when the son directly asked him questions. This done states that he has "brain seizures" that occur when he has spells like this. He doesn't have shaking of his extremities. He denies any recent illnesses. No fevers coughing colds vomiting or other recent illnesses.    Altered Mental Status      Past Medical History:  Diagnosis Date  . Asthma   . Atrial fibrillation ([place])   . Hypertension   . lt renal ca dx'd 03/2010  . Seizures ([place])   . Stroke [facility])     Patient Active Problem List   Diagnosis Date Noted  . H/O: CVA (cerebrovascular accident) 04/08/2015  . Seizures ([place]) 03/28/2015  . Hypertensive emergency 03/28/2015  . Hyperlipidemia 03/28/2015  . Altered mental status 03/15/2015  . Atrial fibrillation ([place]) 03/15/2015  . Hypertension 03/15/2015  . Asthma 03/15/2015  . Acute encephalopathy 03/15/2015  . Renal mass, left     History reviewed. No  pertinent surgical history.     Home Medications    Prior to Admission medications   Medication Sig Start Date End Date Taking? Authorizing Provider  albuterol (PROVENTIL HFA;VENTOLIN HFA) 108 (90 Base) MCG/ACT inhaler Inhale 1-2 puffs into the lungs every 4 (four) hours as needed for wheezing or shortness of breath.    Historical Provider, MD  beclomethasone (QVAR) 80 MCG/ACT inhaler Inhale 1 puff into the lungs 2 (two) times daily.    Historical Provider, MD  cloNIDine (CATAPRES) 0.1 MG tablet Take 1 tablet (0.1 mg total) by mouth daily. 03/19/15   Ripudeep Krystal Eaton, MD  clopidogrel (PLAVIX) 75 MG tablet Take 75 mg by mouth daily.      Historical Provider, MD  furosemide (LASIX) 20 MG tablet Take 20 mg by mouth daily.      Historical Provider, MD  levETIRAcetam (KEPPRA) 500 MG tablet Take 1 tablet (500 mg total) by mouth 2 (two) times daily. 03/19/15   Ripudeep Krystal Eaton, MD  lisinopril (PRINIVIL,ZESTRIL) 40 MG tablet Take 40 mg by mouth daily.      Historical Provider, MD  Multiple Vitamins-Minerals (MULTIVITAMIN ADULT) CHEW Chew 2 each by mouth daily.    Historical Provider, MD  NIFEdipine (PROCARDIA XL/ADALAT-CC) 60 MG 24 hr tablet Take 1 tablet (60 mg total) by mouth daily. 03/19/15   Ripudeep Krystal Eaton, MD  PHENObarbital (LUMINAL) 60 MG tablet Take one and 1/2 tablet by mouth every evening 03/31/15   Estill Dooms, MD  rosuvastatin (  CRESTOR) 5 MG tablet Take 5 mg by mouth every evening.     Historical Provider, MD  salmeterol (SEREVENT DISKUS) 50 MCG/DOSE diskus inhaler Inhale 1 puff into the lungs daily.    Historical Provider, MD    Family History History reviewed. No pertinent family history.  Social History Social History  Substance Use Topics  . Smoking status: Former Smoker    Types: Cigarettes    Quit date: 02/11/1983  . Smokeless tobacco: Former Systems developer  . Alcohol use No     Allergies   Review of patient's allergies indicates no known allergies.   Review of Systems Review of  Systems  Unable to perform ROS: Patient nonverbal     Physical Exam Updated Vital Signs BP 144/77   Pulse 67   Temp 98.6 F (37 C) (Oral)   Resp 18   SpO2 97%   Physical Exam  Constitutional: He appears well-developed and well-nourished.  HENT:  Head: Normocephalic and atraumatic.  Eyes: Pupils are equal, round, and reactive to light.  Neck: Normal range of motion. Neck supple.  Cardiovascular: Normal rate, regular rhythm and normal heart sounds.   Pulmonary/Chest: Effort normal and breath sounds normal. No respiratory distress. He has no wheezes. He has no rales. He exhibits no tenderness.  Abdominal: Soft. Bowel sounds are normal. There is no tenderness. There is no rebound and no guarding.  Musculoskeletal: Normal range of motion. He exhibits edema.  Lymphadenopathy:    He has no cervical adenopathy.  Neurological: He is alert.  Patient is awake with eyes open but is nonverbal. He doesn't really follow commands but does have equal shrink in his upper extremities. He withdraws both of his lower extremities to Babinski. No obvious facial droop.  Skin: Skin is warm and dry. No rash noted.  Psychiatric: He has a normal mood and affect.     ED Treatments / Results  Labs (all labs ordered are listed, but only abnormal results are displayed) Labs Reviewed  COMPREHENSIVE METABOLIC PANEL - Abnormal; Notable for the following:       Result Value   Glucose, Bld 121 (*)    Calcium 8.7 (*)    Total Protein 5.8 (*)    Albumin 3.4 (*)    ALT 13 (*)    All other components within normal limits  CBC WITH DIFFERENTIAL/PLATELET - Abnormal; Notable for the following:    RBC 4.13 (*)    Hemoglobin 12.7 (*)    HCT 38.4 (*)    All other components within normal limits  I-STAT CHEM 8, ED - Abnormal; Notable for the following:    BUN 21 (*)    Glucose, Bld 113 (*)    Hemoglobin 12.9 (*)    HCT 38.0 (*)    All other components within normal limits  PROTIME-INR  APTT  URINALYSIS,  ROUTINE W REFLEX MICROSCOPIC (NOT AT [facility])  I-STAT TROPOININ, ED  CBG MONITORING, ED    EKG  EKG Interpretation  Date/Time:  Tuesday October 13 2015 12:14:41 EDT Ventricular Rate:  68 PR Interval:    QRS Duration: 169 QT Interval:  451 QTC Calculation: 480 R Axis:   52 Text Interpretation:  Sinus rhythm Prolonged PR interval Right bundle branch block since last tracing no significant change Confirmed by Sharief Wainwright  MD, [place] Huntsman ([id number]) on 10/13/2015 12:26:29 PM       Radiology Dg Chest 2 View  Result Date: 10/13/2015 CLINICAL DATA:  Altered mental status. History of seizures, previous CVA, atrial fibrillation, asthma,  former smoker, renal cell malignancy. EXAM: CHEST  2 VIEW COMPARISON:  Portable chest x-ray of March 15, 2015 FINDINGS: The right lung is well-expanded and clear. On the left mild elevation of the hemidiaphragm is again observed. There is no acute infiltrate. There is no pleural effusion or pneumothorax. The cardiac silhouette is enlarged. The pulmonary vascularity is normal. There is calcification in the wall of the aortic arch. There is stable soft tissue fullness in the paratracheal regions that is likely vascular. The bony thorax exhibits no acute abnormality. IMPRESSION: No acute cardiopulmonary abnormality. Stable cardiomegaly. Aortic atherosclerosis. Electronically Signed   By: David  Martinique M.D.   On: 10/13/2015 15:31   Ct Head Wo Contrast  Result Date: 10/13/2015 CLINICAL DATA:  Difficulty with speech. EXAM: CT HEAD WITHOUT CONTRAST TECHNIQUE: Contiguous axial images were obtained from the base of the skull through the vertex without intravenous contrast. COMPARISON:  03/15/2015 FINDINGS: Brain: Large area of encephalomalacia is again noted involving the right temporal lobe. Similar appearance of low-attenuation edema involving the left temporal lobe with overlying left sphenoid wing meningioma. Left parietal lobe encephalomalacia is again identified and appears  unchanged, image 20 of series 201. CSF attenuation overlying the right frontal lobe is unchanged from previous exam measuring 11 mm in thickness, image 25 of series 201. 8 mm meningioma overlying the left cerebellar hemisphere is unchanged, image 5 of series 201. No evidence for acute brain infarct. No intracranial hemorrhage identified. Vascular: No hyperdense vessel or unexpected calcification. Skull: Previous right parietal craniotomy and left frontal craniotomy. Sinuses/Orbits: No acute finding. Other: None. IMPRESSION: 1. No acute intracranial abnormalities. 2. Stable encephalomalacia involving the right temporal lobe. 3. 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 4. Similar appearance of left sphenoid wing meningioma with brain edema involving the left temporal lobe. 5. Stable left posterior fossa meningioma. Electronically Signed   By: Kerby Moors M.D.   On: 10/13/2015 13:21    Procedures Procedures (including critical care time)  Medications Ordered in ED Medications - No data to display   Initial Impression / Assessment and Plan / ED Course  I have reviewed the triage vital signs and the nursing notes.  Pertinent labs & imaging results that were available during my care of the patient were reviewed by me and considered in my medical decision making (see chart for details).  Clinical Course    Patient presents with speech deficits and confusion. He's had similar symptoms in the past. It's unclear whether this is a possible TIA versus seizures. He does have a history of seizures and is on Keppra. He's had no visualized seizure activity. His head CT is negative for acute disease. He doesn't have any focal deficits. He is starting to become more communicative but it is still mostly incomprehensible. I'll consult the hospitalist for admission.  Spoke with hospitalist who will admit the pt.  She requests neuro consult as well.  Final Clinical Impressions(s) / ED Diagnoses     Final diagnoses:  Altered mental status, unspecified altered mental status type    New Prescriptions New Prescriptions   No medications on file     Malvin Johns, MD 10/13/15 [phone]

## 2015-10-13 NOTE — ED Notes (Signed)
Unable to do NIH< pt. Will not respond.  He looks the RN, he is moving extremities, He is not talking or following commands.

## 2015-10-13 NOTE — ED Notes (Signed)
Pt disoriented x 4, verbally incomprehensible.  Pt wakening as time progresses, but arrived lethargic.  Pt DID NOT PASS swallow screen.  Unable to get a full NIH due to patient unable to follow any commands or respond verbally inappropriately.  Possible seizures, and pt started on Keppra bolus, per neurology consult.    Head CT shows chronic, but no acute abnormalities.  Vitals stable.  Family at bedside

## 2015-10-14 ENCOUNTER — Observation Stay (HOSPITAL_BASED_OUTPATIENT_CLINIC_OR_DEPARTMENT_OTHER)
Admit: 2015-10-14 | Discharge: 2015-10-14 | Disposition: A | Discharge status: Home / Self Care | Payer: Medicare Other | Attending: Neurology | Admitting: Neurology

## 2015-10-14 ENCOUNTER — Observation Stay (HOSPITAL_COMMUNITY): Payer: Medicare Other

## 2015-10-14 ENCOUNTER — Encounter (HOSPITAL_COMMUNITY): Payer: Self-pay | Admitting: General Practice

## 2015-10-14 DIAGNOSIS — Z79899 Other long term (current) drug therapy: Secondary | ICD-10-CM | POA: Diagnosis not present

## 2015-10-14 DIAGNOSIS — I4891 Unspecified atrial fibrillation: Secondary | ICD-10-CM | POA: Diagnosis present

## 2015-10-14 DIAGNOSIS — Z7951 Long term (current) use of inhaled steroids: Secondary | ICD-10-CM | POA: Diagnosis not present

## 2015-10-14 DIAGNOSIS — R4182 Altered mental status, unspecified: Secondary | ICD-10-CM

## 2015-10-14 DIAGNOSIS — D329 Benign neoplasm of meninges, unspecified: Secondary | ICD-10-CM

## 2015-10-14 DIAGNOSIS — Z8673 Personal history of transient ischemic attack (TIA), and cerebral infarction without residual deficits: Secondary | ICD-10-CM

## 2015-10-14 DIAGNOSIS — L03115 Cellulitis of right lower limb: Secondary | ICD-10-CM | POA: Diagnosis present

## 2015-10-14 DIAGNOSIS — Z87891 Personal history of nicotine dependence: Secondary | ICD-10-CM | POA: Diagnosis not present

## 2015-10-14 DIAGNOSIS — G8191 Hemiplegia, unspecified affecting right dominant side: Secondary | ICD-10-CM | POA: Diagnosis not present

## 2015-10-14 DIAGNOSIS — Z6835 Body mass index (BMI) 35.0-35.9, adult: Secondary | ICD-10-CM | POA: Diagnosis not present

## 2015-10-14 DIAGNOSIS — Z85528 Personal history of other malignant neoplasm of kidney: Secondary | ICD-10-CM | POA: Diagnosis not present

## 2015-10-14 DIAGNOSIS — L03116 Cellulitis of left lower limb: Secondary | ICD-10-CM | POA: Diagnosis present

## 2015-10-14 DIAGNOSIS — R4701 Aphasia: Secondary | ICD-10-CM | POA: Diagnosis present

## 2015-10-14 DIAGNOSIS — I6789 Other cerebrovascular disease: Secondary | ICD-10-CM | POA: Diagnosis not present

## 2015-10-14 DIAGNOSIS — I69351 Hemiplegia and hemiparesis following cerebral infarction affecting right dominant side: Secondary | ICD-10-CM | POA: Diagnosis not present

## 2015-10-14 DIAGNOSIS — J449 Chronic obstructive pulmonary disease, unspecified: Secondary | ICD-10-CM | POA: Diagnosis present

## 2015-10-14 DIAGNOSIS — R269 Unspecified abnormalities of gait and mobility: Secondary | ICD-10-CM | POA: Diagnosis not present

## 2015-10-14 DIAGNOSIS — R29702 NIHSS score 2: Secondary | ICD-10-CM | POA: Diagnosis present

## 2015-10-14 DIAGNOSIS — Z7902 Long term (current) use of antithrombotics/antiplatelets: Secondary | ICD-10-CM | POA: Diagnosis not present

## 2015-10-14 DIAGNOSIS — E785 Hyperlipidemia, unspecified: Secondary | ICD-10-CM | POA: Diagnosis not present

## 2015-10-14 DIAGNOSIS — G936 Cerebral edema: Secondary | ICD-10-CM | POA: Diagnosis present

## 2015-10-14 DIAGNOSIS — G9389 Other specified disorders of brain: Secondary | ICD-10-CM | POA: Diagnosis present

## 2015-10-14 DIAGNOSIS — I10 Essential (primary) hypertension: Secondary | ICD-10-CM | POA: Diagnosis not present

## 2015-10-14 DIAGNOSIS — I639 Cerebral infarction, unspecified: Secondary | ICD-10-CM | POA: Diagnosis present

## 2015-10-14 DIAGNOSIS — I63412 Cerebral infarction due to embolism of left middle cerebral artery: Secondary | ICD-10-CM

## 2015-10-14 DIAGNOSIS — D320 Benign neoplasm of cerebral meninges: Secondary | ICD-10-CM | POA: Diagnosis present

## 2015-10-14 DIAGNOSIS — R410 Disorientation, unspecified: Secondary | ICD-10-CM

## 2015-10-14 DIAGNOSIS — Z823 Family history of stroke: Secondary | ICD-10-CM | POA: Diagnosis not present

## 2015-10-14 DIAGNOSIS — E669 Obesity, unspecified: Secondary | ICD-10-CM | POA: Diagnosis present

## 2015-10-14 DIAGNOSIS — I69320 Aphasia following cerebral infarction: Secondary | ICD-10-CM | POA: Diagnosis not present

## 2015-10-14 DIAGNOSIS — R569 Unspecified convulsions: Secondary | ICD-10-CM | POA: Diagnosis not present

## 2015-10-14 DIAGNOSIS — G9340 Encephalopathy, unspecified: Secondary | ICD-10-CM | POA: Diagnosis not present

## 2015-10-14 DIAGNOSIS — I63512 Cerebral infarction due to unspecified occlusion or stenosis of left middle cerebral artery: Secondary | ICD-10-CM | POA: Diagnosis present

## 2015-10-14 LAB — LIPID PANEL
CHOL/HDL RATIO: 2.7 ratio
Cholesterol: 137 mg/dL (ref 0–200)
HDL: 50 mg/dL (ref >40)
LDL CALC: 71 mg/dL (ref 0–99)
Triglycerides: 78 mg/dL (ref <150)
VLDL: 16 mg/dL (ref 0–40)

## 2015-10-14 MED ORDER — FUROSEMIDE 10 MG/ML IJ SOLN
20.0000 mg | Freq: Every day | INTRAMUSCULAR | Status: DC
  Administered 2015-10-14 – 2015-10-18 (×5): 20 mg via INTRAVENOUS
  Filled 2015-10-14 (×5): qty 4

## 2015-10-14 MED ORDER — LORAZEPAM 2 MG/ML IJ SOLN: 1.0000 mg | Freq: Once | INTRAMUSCULAR | Status: DC | PRN

## 2015-10-14 MED ORDER — IOPAMIDOL (ISOVUE-370) INJECTION 76%
INTRAVENOUS | Status: AC
  Administered 2015-10-14: 05:00:00
  Filled 2015-10-14: qty 100

## 2015-10-14 MED ORDER — IOPAMIDOL (ISOVUE-370) INJECTION 76%
80.0000 mL | Freq: Once | INTRAVENOUS | Status: AC | PRN
  Administered 2015-10-14: 80 mL via INTRAVENOUS

## 2015-10-14 MED ORDER — LORAZEPAM 2 MG/ML IJ SOLN
0.5000 mg | Freq: Once | INTRAMUSCULAR | Status: AC
  Administered 2015-10-14: 0.5 mg via INTRAVENOUS
  Filled 2015-10-14: qty 1

## 2015-10-14 NOTE — Progress Notes (Signed)
EEG Completed; Results Pending  

## 2015-10-14 NOTE — Progress Notes (Signed)
Pt in Radiology from 0130 until 0455, MRI unsuccessful, CTA of head and neck done resuming neuro and vital cks at 0500.

## 2015-10-14 NOTE — Consult Note (Addendum)
[facility] Nurse wound consult note Reason for Consult: Consult requested for bilat legs.  Pt was previously reported to have cellulitis upon admission which appears to have improved at this time.  Generalized edema and few patchy areas of dried scabs, but no erythremia, open wounds, drainage, or fluctuance requiring topical treatment was noted. Please re-consult if further assistance is needed.  Thank-you,  Julien Girt MSN, [place], [place], [place], [place]

## 2015-10-14 NOTE — Progress Notes (Signed)
Attempting to clarify patient's MPOA.   The spouse who is listed, is apparently deceased.  Son who was present during H&P is not MPOA.  MPOA may be son calling from [place]- [phone] Arthur Wood

## 2015-10-14 NOTE — Procedures (Signed)
ELECTROENCEPHALOGRAM REPORT  Date of Study: 10/14/2015  Patient's Name: Arthur Wood MRN: [id number] Date of Birth: 08-14-1931  Referring Provider: Dr. Rosalin Hawking  Clinical History: This is an 82 year old man with a history of seizures presenting with altered mental status.  Medications: PHENObarbital (LUMINAL) injection 90 mg  levETIRAcetam (KEPPRA) 500 mg in sodium chloride 0.9 % 100 mL IVPB  albuterol (PROVENTIL) (2.5 MG/3ML) 0.083% nebulizer solution 3 mL  arformoterol (BROVANA) nebulizer solution 15 mcg  aspirin suppository 300 mg  budesonide (PULMICORT) nebulizer solution 0.25 mg  clopidogrel (PLAVIX) tablet 75 mg  doxycycline (VIBRA-TABS) tablet 100 mg  enoxaparin (LOVENOX) injection 40 mg  furosemide (LASIX) tablet 20 mg  multivitamin with minerals tablet 1 tablet  NIFEdipine (PROCARDIA-XL/ADALAT CC) 24 hr tablet 60 mg  rosuvastatin (CRESTOR) tablet 5 mg  senna-docusate (Senokot-S) tablet 1 tablet   Technical Summary: A multichannel digital EEG recording measured by the international 10-20 system with electrodes applied with paste and impedances below 5000 ohms performed as portable with EKG monitoring in an awake and asleep patient.  Hyperventilation and photic stimulation were not performed.  The digital EEG was referentially recorded, reformatted, and digitally filtered in a variety of bipolar and referential montages for optimal display.   Description: The patient is awake and asleep during the recording. He is noted to be confused. There is no clear posterior dominant rhythm. The background consists of a large amount of amount of diffuse 4-5 Hz theta and 2-3 Hz delta slowing. There is additional focal 4-5 Hz theta slowing over the left frontotemporal region, at times sharply contoured without clear epileptogenic potential. During drowsiness and sleep, there is an increase in theta and delta slowing of the background, with poorly formed vertex waves and sleep spindles  seen.  Hyperventilation and photic stimulation were not performed. There were no clear epileptiform discharges or electrographic seizures seen.    EKG lead was unremarkable.  Impression: This awake and asleep EEG is abnormal due to the presence of: 1. Moderate diffuse slowing of the background 2. Additional focal slowing over the left frontotemporal region  Clinical Correlation of the above findings indicates diffuse cerebral dysfunction that is non-specific in etiology and can be seen with hypoxic/ischemic injury, toxic/metabolic encephalopathies, neurodegenerative disorders, or medication effect. Focal slowing over the left frontotemporal region indicates focal cerebral dysfunction in this region suggestive of underlying structural or physiologic abnormality.  The absence of epileptiform discharges does not rule out a clinical diagnosis of epilepsy.  There were no electrographic seizures seen in this study. Clinical correlation is advised.   Ellouise Newer, M.D.

## 2015-10-14 NOTE — Progress Notes (Signed)
PT Cancellation Note  Patient Details Name: Daiyaan Mezzanotte MRN: [id number] DOB: November 11, 1931   Cancelled Treatment:    Reason Eval/Treat Not Completed: Patient at procedure or test/unavailable. PT to return as able.   Kingsley Callander 10/14/2015, 10:39 AM   Kittie Plater, PT, DPT Pager #: [phone] Office #: [phone]

## 2015-10-14 NOTE — Progress Notes (Signed)
OT Cancellation Note  Patient Details Name: Arthur Wood MRN: [id number] DOB: 01-09-1931   Cancelled Treatment:    Reason Eval/Treat Not Completed: Patient at procedure or test/ unavailable. 2nd attempt at OT eval today, pt currently off the floor. Will follow up as time allows.  Binnie Kand M.S., OTR/L Pager: [phone]  10/14/2015, 2:20 PM

## 2015-10-14 NOTE — Progress Notes (Signed)
Pt went down for MRI around 0130, they called back around 0200 saying pt was not cooperating , obtained order for .5 mg of ativan went down to MRI and gave to patient.

## 2015-10-14 NOTE — Progress Notes (Signed)
OT Cancellation Note  Patient Details Name: Morry Bazil MRN: [id number] DOB: 27-Feb-1931   Cancelled Treatment:    Reason Eval/Treat Not Completed: Patient at procedure or test/ unavailable. Will follow up for OT eval as time allows.  Binnie Kand M.S., OTR/L Pager: [phone]  10/14/2015, 10:35 AM

## 2015-10-14 NOTE — Progress Notes (Signed)
SLP Cancellation Note  Patient Details Name: Arthur Wood MRN: [id number] DOB: 1931/12/05   Cancelled treatment:        Attempted to see for swallow and speech-cognitive assessment. Pt currently in MRI. Will continue efforts.    Houston Siren 10/14/2015, 1:32 PM   Orbie Pyo Colvin Caroli.Ed [employer] [phone]

## 2015-10-14 NOTE — Progress Notes (Signed)
PT Cancellation Note  Patient Details Name: Arthur Wood MRN: [id number] DOB: 1931/11/27   Cancelled Treatment:    Reason Eval/Treat Not Completed: Patient at procedure or test/unavailable. PT to return as able to complete evaluation.   Kingsley Callander 10/14/2015, 1:56 PM   Kittie Plater, PT, DPT Pager #: [phone] Office #: [phone]

## 2015-10-14 NOTE — Progress Notes (Addendum)
Tyller Sartini pt's eldest son has financial power of attorney but not health care POA, he is in [place] and making arrangements to fly in, possible Friday 10/13. He does not fell his younger brother is able to manage their Fathers situation. His phone number in case of emergency is [phone]. Pts son in question was with pt when admitted to this floor and I agree with elder son regarding his brothers inability to handle making health care decisions for the Father.

## 2015-10-14 NOTE — Progress Notes (Signed)
STROKE TEAM PROGRESS NOTE   HISTORY OF PRESENT ILLNESS (per record) Arthur Wood is a 82 y.o. male with a history of A. fib, seizures who presents with difficulty speaking since awakening this morning. He was apparently normal day yesterday, but is been abnormal ever since he woke up (LKW 10/12/2015 prior to bed). His son has also noticed that he has been looking towards the left and not the right which is new today as well. Patient was not administered IV t-PA secondary to delay in arrival. He was admitted for further evaluation and treatment.   SUBJECTIVE (INTERVAL HISTORY)  No family is at bedside. Pt continues to have global aphasia and mild right sided weakness. However, pt condition at baseline not known. He has significant brain pathology so his neuro baseline at home is questionable.    OBJECTIVE Temp:  [97.6 F (36.4 C)-99 F (37.2 C)] 97.6 F (36.4 C) (10/11 0830) Pulse Rate:  [56-88] 88 (10/11 0830) Cardiac Rhythm: Atrial fibrillation (10/11 0840) Resp:  [16-20] 20 (10/11 0830) BP: (128-187)/(59-116) 136/68 (10/11 0830) SpO2:  [91 %-100 %] 98 % (10/11 0856) Weight:  [108.5 kg (239 lb 3.2 oz)] 108.5 kg (239 lb 3.2 oz) (10/10 2028)  CBC:  Recent Labs Lab 10/13/15 1259 10/13/15 1343  WBC  --  5.5  NEUTROABS  --  2.8  HGB 12.9* 12.7*  HCT 38.0* 38.4*  MCV  --  93.0  PLT  --  99991111    Basic Metabolic Panel:  Recent Labs Lab 10/13/15 1244 10/13/15 1259  NA 143 144  K 3.9 3.9  CL 108 105  CO2 29  --   GLUCOSE 121* 113*  BUN 17 21*  CREATININE 1.04 1.00  CALCIUM 8.7*  --     Lipid Panel:    Component Value Date/Time   CHOL 137 10/14/2015 0532   TRIG 78 10/14/2015 0532   HDL 50 10/14/2015 0532   CHOLHDL 2.7 10/14/2015 0532   VLDL 16 10/14/2015 0532   LDLCALC 71 10/14/2015 0532   HgbA1c:  Lab Results  Component Value Date   HGBA1C (H) 01/11/2010    5.9 (NOTE)                                                                       According to the ADA  Clinical Practice Recommendations for 2011, when HbA1c is used as a screening test:   >=6.5%   Diagnostic of Diabetes Mellitus           (if abnormal result  is confirmed)  5.7-6.4%   Increased risk of developing Diabetes Mellitus  References:Diagnosis and Classification of Diabetes Mellitus,Diabetes [id number] 1):S62-S69 and Standards of Medical Care in         Diabetes - 2011,Diabetes Care,2011,34  (Suppl 1):S11-S61.   Urine Drug Screen:    Component Value Date/Time   LABOPIA NONE DETECTED 03/15/2015 1035   COCAINSCRNUR NONE DETECTED 03/15/2015 1035   LABBENZ NONE DETECTED 03/15/2015 1035   AMPHETMU NONE DETECTED 03/15/2015 1035   THCU NONE DETECTED 03/15/2015 1035   LABBARB POSITIVE (A) 03/15/2015 1035      IMAGING I have personally reviewed the radiological images below and agree with the radiology interpretations.  Dg Chest 2 View 10/13/2015 No acute  cardiopulmonary abnormality. Stable cardiomegaly. Aortic atherosclerosis.   Ct Head Wo Contrast 10/13/2015 1. No acute intracranial abnormalities. 2. Stable encephalomalacia involving the right temporal lobe. 3. 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 4. Similar appearance of left sphenoid wing meningioma with brain edema involving the left temporal lobe. 5. Stable left posterior fossa meningioma.   Ct Angio Head W Or Wo Contrast Ct Angio Neck W Or Wo Contrast 10/14/2015 1. Right proximal A2 occlusion. 2. Extensive intracranial atherosclerosis in the anterior and posterior circulation with multiple areas of stenosis greatest in the right mid M1 segment where it is moderate to severe. 3. Otherwise no evidence for intracranial large vessel occlusion, aneurysm, or vascular malformation. 4. Grossly stable left sphenoid wing meningioma and adjacent vasogenic edema in the brain in comparison with prior MRI given differences in technique. 5. Bilateral carotid bifurcation calcific atherosclerosis with mild 40%  left-sided proximal ICA stenosis. 6. Calcified plaque at bilateral vertebral artery origins without high-grade stenosis.   MRI 1. Acute posterior left MCA territory infarct, primarily affecting the superior left temporal gyrus and a portion of the anterior left occipital lobe. No associated acute hemorrhage. 2. Developing cytotoxic edema related to #1 superimposed on chronic vasogenic edema in the left temporal lobe related to the left sphenoid wing meningioma which appears grossly stable since March. No acute mass effect at this time. 3. Underlying chronic left middle MCA and posterior MCA ischemia. Superimposed chronic right MCA and right cerebellar infarcts.   EEG This awake and asleep EEG is abnormal due to the presence of: 1. Moderate diffuse slowing of the background 2. Additional focal slowing over the left frontotemporal region  TTE pending   PHYSICAL EXAM  Temp:  [97.6 F (36.4 C)-99 F (37.2 C)] 97.6 F (36.4 C) (10/11 0830) Pulse Rate:  [73-88] 88 (10/11 0830) Resp:  [17-20] 20 (10/11 0830) BP: (128-187)/(65-116) 136/68 (10/11 0830) SpO2:  [97 %-100 %] 98 % (10/11 0856) Weight:  [239 lb 3.2 oz (108.5 kg)] 239 lb 3.2 oz (108.5 kg) (10/10 2028)  General - Well nourished, well developed, sleepy and lethargic.  Ophthalmologic - Fundi not visualized due to noncooperation.  Cardiovascular - Regular rate and rhythm.  Neuro - sleepy and lethargic, able to open eyes on voice but not able to maintain eye opening. Not following commands and global aphasia, only able to say "no" and "please". Left eye preference, seems to have right neglect. Not blinking to visual threat bilaterally on forced eye opening. Facial symmetrical and PERRL. LUE against gravity without drift, RUE drift to bed slowly, LLE with paratonia. LLE able to hold on knee flexion position, but RLE not able to hold onto knee flexion position with gradual drift. b/l babinski positive. Sensation, coordination and  gait not able to test.    ASSESSMENT/PLAN Arthur Wood is a 82 y.o. male with history of atrial fibrillation on DAPT, seizures, HTN, renal cancer, prior stroke presenting with difficulty speaking. He did not receive IV t-PA due to delay in arrival.   Stroke:  left MCA infarct, secondary to afib not on AC vs. Large vessel disease.   Resultant  Global aphasia and right hemiparesis, but baseline not clear.  MRI  Left MCA infarct. Bilateral MCA encephalomalcia  CTA head and neck  R A2 occlusion. R M1 severe stenosis. Significant atherosclerosis.  2D Echo  pending  EEG - diffuse slowing with left frontotemporal focal slowing  LDL 71  HgbA1c pending  Lovenox 40 mg sq daily for VTE prophylaxis  Diet NPO time specified  clopidogrel 75 mg daily prior to admission, now on aspirin 300 mg suppository daily. Not sure the reason why pt was not on [facility], but currently he may not be a good candidate for [facility] due to advanced again, left MCA moderate sized acute infarct, as well as poor functional status.   Patient counseled to be compliant with his antithrombotic medications  Ongoing aggressive stroke risk factor management  Therapy recommendations:  pending   Disposition:  pending   Atrial Fibrillation  Home anticoagulation:  none   CHA2DS2-VASc Score = 6, ?2 oral anticoagulation recommended  Age in Years:  ?50   +2    Sex:  Male   0    Hypertension History:  yes   +1     Diabetes Mellitus:  0   Congestive Heart Failure History:  0  Vascular Disease History:  yes   +1     Stroke/TIA/Thromboembolism History:  yes   +2  Not sure the reason why pt was not on [facility], but currently he may not be a good candidate for [facility] due to advanced again, left MCA moderate sized acute infarct, as well as poor functional status.  ? Hx of stroke  Bilateral MCA encephalomalacia   However, also has bilateral hemicraniectomy as per CT bone window  Not sure about pt hx of neurosurgery ?   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  Left cerebellar small meningioma  Stable so far  Seizure disorder  EEG 03/2015 with left frontocentral slowing and epileptic discharges and sharp waves  On keppra and phenobarbital  Repeat EEG today showed only diffuse slow with left frontotemporal focal slowing    Hypertension  Stable  Permissive hypertension (OK if < 180/105) but gradually normalize in 5-7 days  Long-term BP goal normotensive  Hyperlipidemia  Home meds:  crestor 5  LDL 71, goal < 70  Resume crestor once po access  Continue statin at discharge  Other Stroke Risk Factors  Advanced age  Former Cigarette smoker  Obesity, Body mass index is 35.32 kg/m., recommend weight loss, diet and exercise as appropriate   Other Active Problems  Right greater than left lower extremity cellulitis  COPD  Hx L renal cancer  Hospital day # 0  Rosalin Hawking, MD PhD Stroke Neurology 10/14/2015 4:04 PM   To contact Stroke Continuity provider, please refer to [email or web address] After hours, contact General Neurology

## 2015-10-14 NOTE — Progress Notes (Signed)
PROGRESS NOTE    Arthur Wood  [id number] DOB: 1931-05-06 DOA: 10/13/2015 PCP: Kandice Hams, MD   Outpatient Specialists:     Brief Narrative:  Arthur Wood is a 82 y.o. male with medical history significant of hypertension, atrial fibrillation, prior stroke with residual numbness to the right side, and seizures who presents with AMS. Per son who provide history patient would not wake up in the morning, when he did get up, his speech was garbled. He didn't pay too much attention to that. Subsequently patient was less interactive, talkative and felt asleep at some point. He notice some changes and brought him for evaluation. He reported similar episode last year, and patient was thought to have a seizure. Per son report patient has been taking medications.    Assessment & Plan:   Active Problems:   Altered mental status   Hypertension   Asthma   Acute encephalopathy   Seizures ([place])   H/O: CVA (cerebrovascular accident)   Confusion   Confusion, less interactive, less talkative. Not responsive.  Differential broad, TIA, Vs stroke vs seizure . Unclear if CT finding with meningioma related to events.  Neurology consulted.  Continue with anti-seizure medications.  Unable to get MRI -doppler. ECHO On plavix.  Permissive HTN in case stroke.  Aspirin   LE cellulitis right worse than left.  duplex -much improved redness it appears without ever getting doxy-- will d/c Change lasix to IV  HTN;  Continue with nifedipine, hold  Clonidine, permissive HTN in setting stroke. Marland Kitchen  Hold lisinopril.   COPD;  Continue with salmeterol.    Seizure; continue with phenobarbital and keppra.  EEG done  Meningioma on CT; MRI ordered but unable to be done   DVT prophylaxis:  Lovenox   Code Status: Full Code  Family Communication: patient  Disposition Plan:     Consultants:   neuro  Procedures:        Subjective: Sleepy, moves to voice but not  following commands, did not speak  Objective: Vitals:   10/14/15 0030 10/14/15 0505 10/14/15 0830 10/14/15 0856  BP: (!) 145/70 131/67 136/68   Pulse: 74 88 88   Resp: 17 18 20    Temp: 98 F (36.7 C) 99 F (37.2 C) 97.6 F (36.4 C)   TempSrc: Oral Oral Oral   SpO2: 98% 100% 97% 98%  Weight:        Intake/Output Summary (Last 24 hours) at 10/14/15 1212 Last data filed at 10/14/15 0300  Gross per 24 hour  Intake              105 ml  Output              150 ml  Net              -45 ml   Filed Weights   10/13/15 2028  Weight: 108.5 kg (239 lb 3.2 oz)    Examination:  General exam: sleeping Respiratory system: diminished, no wheexzing Cardiovascular system: S1 & S2 heard, RRR. No JVD, murmurs, rubs, gallops or clicks. No pedal edema. Gastrointestinal system: Abdomen is nondistended, soft and nontender. No organomegaly or masses felt. Normal bowel sounds heard. Central nervous system: sleeping, appear to spontaneously move all 4 ext    Data Reviewed: I have personally reviewed following labs and imaging studies  CBC:  Recent Labs Lab 10/13/15 1259 10/13/15 1343  WBC  --  5.5  NEUTROABS  --  2.8  HGB 12.9* 12.7*  HCT 38.0* 38.4*  MCV  --  93.0  PLT  --  99991111   Basic Metabolic Panel:  Recent Labs Lab 10/13/15 1244 10/13/15 1259  NA 143 144  K 3.9 3.9  CL 108 105  CO2 29  --   GLUCOSE 121* 113*  BUN 17 21*  CREATININE 1.04 1.00  CALCIUM 8.7*  --    GFR: CrCl cannot be calculated (Unknown ideal weight.). Liver Function Tests:  Recent Labs Lab 10/13/15 1244  AST 17  ALT 13*  ALKPHOS 77  BILITOT 0.3  PROT 5.8*  ALBUMIN 3.4*   No results for input(s): LIPASE, AMYLASE in the last 168 hours. No results for input(s): AMMONIA in the last 168 hours. Coagulation Profile:  Recent Labs Lab 10/13/15 1244  INR 1.12   Cardiac Enzymes: No results for input(s): CKTOTAL, CKMB, CKMBINDEX, TROPONINI in the last 168 hours. BNP (last 3 results) No  results for input(s): PROBNP in the last 8760 hours. HbA1C: No results for input(s): HGBA1C in the last 72 hours. CBG: No results for input(s): GLUCAP in the last 168 hours. Lipid Profile:  Recent Labs  10/14/15 0532  CHOL 137  HDL 50  LDLCALC 71  TRIG 78  CHOLHDL 2.7   Thyroid Function Tests: No results for input(s): TSH, T4TOTAL, FREET4, T3FREE, THYROIDAB in the last 72 hours. Anemia Panel: No results for input(s): VITAMINB12, FOLATE, FERRITIN, TIBC, IRON, RETICCTPCT in the last 72 hours. Urine analysis:    Component Value Date/Time   COLORURINE YELLOW 10/13/2015 1628   APPEARANCEUR CLEAR 10/13/2015 1628   LABSPEC 1.020 10/13/2015 1628   PHURINE 6.0 10/13/2015 1628   GLUCOSEU NEGATIVE 10/13/2015 1628   HGBUR NEGATIVE 10/13/2015 1628   BILIRUBINUR NEGATIVE 10/13/2015 1628   KETONESUR NEGATIVE 10/13/2015 1628   PROTEINUR NEGATIVE 10/13/2015 1628   UROBILINOGEN 1.0 01/11/2010 0818   NITRITE NEGATIVE 10/13/2015 1628   LEUKOCYTESUR SMALL (A) 10/13/2015 1628     )No results found for this or any previous visit (from the past 240 hour(s)).    Anti-infectives    Start     Dose/Rate Route Frequency Ordered Stop   10/13/15 1800  doxycycline (VIBRA-TABS) tablet 100 mg     100 mg Oral Every 12 hours 10/13/15 1748         Radiology Studies: Ct Angio Head W Or Wo Contrast  Addendum Date: 10/14/2015   ADDENDUM REPORT: 10/14/2015 07:02 ADDENDUM: These results will be called to the ordering clinician or representative by the Radiologist Assistant, and communication documented in the PACS or zVision Dashboard. Electronically Signed   By: Kristine Garbe M.D.   On: 10/14/2015 07:02   Result Date: 10/14/2015 CLINICAL DATA:  82 y/o M; woke this morning with garbled speech history history of prior stroke with residual numbness on the right side and seizures. EXAM: CT ANGIOGRAPHY HEAD AND NECK TECHNIQUE: Multidetector CT imaging of the head and neck was performed using the  standard protocol during bolus administration of intravenous contrast. Multiplanar CT image reconstructions and MIPs were obtained to evaluate the vascular anatomy. Carotid stenosis measurements (when applicable) are obtained utilizing NASCET criteria, using the distal internal carotid diameter as the denominator. CONTRAST:  80 cc Isovue 370 COMPARISON:  10/13/2015 CT head.  03/15/2015 MRI head. FINDINGS: CTA NECK FINDINGS Aortic arch: Standard branching. Imaged portion shows no evidence of aneurysm or dissection. No significant stenosis of the major arch vessel origins. Right carotid system: No evidence of dissection, stenosis (50% or greater) or occlusion. Left carotid system: No evidence of dissection,  stenosis (50% or greater) or occlusion. Calcific atherosclerosis of carotid bifurcation with proximal left ICA mild stenosis of 40%. Vertebral arteries: Left dominant. Calcific atherosclerosis of bilateral origins without high-grade stenosis. No evidence for dissection or occlusion. Skeleton: Moderate spondylosis of the cervical spine without high-grade canal stenosis or foraminal narrowing. Other neck: Multiple subcentimeter thyroid nodules. No lymphadenopathy or discrete cervical mass is otherwise identified in the neck. The aerodigestive tract is patent. Postinflammatory calcifications of palatini tonsils. Paranasal sinuses and mastoid air cells are normally aerated. Upper chest: Platelike atelectasis along the left major fissure. Review of the MIP images confirms the above findings CTA HEAD FINDINGS Anterior circulation: Right proximal A2 occlusion (series 406, image 18). Extensive calcific atherosclerosis of internal carotid arteries and cavernous segment of terminus with mild to moderate underlying stenosis. Severe atherosclerotic irregularity of the bilateral M1 and proximal M2 segments with moderate to severe mid right M1 segment stenosis. Irregularity of the patent left anterior cerebral artery with  moderate stenosis of proximal A2 segment. Posterior circulation: Mild calcific atherosclerosis of bilateral V4 segments and luminal irregularity of the basilar artery and proximal posterior cerebral arteries with short segments of mild stenosis. No occlusion, aneurysm, or vascular malformation is identified. Venous sinuses: As permitted by contrast timing, patent. Anatomic variants: Right fetal posterior cerebral artery. No left posterior communicating artery identified, likely hypoplastic or absent. Left larger than right A1 segments and large anterior communicating artery. Delayed phase: Left sphenoid wing meningioma is grossly stable in comparison with the prior MRI. There is lucency in the adjacent left inferior frontal lobe probably representing associated vasogenic edema which is also stable. No new focus of abnormal enhancement of the brain parenchyma. Review of the MIP images confirms the above findings IMPRESSION: 1. Right proximal A2 occlusion. 2. Extensive intracranial atherosclerosis in the anterior and posterior circulation with multiple areas of stenosis greatest in the right mid M1 segment where it is moderate to severe. 3. Otherwise no evidence for intracranial large vessel occlusion, aneurysm, or vascular malformation. 4. Grossly stable left sphenoid wing meningioma and adjacent vasogenic edema in the brain in comparison with prior MRI given differences in technique. 5. Bilateral carotid bifurcation calcific atherosclerosis with mild 40% left-sided proximal ICA stenosis. 6. Calcified plaque at bilateral vertebral artery origins without high-grade stenosis. Electronically Signed: By: Kristine Garbe M.D. On: 10/14/2015 05:13   Dg Chest 2 View  Result Date: 10/13/2015 CLINICAL DATA:  Altered mental status. History of seizures, previous CVA, atrial fibrillation, asthma, former smoker, renal cell malignancy. EXAM: CHEST  2 VIEW COMPARISON:  Portable chest x-ray of March 15, 2015 FINDINGS:  The right lung is well-expanded and clear. On the left mild elevation of the hemidiaphragm is again observed. There is no acute infiltrate. There is no pleural effusion or pneumothorax. The cardiac silhouette is enlarged. The pulmonary vascularity is normal. There is calcification in the wall of the aortic arch. There is stable soft tissue fullness in the paratracheal regions that is likely vascular. The bony thorax exhibits no acute abnormality. IMPRESSION: No acute cardiopulmonary abnormality. Stable cardiomegaly. Aortic atherosclerosis. Electronically Signed   By: David  Martinique M.D.   On: 10/13/2015 15:31   Ct Head Wo Contrast  Result Date: 10/13/2015 CLINICAL DATA:  Difficulty with speech. EXAM: CT HEAD WITHOUT CONTRAST TECHNIQUE: Contiguous axial images were obtained from the base of the skull through the vertex without intravenous contrast. COMPARISON:  03/15/2015 FINDINGS: Brain: Large area of encephalomalacia is again noted involving the right temporal lobe. Similar appearance of low-attenuation  edema involving the left temporal lobe with overlying left sphenoid wing meningioma. Left parietal lobe encephalomalacia is again identified and appears unchanged, image 20 of series 201. CSF attenuation overlying the right frontal lobe is unchanged from previous exam measuring 11 mm in thickness, image 25 of series 201. 8 mm meningioma overlying the left cerebellar hemisphere is unchanged, image 5 of series 201. No evidence for acute brain infarct. No intracranial hemorrhage identified. Vascular: No hyperdense vessel or unexpected calcification. Skull: Previous right parietal craniotomy and left frontal craniotomy. Sinuses/Orbits: No acute finding. Other: None. IMPRESSION: 1. No acute intracranial abnormalities. 2. Stable encephalomalacia involving the right temporal lobe. 3. 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 4. Similar appearance of left sphenoid wing meningioma with brain edema  involving the left temporal lobe. 5. Stable left posterior fossa meningioma. Electronically Signed   By: Kerby Moors M.D.   On: 10/13/2015 13:21   Ct Angio Neck W Or Wo Contrast  Addendum Date: 10/14/2015   ADDENDUM REPORT: 10/14/2015 07:02 ADDENDUM: These results will be called to the ordering clinician or representative by the Radiologist Assistant, and communication documented in the PACS or zVision Dashboard. Electronically Signed   By: Kristine Garbe M.D.   On: 10/14/2015 07:02   Result Date: 10/14/2015 CLINICAL DATA:  82 y/o M; woke this morning with garbled speech history history of prior stroke with residual numbness on the right side and seizures. EXAM: CT ANGIOGRAPHY HEAD AND NECK TECHNIQUE: Multidetector CT imaging of the head and neck was performed using the standard protocol during bolus administration of intravenous contrast. Multiplanar CT image reconstructions and MIPs were obtained to evaluate the vascular anatomy. Carotid stenosis measurements (when applicable) are obtained utilizing NASCET criteria, using the distal internal carotid diameter as the denominator. CONTRAST:  80 cc Isovue 370 COMPARISON:  10/13/2015 CT head.  03/15/2015 MRI head. FINDINGS: CTA NECK FINDINGS Aortic arch: Standard branching. Imaged portion shows no evidence of aneurysm or dissection. No significant stenosis of the major arch vessel origins. Right carotid system: No evidence of dissection, stenosis (50% or greater) or occlusion. Left carotid system: No evidence of dissection, stenosis (50% or greater) or occlusion. Calcific atherosclerosis of carotid bifurcation with proximal left ICA mild stenosis of 40%. Vertebral arteries: Left dominant. Calcific atherosclerosis of bilateral origins without high-grade stenosis. No evidence for dissection or occlusion. Skeleton: Moderate spondylosis of the cervical spine without high-grade canal stenosis or foraminal narrowing. Other neck: Multiple subcentimeter  thyroid nodules. No lymphadenopathy or discrete cervical mass is otherwise identified in the neck. The aerodigestive tract is patent. Postinflammatory calcifications of palatini tonsils. Paranasal sinuses and mastoid air cells are normally aerated. Upper chest: Platelike atelectasis along the left major fissure. Review of the MIP images confirms the above findings CTA HEAD FINDINGS Anterior circulation: Right proximal A2 occlusion (series 406, image 18). Extensive calcific atherosclerosis of internal carotid arteries and cavernous segment of terminus with mild to moderate underlying stenosis. Severe atherosclerotic irregularity of the bilateral M1 and proximal M2 segments with moderate to severe mid right M1 segment stenosis. Irregularity of the patent left anterior cerebral artery with moderate stenosis of proximal A2 segment. Posterior circulation: Mild calcific atherosclerosis of bilateral V4 segments and luminal irregularity of the basilar artery and proximal posterior cerebral arteries with short segments of mild stenosis. No occlusion, aneurysm, or vascular malformation is identified. Venous sinuses: As permitted by contrast timing, patent. Anatomic variants: Right fetal posterior cerebral artery. No left posterior communicating artery identified, likely hypoplastic or absent. Left larger  than right A1 segments and large anterior communicating artery. Delayed phase: Left sphenoid wing meningioma is grossly stable in comparison with the prior MRI. There is lucency in the adjacent left inferior frontal lobe probably representing associated vasogenic edema which is also stable. No new focus of abnormal enhancement of the brain parenchyma. Review of the MIP images confirms the above findings IMPRESSION: 1. Right proximal A2 occlusion. 2. Extensive intracranial atherosclerosis in the anterior and posterior circulation with multiple areas of stenosis greatest in the right mid M1 segment where it is moderate to  severe. 3. Otherwise no evidence for intracranial large vessel occlusion, aneurysm, or vascular malformation. 4. Grossly stable left sphenoid wing meningioma and adjacent vasogenic edema in the brain in comparison with prior MRI given differences in technique. 5. Bilateral carotid bifurcation calcific atherosclerosis with mild 40% left-sided proximal ICA stenosis. 6. Calcified plaque at bilateral vertebral artery origins without high-grade stenosis. Electronically Signed: By: Kristine Garbe M.D. On: 10/14/2015 05:13        Scheduled Meds: . arformoterol  15 mcg Nebulization BID  . aspirin  300 mg Rectal Daily  . budesonide  0.25 mg Inhalation BID  . clopidogrel  75 mg Oral Daily  . doxycycline  100 mg Oral Q12H  . enoxaparin (LOVENOX) injection  40 mg Subcutaneous Q24H  . furosemide  20 mg Oral Daily  . levETIRAcetam  500 mg Intravenous Q12H  . multivitamin with minerals  1 tablet Oral Daily  . NIFEdipine  60 mg Oral Daily  . PHENObarbital  90 mg Intravenous QHS  . rosuvastatin  5 mg Oral QPM   Continuous Infusions:    LOS: 0 days    Time spent: 25 min    [place], DO [facility] Pager [phone]  If 7PM-7AM, please contact night-coverage [email or web address] Password [email or web address] 10/14/2015, 12:12 PM

## 2015-10-15 ENCOUNTER — Encounter (HOSPITAL_COMMUNITY): Payer: Self-pay | Admitting: Physical Medicine and Rehabilitation

## 2015-10-15 ENCOUNTER — Inpatient Hospital Stay (HOSPITAL_COMMUNITY): Payer: Medicare Other

## 2015-10-15 DIAGNOSIS — G8191 Hemiplegia, unspecified affecting right dominant side: Secondary | ICD-10-CM

## 2015-10-15 DIAGNOSIS — R269 Unspecified abnormalities of gait and mobility: Secondary | ICD-10-CM

## 2015-10-15 DIAGNOSIS — R4182 Altered mental status, unspecified: Secondary | ICD-10-CM

## 2015-10-15 DIAGNOSIS — I6789 Other cerebrovascular disease: Secondary | ICD-10-CM

## 2015-10-15 DIAGNOSIS — I69320 Aphasia following cerebral infarction: Secondary | ICD-10-CM

## 2015-10-15 DIAGNOSIS — I480 Paroxysmal atrial fibrillation: Secondary | ICD-10-CM

## 2015-10-15 LAB — BASIC METABOLIC PANEL
Anion gap: 8 (ref 5–15)
BUN: 12 mg/dL (ref 4–21)
BUN: 12 mg/dL (ref 6–20)
CO2: 26 mmol/L (ref 22–32)
CREATININE: 1 mg/dL (ref 0.6–1.3)
CREATININE: 0.98 mg/dL (ref 0.61–1.24)
Calcium: 8.4 mg/dL — ABNORMAL LOW (ref 8.9–10.3)
Chloride: 105 mmol/L (ref 101–111)
GFR calc Af Amer: >60 mL/min (ref >60)
GLUCOSE: 108 mg/dL — AB (ref 65–99)
Glucose: 108 mg/dL
Potassium: 3.8 mmol/L (ref 3.5–5.1)
SODIUM: 139 mmol/L (ref 137–147)
SODIUM: 139 mmol/L (ref 135–145)

## 2015-10-15 LAB — CBC AND DIFFERENTIAL: WBC: 9 10*3/mL

## 2015-10-15 LAB — CBC
HCT: 40.9 % (ref 39.0–52.0)
Hemoglobin: 13.6 g/dL (ref 13.0–17.0)
MCH: 30.5 pg (ref 26.0–34.0)
MCHC: 33.3 g/dL (ref 30.0–36.0)
MCV: 91.7 fL (ref 78.0–100.0)
PLATELETS: 175 10*3/uL (ref 150–400)
RBC: 4.46 MIL/uL (ref 4.22–5.81)
RDW: 13.8 % (ref 11.5–15.5)
WBC: 9 10*3/uL (ref 4.0–10.5)

## 2015-10-15 LAB — HEMOGLOBIN A1C
HEMOGLOBIN A1C: 5.3 % (ref 4.8–5.6)
Mean Plasma Glucose: 105 mg/dL

## 2015-10-15 LAB — GLUCOSE, CAPILLARY: GLUCOSE-CAPILLARY: 105 mg/dL — AB (ref 65–99)

## 2015-10-15 LAB — ECHOCARDIOGRAM COMPLETE: Weight: 3827.19 oz

## 2015-10-15 NOTE — NC FL2 (Signed)
[place] LEVEL OF CARE SCREENING TOOL     IDENTIFICATION  Patient Name: Arthur Wood Birthdate: 17-Feb-1931 Sex: male Admission Date (Current Location): 10/13/2015  [facility] and [place] Number:  Herbalist and Address:  [facility], [place] [address], [place], [place] [address]      Provider Number: [id number]  Attending Physician Name and Address:  Geradine Girt, DO  Relative Name and Phone Number:       Current Level of Care: Hospital Recommended Level of Care: [place] Prior Approval Number:    Date Approved/Denied:   PASRR Number:  ([id number])  Discharge Plan: SNF    Current Diagnoses: Patient Active Problem List   Diagnosis Date Noted  . Abnormality of gait and mobility   . Aphasia due to recent stroke   . Right hemiparesis ([place])   . Acute CVA (cerebrovascular accident) ([place]) 10/14/2015  . Stroke ([place])   . Meningioma ([place])   . Confusion 10/13/2015  . H/O: CVA (cerebrovascular accident) 04/08/2015  . Seizures ([place]) 03/28/2015  . Hypertensive emergency 03/28/2015  . Hyperlipidemia 03/28/2015  . Altered mental status 03/15/2015  . Atrial fibrillation ([place]) 03/15/2015  . Hypertension 03/15/2015  . Asthma 03/15/2015  . Acute encephalopathy 03/15/2015  . Renal mass, left     Orientation RESPIRATION BLADDER Height & Weight     Self, Time, Situation, Place  Normal Continent Weight: 239 lb 3.2 oz (108.5 kg) Height:     BEHAVIORAL SYMPTOMS/MOOD NEUROLOGICAL BOWEL NUTRITION STATUS    Convulsions/Seizures Continent Diet (DYS 1)  AMBULATORY STATUS COMMUNICATION OF NEEDS Skin   Extensive Assist Verbally Other (Comment) (LE cellulitis)                       Personal Care Assistance Level of Assistance  Bathing, Feeding, Dressing Bathing Assistance: Maximum assistance Feeding assistance: Limited assistance Dressing Assistance: Maximum assistance     Functional Limitations Info  Sight,  Hearing, Speech Sight Info: Impaired Hearing Info: Adequate Speech Info: Adequate    SPECIAL CARE FACTORS FREQUENCY  PT (By licensed PT), OT (By licensed OT)                    Contractures Contractures Info: Not present    Additional Factors Info  Allergies Code Status Info:  (full code) Allergies Info:  (NKDA)           Current Medications (10/15/2015):  This is the current hospital active medication list Current Facility-Administered Medications  Medication Dose Route Frequency Provider Last Rate Last Dose  . albuterol (PROVENTIL) (2.5 MG/3ML) 0.083% nebulizer solution 3 mL  3 mL Inhalation Q4H PRN Belkys A Regalado, MD      . arformoterol (BROVANA) nebulizer solution 15 mcg  15 mcg Nebulization BID Belkys A Regalado, MD   15 mcg at 10/15/15 0753  . aspirin suppository 300 mg  300 mg Rectal Daily Belkys A Regalado, MD   300 mg at 10/15/15 0931  . budesonide (PULMICORT) nebulizer solution 0.25 mg  0.25 mg Inhalation BID Belkys A Regalado, MD   0.25 mg at 10/15/15 0753  . clopidogrel (PLAVIX) tablet 75 mg  75 mg Oral Daily Belkys A Regalado, MD   75 mg at 10/15/15 0930  . enoxaparin (LOVENOX) injection 40 mg  40 mg Subcutaneous Q24H Belkys A Regalado, MD   40 mg at 10/14/15 2157  . furosemide (LASIX) injection 20 mg  20 mg Intravenous Daily Janett Billow U  Vann, DO   20 mg at 10/15/15 0931  . levETIRAcetam (KEPPRA) 500 mg in sodium chloride 0.9 % 100 mL IVPB  500 mg Intravenous Q12H Belkys A Regalado, MD   500 mg at 10/15/15 0818  . LORazepam (ATIVAN) injection 1 mg  1 mg Intravenous Once PRN Rosalin Hawking, MD      . multivitamin with minerals tablet 1 tablet  1 tablet Oral Daily Belkys A Regalado, MD   1 tablet at 10/15/15 0930  . NIFEdipine (PROCARDIA-XL/ADALAT CC) 24 hr tablet 60 mg  60 mg Oral Daily Belkys A Regalado, MD   60 mg at 10/15/15 0930  . PHENObarbital (LUMINAL) injection 90 mg  90 mg Intravenous QHS Belkys A Regalado, MD   90 mg at 10/14/15 2157  . rosuvastatin  (CRESTOR) tablet 5 mg  5 mg Oral QPM Belkys A Regalado, MD   5 mg at 10/15/15 1804  . senna-docusate (Senokot-S) tablet 1 tablet  1 tablet Oral QHS PRN Elmarie Shiley, MD         Discharge Medications: Please see discharge summary for a list of discharge medications.  Relevant Imaging Results:  Relevant Lab Results:   Additional Information    Lyncoln Ledgerwood M, LCSW

## 2015-10-15 NOTE — Progress Notes (Signed)
  Echocardiogram 2D Echocardiogram has been performed.  Arthur Wood 10/15/2015, 3:28 PM

## 2015-10-15 NOTE — Clinical Social Work Note (Signed)
Clinical Social Work Assessment  Patient Details  Name: Arthur Wood MRN: [id number] Date of Birth: 1931-09-09  Date of referral:  10/15/15               Reason for consult:  Facility Placement                Permission sought to share information with:  Family Supports Permission granted to share information::  Yes, Verbal Permission Granted  Name::     Graciano Hogland  Relationship::  son  Contact Information:  [phone]  Housing/Transportation Living arrangements for the past 2 months:  Apartment Source of Information:  Adult Children Patient Interpreter Needed:  None Criminal Activity/Legal Involvement Pertinent to Current Situation/Hospitalization:  No - Comment as needed Significant Relationships:  Adult Children Lives with:  Adult Children Do you feel safe going back to the place where you live?  No (Pt needs a higher level of care.) Need for family participation in patient care:  Yes (Comment)  Care giving concerns:  No care giving concerns identified.   Social Worker assessment / plan:  CSW spoke with pt's son, Arthur Wood, to address consult. CSW introduced herself and explained role of social work. CSW also explained the process of discharging to SNF. Pt's son shared that pt has been to [facility] in the past, and he would like for pt to return if possible. It is the facility closest to pt's home and his disabled son is able to get transportation to facility rather easily. Arthur Wood shared that pt's wife, who is now deceased, was the primary care giver for pt and son. Arthur Wood lives in [place] and will be flying in tomorrow to assist in discharge planning needs and assisting his brother. Pt's son, Arthur Wood, has limitations. He does not have insurance or an income and has many needs. Arthur Wood asked many questions regarding his brother, including an [employer]. CSW referred Arthur Wood to [place] and [employer]. CSW initiatied SNF search and will follow up with bed  offers. CSW will continue to follow.   Employment status:   Retired Health visitor PT Recommendations:  Inpatient [place] / Referral to community resources:  [place], Other (Comment Required) (DSS)  Patient/Family's Response to care:  Arthur Wood was appreciative of CSW support.   Patient/Family's Understanding of and Emotional Response to Diagnosis, Current Treatment, and Prognosis:  Pt's son, Arthur Wood, understands that pt will need rehab prior to returning home.   Emotional Assessment Appearance:  Other (Comment Required Attitude/Demeanor/Rapport:  Other Affect (typically observed):  Other Orientation:  Fluctuating Orientation (Suspected and/or reported [place]) Alcohol / Substance use:  Never Used Psych involvement (Current and /or in the community):  No (Comment)  Discharge Needs  Concerns to be addressed:  Adjustment to Illness Readmission within the last 30 days:  No Current discharge risk:  Chronically ill Barriers to Discharge:  Continued Medical Work up   Darden Dates, LCSW 10/15/2015, 5:53 PM

## 2015-10-15 NOTE — Evaluation (Signed)
Occupational Therapy Evaluation Patient Details Name: Arthur Wood MRN: [id number] DOB: [birth year] Today's Date: 10/15/2015    History of Present Illness 82 y.o.malewith medical history significant of hypertension, atrial fibrillation, prior stroke with residual numbness to the right side, and seizures who presents with AMS. CT on 10/10 showed no acute intracranial abnormalities. MRI shows acute posterior L MCA territory infarct.  Pt also with hx of meningioma.   Clinical Impression   Pt admitted with the above diagnoses and presents with below problem list. Pt will benefit from continued acute OT to address the below listed deficits and maximize independence with basic ADLs prior to d/c to venue below. PTA pt was mostly mod I to supervision with basic ADLs except for assistance needed with bathing per son whom he lives with.  Pt now max +2 A for OOB/LB ADLs and max A for UB ADLs. Session details below.      Follow Up Recommendations  CIR;Supervision/Assistance - 24 hour    Equipment Recommendations  Other (comment) (defer to next venue)    Recommendations for Other Services Rehab consult     Precautions / Restrictions Precautions Precautions: Fall Precaution Comments: pt with significant LE and scrotal edema Restrictions Weight Bearing Restrictions: No      Mobility Bed Mobility Overal bed mobility: Needs Assistance;+2 for physical assistance Bed Mobility: Rolling;Sidelying to Sit;Sit to Supine Rolling: Max assist;+2 for physical assistance Sidelying to sit: Max assist;+2 for physical assistance   Sit to supine: Total assist;+2 for physical assistance   General bed mobility comments: Rolled to L with MAX of 2 and then to sitting with MAX of 2 with pt showing effort to come to sitting EOB. Returning to supine, pt needed TOT A due to resistance  Transfers Overall transfer level: Needs assistance Equipment used: Rolling walker (2 wheeled) Transfers: Sit to/from  Stand Sit to Stand: Mod assist;Max assist;+2 physical assistance;From elevated surface         General transfer comment: Stood from elevated surface with MAX of 2 and pt able to wrap R hand around grip.  2nd stand, pt stood with MOD A of 2.    Balance Overall balance assessment: Needs assistance Sitting-balance support: Feet supported;Single extremity supported;No upper extremity supported Sitting balance-Leahy Scale: Poor Sitting balance - Comments: Slight R lean.  Postural control: Right lateral lean Standing balance support: Bilateral upper extremity supported Standing balance-Leahy Scale: Zero Standing balance comment: heavy lean to the R                            ADL Overall ADL's : Needs assistance/impaired Eating/Feeding: Maximal assistance;Sitting   Grooming: Maximal assistance;Sitting   Upper Body Bathing: Maximal assistance;Sitting   Lower Body Bathing: Maximal assistance;+2 for physical assistance   Upper Body Dressing : Maximal assistance;Sitting   Lower Body Dressing: Maximal assistance;+2 for physical assistance;Sit to/from Health and safety inspector Details (indicate cue type and reason): Pt able to stand and sidestep with +2 assist. Recommend steady for SPT.  Toileting- Clothing Manipulation and Hygiene: Maximal assistance;+2 for physical assistance     Tub/Shower Transfer Details (indicate cue type and reason): Pt able to stand and sidestep with +2 assist. Recommend steady for SPT. Functional mobility during ADLs:  (not attempted due to zero standing balance) General ADL Comments: Pt completed sit<>stand 2x from EOB. Sat several minutes on first attempt, initially min A improving min guard with R lean. Pt attempting to stand multiple times  during session, impulsive. Several sidesteps towards left side with +2 assist and noted right lean.     Vision Additional Comments: difficult to asses due to impaired communication and cognition.    Perception     Praxis      Pertinent Vitals/Pain Pain Assessment: Faces Faces Pain Scale: Hurts even more Pain Location: grimacing with LLE movement Pain Descriptors / Indicators: Grimacing Pain Intervention(s): Limited activity within patient's tolerance;Monitored during session;Repositioned     Hand Dominance Right   Extremity/Trunk Assessment Upper Extremity Assessment Upper Extremity Assessment: RUE deficits/detail;LUE deficits/detail;Difficult to assess due to impaired cognition RUE Deficits / Details: per son pt has baseline residual weakness in RUE, "liked to cradle it." Pt with noted spasticity in RUE. With time and effort able to open hand to place on rw. RUE Coordination: decreased fine motor;decreased gross motor LUE Deficits / Details: generalized weakness   Lower Extremity Assessment Lower Extremity Assessment: Defer to PT evaluation       Communication Communication Communication: Receptive difficulties;Expressive difficulties   Cognition Arousal/Alertness: Lethargic Behavior During Therapy: Impulsive Overall Cognitive Status: Difficult to assess Area of Impairment: Awareness;Safety/judgement;Problem solving         Safety/Judgement: Decreased awareness of safety;Decreased awareness of deficits Awareness: Intellectual Problem Solving: Slow processing;Requires tactile cues General Comments: aphasia   General Comments       Exercises       Shoulder Instructions      Home Living Family/patient expects to be discharged to:: Private residence Living Arrangements: Children Available Help at Discharge: Family Type of Home: House       Home Layout: One level     Bathroom Shower/Tub: Tub/shower unit         Home Equipment: Environmental consultant - 2 wheels;Grab bars - toilet;Grab bars - tub/shower   Additional Comments: Home setup and PLOF per son that he lives with.       Prior Functioning/Environment Level of Independence: Independent with assistive  device(s);Needs assistance  Gait / Transfers Assistance Needed: son reports uses rw ADL's / Homemaking Assistance Needed: Son reports pt needed assist with bathing, could dress himself. Son prepares meals. Communication / Swallowing Assistance Needed: dysarthric from previous stroke Comments: Pt's son reports pt had some baseline residual weakness on right side, "he liked to cradle that arm across his body." Pt's son is mentally disabled per chart review.         OT Problem List: Decreased activity tolerance;Decreased strength;Decreased range of motion;Impaired balance (sitting and/or standing);Decreased coordination;Decreased cognition;Decreased safety awareness;Decreased knowledge of precautions;Decreased knowledge of use of DME or AE;Impaired UE functional use;Pain;Increased edema   OT Treatment/Interventions: Self-care/ADL training;Neuromuscular education;Therapeutic exercise;DME and/or AE instruction;Therapeutic activities;Cognitive remediation/compensation;Patient/family education;Balance training    OT Goals(Current goals can be found in the care plan section) Acute Rehab OT Goals Patient Stated Goal: none stated Potential to Achieve Goals: Good ADL Goals Pt Will Perform Eating: sitting;bed level;with mod assist;with adaptive utensils Pt Will Perform Upper Body Bathing: with mod assist;sitting Pt Will Perform Lower Body Bathing: with mod assist;sit to/from stand (+2) Pt Will Transfer to Toilet: with max assist;with +2 assist;stand pivot transfer;bedside commode Additional ADL Goal #1: Pt will complete bed mobility at max A +1 level to prepare for OOB ADLs. Additional ADL Goal #2: Pt will sit EOB at minguard level for 5 minutes to complete simple ADL task.  OT Frequency: Min 3X/week   Barriers to D/C:            Co-evaluation PT/OT/SLP Co-Evaluation/Treatment: Yes Reason for Co-Treatment: For patient/therapist  safety PT goals addressed during session: Mobility/safety with  mobility OT goals addressed during session: ADL's and self-care      End of Session Equipment Utilized During Treatment: Gait belt;Rolling walker;Oxygen  Activity Tolerance: Patient tolerated treatment well;Patient limited by pain;Patient limited by lethargy Patient left: in bed;with call bell/phone within reach;with bed alarm set   Time: [phone] OT Time Calculation (min): 33 min Charges:  OT General Charges $OT Visit: 1 Procedure OT Evaluation $OT Eval Moderate Complexity: 1 Procedure G-Codes:    Hortencia Pilar November 07, 2015, 1:36 PM

## 2015-10-15 NOTE — Evaluation (Addendum)
Physical Therapy Evaluation Patient Details Name: Arthur Wood MRN: [id number] DOB: 09/14/1931 Today's Date: 10/15/2015   History of Present Illness  82 y.o.malewith medical history significant of hypertension, atrial fibrillation, prior stroke with residual numbness to the right side, and seizures who presents with AMS. CT on 10/10 showed no acute intracranial abnormalities. MRI shows acute posterior L MCA territory infarct.  Pt also with hx of meningioma.  Clinical Impression  Pt admitted with above diagnosis. Pt currently with functional limitations due to the deficits listed below (see PT Problem List).  Pt will benefit from skilled PT to increase their independence and safety with mobility to allow discharge to the venue listed below.  Pt lethargic initially, but gave forth good effort and able to stand with MAX of 2 one episode and MOD of 2 another time with side steps with RW to [facility].  Recommend CIR consult as pt was ambulatory at home prior to CVA.     Follow Up Recommendations CIR;Supervision for mobility/OOB    Equipment Recommendations  None recommended by PT    Recommendations for Other Services Rehab consult     Precautions / Restrictions Precautions Precautions: Fall Restrictions Weight Bearing Restrictions: No      Mobility  Bed Mobility Overal bed mobility: Needs Assistance;+2 for physical assistance Bed Mobility: Rolling;Sidelying to Sit;Sit to Supine Rolling: Max assist;+2 for physical assistance Sidelying to sit: Max assist;+2 for physical assistance   Sit to supine: Total assist;+2 for physical assistance   General bed mobility comments: Rolled to L with MAX of 2 and then to sitting with MAX of 2 with pt showing effort to come to sitting EOB. Returning to supine, pt needed TOT A due to resistance  Transfers Overall transfer level: Needs assistance Equipment used: Rolling walker (2 wheeled) Transfers: Sit to/from Stand Sit to Stand: Mod assist;Max  assist;+2 physical assistance;From elevated surface         General transfer comment: Stood from elevated surface with MAX of 2 and pt able to wrap R hand around grip.  2nd stand, pt stood with MOD A of 2.  Ambulation/Gait Ambulation/Gait assistance: Max assist;+2 physical assistance Ambulation Distance (Feet): 2 Feet   Gait Pattern/deviations: Decreased step length - right;Decreased step length - left     General Gait Details: Side stepping to L with heavy lean to the R and took increased time to move 2 feet up towards [facility] with multi-modal cueing.  Stairs            Wheelchair Mobility    Modified Rankin (Stroke Patients Only) Modified Rankin (Stroke Patients Only) Pre-Morbid Rankin Score: Moderate disability Modified Rankin: Severe disability     Balance Overall balance assessment: Needs assistance Sitting-balance support: Feet supported Sitting balance-Leahy Scale: Poor Sitting balance - Comments: Slight R lean.  Postural control: Right lateral lean Standing balance support: Bilateral upper extremity supported Standing balance-Leahy Scale: Zero Standing balance comment: heavy lean to the R                             Pertinent Vitals/Pain Pain Assessment: Faces Faces Pain Scale: Hurts even more Pain Location: grimacing with L LE movement Pain Descriptors / Indicators: Grimacing Pain Intervention(s): Limited activity within patient's tolerance;Repositioned    Home Living Family/patient expects to be discharged to:: Private residence Living Arrangements: Children Available Help at Discharge: Family Type of Home: House       Home Layout: One level Home Equipment: Environmental consultant -  2 wheels      Prior Function Level of Independence: Independent with assistive device(s)               Hand Dominance        Extremity/Trunk Assessment   Upper Extremity Assessment: Defer to OT evaluation           Lower Extremity Assessment: Generalized  weakness (unable to get pt to perform MMTs, grimacing with L LE ROM)         Communication   Communication: Receptive difficulties;Expressive difficulties  Cognition Arousal/Alertness: Lethargic Behavior During Therapy: Impulsive Overall Cognitive Status: No family/caregiver present to determine baseline cognitive functioning Area of Impairment: Awareness;Safety/judgement;Problem solving         Safety/Judgement: Decreased awareness of safety;Decreased awareness of deficits Awareness: Intellectual Problem Solving: Slow processing;Requires tactile cues General Comments: aphasia    General Comments General comments (skin integrity, edema, etc.): pt with LE and scrotal edema    Exercises     Assessment/Plan    PT Assessment Patient needs continued PT services  PT Problem List Decreased strength;Decreased range of motion;Decreased activity tolerance;Decreased balance;Decreased mobility;Decreased coordination;Decreased safety awareness          PT Treatment Interventions Gait training;Functional mobility training;Therapeutic activities;Therapeutic exercise;Balance training;Neuromuscular re-education    PT Goals (Current goals can be found in the Care Plan section)  Acute Rehab PT Goals Patient Stated Goal: none stated PT Goal Formulation: Patient unable to participate in goal setting Time For Goal Achievement: 10/29/15 Potential to Achieve Goals: Good    Frequency Min 4X/week   Barriers to discharge        Co-evaluation PT/OT/SLP Co-Evaluation/Treatment: Yes Reason for Co-Treatment: For patient/therapist safety PT goals addressed during session: Mobility/safety with mobility         End of Session Equipment Utilized During Treatment: Gait belt Activity Tolerance: Patient tolerated treatment well;Patient limited by lethargy Patient left: in bed;with call bell/phone within reach;with bed alarm set Nurse Communication: Mobility status;Need for lift equipment  (recommend [facility] for nursing to get pt OOB)         Time: [id number] PT Time Calculation (min) (ACUTE ONLY): 33 min   Charges:   PT Evaluation $PT Eval Moderate Complexity: 1 Procedure     PT G Codes:        Lolah Coghlan LUBECK 10/15/2015, 10:28 AM

## 2015-10-15 NOTE — Clinical Social Work Placement (Signed)
   CLINICAL SOCIAL WORK PLACEMENT  NOTE  Date:  10/15/2015  Patient Details  Name: Arthur Wood MRN: [id number] Date of Birth: [birth year]  Clinical Social Work is seeking post-discharge placement for this patient at the [facility] level of care (*CSW will initial, date and re-position this form in  chart as items are completed):  Yes   Patient/family provided with [facility] Work Department's list of facilities offering this level of care within the geographic area requested by the patient (or if unable, by the patient's family).  Yes   Patient/family informed of their freedom to choose among providers that offer the needed level of care, that participate in Medicare, Medicaid or managed care program needed by the patient, have an available bed and are willing to accept the patient.  Yes   Patient/family informed of [facility]'s ownership interest in [facility] and [facility], as well as of the fact that they are under no obligation to receive care at these facilities.  PASRR submitted to EDS on       PASRR number received on       Existing PASRR number confirmed on 10/15/15     FL2 transmitted to all facilities in geographic area requested by pt/family on 10/15/15     FL2 transmitted to all facilities within larger geographic area on       Patient informed that his/her managed care company has contracts with or will negotiate with certain facilities, including the following:            Patient/family informed of bed offers received.  Patient chooses bed at       Physician recommends and patient chooses bed at      Patient to be transferred to   on  .  Patient to be transferred to facility by       Patient family notified on   of transfer.  Name of family member notified:        PHYSICIAN       Additional Comment:    _______________________________________________ Roanna Raider, LCSW 10/15/2015, 6:44 PM

## 2015-10-15 NOTE — Progress Notes (Signed)
Rehab Admissions Coordinator Note:  Patient was screened by Retta Diones for appropriateness for an Inpatient Acute Rehab Consult.  At this time, we are recommending Inpatient Rehab consult.  Retta Diones 10/15/2015, 11:42 AM  I can be reached at [phone].

## 2015-10-15 NOTE — Progress Notes (Signed)
STROKE TEAM PROGRESS NOTE   SUBJECTIVE (INTERVAL HISTORY) Son is at bedside. He stated that pt wife passed away early this year and he started to taking care of pt after. He is not completely aware all pt history but he stated that pt had multiple strokes in the past and one stroke seems like large hemorrhagic stroke s/p surgical evacuation. He knows pt has afib but not aware why he is not on [facility] He stated that pt at home baseline able to walk with walker, but slow, able to feed himself, dress himself. Need help for shower. Baseline dysarthria.     OBJECTIVE Temp:  [97.6 F (36.4 C)-98.9 F (37.2 C)] 98.3 F (36.8 C) (10/12 2021) Pulse Rate:  [82-103] 103 (10/12 2021) Cardiac Rhythm: Atrial fibrillation;Bundle branch block (10/12 1900) Resp:  [16-20] 20 (10/12 2021) BP: (141-178)/(71-89) 164/89 (10/12 2021) SpO2:  [96 %-99 %] 96 % (10/12 2021)  CBC:   Recent Labs Lab 10/13/15 1343 10/15/15 0604  WBC 5.5 9.0  NEUTROABS 2.8  --   HGB 12.7* 13.6  HCT 38.4* 40.9  MCV 93.0 91.7  PLT 155 0000000    Basic Metabolic Panel:   Recent Labs Lab 10/13/15 1244 10/13/15 1259 10/15/15 0604  NA 143 144 139  K 3.9 3.9 3.8  CL 108 105 105  CO2 29  --  26  GLUCOSE 121* 113* 108*  BUN 17 21* 12  CREATININE 1.04 1.00 0.98  CALCIUM 8.7*  --  8.4*    Lipid Panel:     Component Value Date/Time   CHOL 137 10/14/2015 0532   TRIG 78 10/14/2015 0532   HDL 50 10/14/2015 0532   CHOLHDL 2.7 10/14/2015 0532   VLDL 16 10/14/2015 0532   LDLCALC 71 10/14/2015 0532   HgbA1c:  Lab Results  Component Value Date   HGBA1C 5.3 10/14/2015   Urine Drug Screen:     Component Value Date/Time   LABOPIA NONE DETECTED 03/15/2015 1035   COCAINSCRNUR NONE DETECTED 03/15/2015 1035   LABBENZ NONE DETECTED 03/15/2015 1035   AMPHETMU NONE DETECTED 03/15/2015 1035   THCU NONE DETECTED 03/15/2015 1035   LABBARB POSITIVE (A) 03/15/2015 1035      IMAGING I have personally reviewed the radiological  images below and agree with the radiology interpretations.  Dg Chest 2 View 10/13/2015 No acute cardiopulmonary abnormality. Stable cardiomegaly. Aortic atherosclerosis.   Ct Head Wo Contrast 10/13/2015 1. No acute intracranial abnormalities. 2. Stable encephalomalacia involving the right temporal lobe. 3. 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 4. Similar appearance of left sphenoid wing meningioma with brain edema involving the left temporal lobe. 5. Stable left posterior fossa meningioma.   Ct Angio Head W Or Wo Contrast Ct Angio Neck W Or Wo Contrast 10/14/2015 1. Right proximal A2 occlusion. 2. Extensive intracranial atherosclerosis in the anterior and posterior circulation with multiple areas of stenosis greatest in the right mid M1 segment where it is moderate to severe. 3. Otherwise no evidence for intracranial large vessel occlusion, aneurysm, or vascular malformation. 4. Grossly stable left sphenoid wing meningioma and adjacent vasogenic edema in the brain in comparison with prior MRI given differences in technique. 5. Bilateral carotid bifurcation calcific atherosclerosis with mild 40% left-sided proximal ICA stenosis. 6. Calcified plaque at bilateral vertebral artery origins without high-grade stenosis.   MRI 1. Acute posterior left MCA territory infarct, primarily affecting the superior left temporal gyrus and a portion of the anterior left occipital lobe. No associated acute hemorrhage. 2. Developing  cytotoxic edema related to #1 superimposed on chronic vasogenic edema in the left temporal lobe related to the left sphenoid wing meningioma which appears grossly stable since March. No acute mass effect at this time. 3. Underlying chronic left middle MCA and posterior MCA ischemia. Superimposed chronic right MCA and right cerebellar infarcts.   EEG This awake and asleep EEG is abnormal due to the presence of: 1. Moderate diffuse slowing of the background 2.  Additional focal slowing over the left frontotemporal region  TTE  - Left ventricle: The cavity size was normal. Wall thickness was   increased in a pattern of moderate LVH. Systolic function was   normal. The estimated ejection fraction was in the range of 60%   to 65%. Wall motion was normal; there were no regional wall   motion abnormalities. - Aortic root: The aortic root was mildly dilated. - Mitral valve: Calcified annulus. - Left atrium: The atrium was mildly dilated. Impressions: - Normal LV systolic function; moderate LVH; mild LAE; mildly   dilated aortic root (4.3 cm); suggest CTA or MRA to further   assess.   PHYSICAL EXAM  Temp:  [97.6 F (36.4 C)-98.9 F (37.2 C)] 98.3 F (36.8 C) (10/12 2021) Pulse Rate:  [82-103] 103 (10/12 2021) Resp:  [16-20] 20 (10/12 2021) BP: (141-178)/(71-89) 164/89 (10/12 2021) SpO2:  [96 %-99 %] 96 % (10/12 2021)  General - Well nourished, well developed, sleepy and lethargic.  Ophthalmologic - Fundi not visualized due to noncooperation.  Cardiovascular - Regular rate and rhythm.  Neuro - sleepy and lethargic, able to open eyes on voice but not able to maintain eye opening. Not following commands and global aphasia, only able to say "no" and "please". Left eye preference, seems to have right neglect. Not blinking to visual threat bilaterally on forced eye opening. Facial symmetrical and PERRL. LUE against gravity without drift, RUE drift to bed slowly, LLE with paratonia. LLE able to hold on knee flexion position, but RLE not able to hold onto knee flexion position with gradual drift. b/l babinski positive. Sensation, coordination and gait not able to test.    ASSESSMENT/PLAN [name] is a 82 y.o. male with history of atrial fibrillation on DAPT, seizures, HTN, renal cancer, prior stroke presenting with difficulty speaking. He did not receive IV t-PA due to delay in arrival.   Stroke:  left MCA infarct, secondary to afib not  on AC vs. Large vessel disease.   Resultant  Global aphasia and right hemiparesis.  MRI  Left MCA infarct. Bilateral MCA encephalomalcia  CTA head and neck  R A2 occlusion. R M1 severe stenosis. Significant atherosclerosis.  2D Echo EF 60-65%  EEG - diffuse slowing with left frontotemporal focal slowing  LDL 71  HgbA1c 5.3  Lovenox 40 mg sq daily for VTE prophylaxis DIET - DYS 1 Room service appropriate? Yes; Fluid consistency: Thin  clopidogrel 75 mg daily prior to admission, now on plavix 75mg  daily. Suspect the reason why pt was not on [facility] is possible hx of ICH requiring surgical evacuation. Also he may not be a good candidate for [facility] due to advanced age as well as poor functional status.   Patient counseled to be compliant with his antithrombotic medications  Ongoing aggressive stroke risk factor management  Therapy recommendations:  pending   Disposition:  pending   Atrial Fibrillation  Home anticoagulation:  none   CHA2DS2-VASc Score = 6, ?2 oral anticoagulation recommended  Age in Years:  ?42   +2  Sex:  Male   0    Hypertension History:  yes   +1     Diabetes Mellitus:  0   Congestive Heart Failure History:  0  Vascular Disease History:  yes   +1     Stroke/TIA/Thromboembolism History:  yes   +2  Suspect the reason why pt was not on [facility] is possible hx of ICH requiring surgical evacuation. Also he may not be a good candidate for [facility] due to advanced age as well as poor functional status.  ? Hx of stroke  Bilateral MCA encephalomalacia   However, also has bilateral hemicraniectomy as per CT bone window  As per son, he had both ischemic strokes and at least one hemorrhagic stroke requiring surgical evacuation.    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  Left cerebellar small meningioma  Stable so far  Seizure disorder  EEG 03/2015 with left frontocentral slowing and epileptic discharges and sharp waves  On keppra and  phenobarbital  Repeat EEG today showed only diffuse slow with left frontotemporal focal slowing    Hypertension  Stable Permissive hypertension (OK if < 180/105) but gradually normalize in 5-7 days Long-term BP goal normotensive  Hyperlipidemia  Home meds:  crestor 5  LDL 71, goal < 70  Resume crestor  Continue statin at discharge  Other Stroke Risk Factors  Advanced age  Former Cigarette smoker  Obesity, Body mass index is 35.32 kg/m., recommend weight loss, diet and exercise as appropriate   Other Active Problems  COPD  Hx L renal cancer  Hx of inguinal hernia  Hospital day # 1  Neurology will sign off. Please call with questions. Pt will follow up with Dr. Erlinda Hong at [facility] in about 6 weeks. Thanks for the consult.   Rosalin Hawking, MD PhD Stroke Neurology 10/15/2015 10:16 PM   To contact Stroke Continuity provider, please refer to [email or web address] After hours, contact General Neurology

## 2015-10-15 NOTE — Progress Notes (Addendum)
PROGRESS NOTE    Arthur Wood  [id number] DOB: July 15, 1931 DOA: 10/13/2015 PCP: Arthur Hams, MD   Outpatient Specialists:     Brief Narrative:  Arthur Wood is a 82 y.o. male with medical history significant of hypertension, atrial fibrillation, prior stroke with residual numbness to the right side, and seizures who presents with AMS. Per son who provide history patient would not wake up in the morning, when he did get up, his speech was garbled. He didn't pay too much attention to that. Subsequently patient was less interactive, talkative and felt asleep at some point. He notice some changes and brought him for evaluation. He reported similar episode last year, and patient was thought to have a seizure. Per son (who is mentally disabled) reports patient has been taking medications.  Wife who was listed as Arthur Wood is deceased.  Patient lived with son who is mentally ill and APS was involved.  Social worker is Arthur Wood.  Social Work consult placed.  Son in [place], Arthur Wood, is Media planner.  Assessment & Plan:   Active Problems:   Altered mental status   Hypertension   Asthma   Acute encephalopathy   Seizures ([place])   H/O: CVA (cerebrovascular accident)   Confusion   Stroke ([place])   Meningioma ([place])   Acute CVA (cerebrovascular accident) [facility])   CVA  Neurology consulted.  Continue with anti-seizure medications.  MRI showed CVA -ECHO pending On plavix- started today now that he is able to swallow  Permissive HTN Aspirin   LE cellulitis right worse than left.  duplex -much improved redness it appears without ever getting doxy-- will d/c Change lasix to IV  HTN;  Continue with nifedipine, hold  Clonidine, permissive HTN in setting stroke. Marland Kitchen  Hold lisinopril.   COPD;  Continue with salmeterol.    Seizure; continue with phenobarbital and keppra.  EEG done  Meningioma on CT;  MRI shows Developing cytotoxic edema related to #1 superimposed on  chronic vasogenic edema in the left temporal lobe related to the left sphenoid wing meningioma which appears grossly stable since March.   DVT prophylaxis:  Lovenox   Code Status: Full Code  Family Communication: Son, Arthur Wood on phone  Disposition Plan:     Consultants:   neuro  Procedures:        Subjective: Speaks but very difficult to understand  Objective: Vitals:   10/15/15 0155 10/15/15 0524 10/15/15 0755 10/15/15 0958  BP: (!) 141/71 (!) 153/83  (!) 157/84  Pulse: 82 85  (!) 103  Resp: 16 18  18   Temp:  98.9 F (37.2 C)  98.6 F (37 C)  TempSrc:  Oral  Oral  SpO2: 98% 99% 98% 96%  Weight:       No intake or output data in the 24 hours ending 10/15/15 1104 Filed Weights   10/13/15 2028  Weight: 108.5 kg (239 lb 3.2 oz)    Examination:  General exam: awake, speech garbled Respiratory system: diminished, no wheexzing Cardiovascular system: S1 & S2 heard, RRR. No JVD, murmurs, rubs, gallops or clicks. No pedal edema. Gastrointestinal system: Abdomen is nondistended, soft and nontender. No organomegaly or masses felt. Normal bowel sounds heard. Central nervous system: would not follow commands    Data Reviewed: I have personally reviewed following labs and imaging studies  CBC:  Recent Labs Lab 10/13/15 1259 10/13/15 1343 10/15/15 0604  WBC  --  5.5 9.0  NEUTROABS  --  2.8  --   HGB 12.9* 12.7* 13.6  HCT 38.0* 38.4* 40.9  MCV  --  93.0 91.7  PLT  --  155 0000000   Basic Metabolic Panel:  Recent Labs Lab 10/13/15 1244 10/13/15 1259 10/15/15 0604  NA 143 144 139  K 3.9 3.9 3.8  CL 108 105 105  CO2 29  --  26  GLUCOSE 121* 113* 108*  BUN 17 21* 12  CREATININE 1.04 1.00 0.98  CALCIUM 8.7*  --  8.4*   GFR: CrCl cannot be calculated (Unknown ideal weight.). Liver Function Tests:  Recent Labs Lab 10/13/15 1244  AST 17  ALT 13*  ALKPHOS 77  BILITOT 0.3  PROT 5.8*  ALBUMIN 3.4*   No results for input(s): LIPASE, AMYLASE  in the last 168 hours. No results for input(s): AMMONIA in the last 168 hours. Coagulation Profile:  Recent Labs Lab 10/13/15 1244  INR 1.12   Cardiac Enzymes: No results for input(s): CKTOTAL, CKMB, CKMBINDEX, TROPONINI in the last 168 hours. BNP (last 3 results) No results for input(s): PROBNP in the last 8760 hours. HbA1C:  Recent Labs  10/14/15 0532  HGBA1C 5.3   CBG:  Recent Labs Lab 10/15/15 0736  GLUCAP 105*   Lipid Profile:  Recent Labs  10/14/15 0532  CHOL 137  HDL 50  LDLCALC 71  TRIG 78  CHOLHDL 2.7   Thyroid Function Tests: No results for input(s): TSH, T4TOTAL, FREET4, T3FREE, THYROIDAB in the last 72 hours. Anemia Panel: No results for input(s): VITAMINB12, FOLATE, FERRITIN, TIBC, IRON, RETICCTPCT in the last 72 hours. Urine analysis:    Component Value Date/Time   COLORURINE YELLOW 10/13/2015 1628   APPEARANCEUR CLEAR 10/13/2015 1628   LABSPEC 1.020 10/13/2015 1628   PHURINE 6.0 10/13/2015 1628   GLUCOSEU NEGATIVE 10/13/2015 1628   HGBUR NEGATIVE 10/13/2015 1628   BILIRUBINUR NEGATIVE 10/13/2015 1628   KETONESUR NEGATIVE 10/13/2015 1628   PROTEINUR NEGATIVE 10/13/2015 1628   UROBILINOGEN 1.0 01/11/2010 0818   NITRITE NEGATIVE 10/13/2015 1628   LEUKOCYTESUR SMALL (A) 10/13/2015 1628     )No results found for this or any previous visit (from the past 240 hour(s)).    Anti-infectives    Start     Dose/Rate Route Frequency Ordered Stop   10/13/15 1800  doxycycline (VIBRA-TABS) tablet 100 mg  Status:  Discontinued     100 mg Oral Every 12 hours 10/13/15 1748 10/14/15 1306       Radiology Studies: Ct Angio Head W Or Wo Contrast  Addendum Date: 10/14/2015   ADDENDUM REPORT: 10/14/2015 07:02 ADDENDUM: These results will be called to the ordering clinician or representative by the Radiologist Assistant, and communication documented in the PACS or zVision Dashboard. Electronically Signed   By: Kristine Garbe M.D.   On:  10/14/2015 07:02   Result Date: 10/14/2015 CLINICAL DATA:  82 y/o M; woke this morning with garbled speech history history of prior stroke with residual numbness on the right side and seizures. EXAM: CT ANGIOGRAPHY HEAD AND NECK TECHNIQUE: Multidetector CT imaging of the head and neck was performed using the standard protocol during bolus administration of intravenous contrast. Multiplanar CT image reconstructions and MIPs were obtained to evaluate the vascular anatomy. Carotid stenosis measurements (when applicable) are obtained utilizing NASCET criteria, using the distal internal carotid diameter as the denominator. CONTRAST:  80 cc Isovue 370 COMPARISON:  10/13/2015 CT head.  03/15/2015 MRI head. FINDINGS: CTA NECK FINDINGS Aortic arch: Standard branching. Imaged portion shows no evidence of aneurysm or dissection. No significant stenosis of the major  arch vessel origins. Right carotid system: No evidence of dissection, stenosis (50% or greater) or occlusion. Left carotid system: No evidence of dissection, stenosis (50% or greater) or occlusion. Calcific atherosclerosis of carotid bifurcation with proximal left ICA mild stenosis of 40%. Vertebral arteries: Left dominant. Calcific atherosclerosis of bilateral origins without high-grade stenosis. No evidence for dissection or occlusion. Skeleton: Moderate spondylosis of the cervical spine without high-grade canal stenosis or foraminal narrowing. Other neck: Multiple subcentimeter thyroid nodules. No lymphadenopathy or discrete cervical mass is otherwise identified in the neck. The aerodigestive tract is patent. Postinflammatory calcifications of palatini tonsils. Paranasal sinuses and mastoid air cells are normally aerated. Upper chest: Platelike atelectasis along the left major fissure. Review of the MIP images confirms the above findings CTA HEAD FINDINGS Anterior circulation: Right proximal A2 occlusion (series 406, image 18). Extensive calcific  atherosclerosis of internal carotid arteries and cavernous segment of terminus with mild to moderate underlying stenosis. Severe atherosclerotic irregularity of the bilateral M1 and proximal M2 segments with moderate to severe mid right M1 segment stenosis. Irregularity of the patent left anterior cerebral artery with moderate stenosis of proximal A2 segment. Posterior circulation: Mild calcific atherosclerosis of bilateral V4 segments and luminal irregularity of the basilar artery and proximal posterior cerebral arteries with short segments of mild stenosis. No occlusion, aneurysm, or vascular malformation is identified. Venous sinuses: As permitted by contrast timing, patent. Anatomic variants: Right fetal posterior cerebral artery. No left posterior communicating artery identified, likely hypoplastic or absent. Left larger than right A1 segments and large anterior communicating artery. Delayed phase: Left sphenoid wing meningioma is grossly stable in comparison with the prior MRI. There is lucency in the adjacent left inferior frontal lobe probably representing associated vasogenic edema which is also stable. No new focus of abnormal enhancement of the brain parenchyma. Review of the MIP images confirms the above findings IMPRESSION: 1. Right proximal A2 occlusion. 2. Extensive intracranial atherosclerosis in the anterior and posterior circulation with multiple areas of stenosis greatest in the right mid M1 segment where it is moderate to severe. 3. Otherwise no evidence for intracranial large vessel occlusion, aneurysm, or vascular malformation. 4. Grossly stable left sphenoid wing meningioma and adjacent vasogenic edema in the brain in comparison with prior MRI given differences in technique. 5. Bilateral carotid bifurcation calcific atherosclerosis with mild 40% left-sided proximal ICA stenosis. 6. Calcified plaque at bilateral vertebral artery origins without high-grade stenosis. Electronically Signed: By:  Kristine Garbe M.D. On: 10/14/2015 05:13   Dg Chest 2 View  Result Date: 10/13/2015 CLINICAL DATA:  Altered mental status. History of seizures, previous CVA, atrial fibrillation, asthma, former smoker, renal cell malignancy. EXAM: CHEST  2 VIEW COMPARISON:  Portable chest x-ray of March 15, 2015 FINDINGS: The right lung is well-expanded and clear. On the left mild elevation of the hemidiaphragm is again observed. There is no acute infiltrate. There is no pleural effusion or pneumothorax. The cardiac silhouette is enlarged. The pulmonary vascularity is normal. There is calcification in the wall of the aortic arch. There is stable soft tissue fullness in the paratracheal regions that is likely vascular. The bony thorax exhibits no acute abnormality. IMPRESSION: No acute cardiopulmonary abnormality. Stable cardiomegaly. Aortic atherosclerosis. Electronically Signed   By: David  Martinique M.D.   On: 10/13/2015 15:31   Ct Head Wo Contrast  Result Date: 10/13/2015 CLINICAL DATA:  Difficulty with speech. EXAM: CT HEAD WITHOUT CONTRAST TECHNIQUE: Contiguous axial images were obtained from the base of the skull through the vertex without  intravenous contrast. COMPARISON:  03/15/2015 FINDINGS: Brain: Large area of encephalomalacia is again noted involving the right temporal lobe. Similar appearance of low-attenuation edema involving the left temporal lobe with overlying left sphenoid wing meningioma. Left parietal lobe encephalomalacia is again identified and appears unchanged, image 20 of series 201. CSF attenuation overlying the right frontal lobe is unchanged from previous exam measuring 11 mm in thickness, image 25 of series 201. 8 mm meningioma overlying the left cerebellar hemisphere is unchanged, image 5 of series 201. No evidence for acute brain infarct. No intracranial hemorrhage identified. Vascular: No hyperdense vessel or unexpected calcification. Skull: Previous right parietal craniotomy and left  frontal craniotomy. Sinuses/Orbits: No acute finding. Other: None. IMPRESSION: 1. No acute intracranial abnormalities. 2. Stable encephalomalacia involving the right temporal lobe. 3. 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 4. Similar appearance of left sphenoid wing meningioma with brain edema involving the left temporal lobe. 5. Stable left posterior fossa meningioma. Electronically Signed   By: Kerby Moors M.D.   On: 10/13/2015 13:21   Ct Angio Neck W Or Wo Contrast  Addendum Date: 10/14/2015   ADDENDUM REPORT: 10/14/2015 07:02 ADDENDUM: These results will be called to the ordering clinician or representative by the Radiologist Assistant, and communication documented in the PACS or zVision Dashboard. Electronically Signed   By: Kristine Garbe M.D.   On: 10/14/2015 07:02   Result Date: 10/14/2015 CLINICAL DATA:  82 y/o M; woke this morning with garbled speech history history of prior stroke with residual numbness on the right side and seizures. EXAM: CT ANGIOGRAPHY HEAD AND NECK TECHNIQUE: Multidetector CT imaging of the head and neck was performed using the standard protocol during bolus administration of intravenous contrast. Multiplanar CT image reconstructions and MIPs were obtained to evaluate the vascular anatomy. Carotid stenosis measurements (when applicable) are obtained utilizing NASCET criteria, using the distal internal carotid diameter as the denominator. CONTRAST:  80 cc Isovue 370 COMPARISON:  10/13/2015 CT head.  03/15/2015 MRI head. FINDINGS: CTA NECK FINDINGS Aortic arch: Standard branching. Imaged portion shows no evidence of aneurysm or dissection. No significant stenosis of the major arch vessel origins. Right carotid system: No evidence of dissection, stenosis (50% or greater) or occlusion. Left carotid system: No evidence of dissection, stenosis (50% or greater) or occlusion. Calcific atherosclerosis of carotid bifurcation with proximal left ICA mild  stenosis of 40%. Vertebral arteries: Left dominant. Calcific atherosclerosis of bilateral origins without high-grade stenosis. No evidence for dissection or occlusion. Skeleton: Moderate spondylosis of the cervical spine without high-grade canal stenosis or foraminal narrowing. Other neck: Multiple subcentimeter thyroid nodules. No lymphadenopathy or discrete cervical mass is otherwise identified in the neck. The aerodigestive tract is patent. Postinflammatory calcifications of palatini tonsils. Paranasal sinuses and mastoid air cells are normally aerated. Upper chest: Platelike atelectasis along the left major fissure. Review of the MIP images confirms the above findings CTA HEAD FINDINGS Anterior circulation: Right proximal A2 occlusion (series 406, image 18). Extensive calcific atherosclerosis of internal carotid arteries and cavernous segment of terminus with mild to moderate underlying stenosis. Severe atherosclerotic irregularity of the bilateral M1 and proximal M2 segments with moderate to severe mid right M1 segment stenosis. Irregularity of the patent left anterior cerebral artery with moderate stenosis of proximal A2 segment. Posterior circulation: Mild calcific atherosclerosis of bilateral V4 segments and luminal irregularity of the basilar artery and proximal posterior cerebral arteries with short segments of mild stenosis. No occlusion, aneurysm, or vascular malformation is identified. Venous sinuses: As permitted  by contrast timing, patent. Anatomic variants: Right fetal posterior cerebral artery. No left posterior communicating artery identified, likely hypoplastic or absent. Left larger than right A1 segments and large anterior communicating artery. Delayed phase: Left sphenoid wing meningioma is grossly stable in comparison with the prior MRI. There is lucency in the adjacent left inferior frontal lobe probably representing associated vasogenic edema which is also stable. No new focus of abnormal  enhancement of the brain parenchyma. Review of the MIP images confirms the above findings IMPRESSION: 1. Right proximal A2 occlusion. 2. Extensive intracranial atherosclerosis in the anterior and posterior circulation with multiple areas of stenosis greatest in the right mid M1 segment where it is moderate to severe. 3. Otherwise no evidence for intracranial large vessel occlusion, aneurysm, or vascular malformation. 4. Grossly stable left sphenoid wing meningioma and adjacent vasogenic edema in the brain in comparison with prior MRI given differences in technique. 5. Bilateral carotid bifurcation calcific atherosclerosis with mild 40% left-sided proximal ICA stenosis. 6. Calcified plaque at bilateral vertebral artery origins without high-grade stenosis. Electronically Signed: By: Kristine Garbe M.D. On: 10/14/2015 05:13   Mr Brain Wo Contrast  Result Date: 10/14/2015 CLINICAL DATA:  82 year old male with altered mental status, garbled speech on waking today. Proximal right A2 occlusion and moderate to severe right M1 stenosis on CTA head and neck earlier today. Left sphenoid wing meningioma with cavernous sinus invasion. Initial encounter. EXAM: MRI HEAD WITHOUT CONTRAST TECHNIQUE: Multiplanar, multiecho pulse sequences of the brain and surrounding structures were obtained without intravenous contrast. COMPARISON:  CTA head and neck 0411 hours today. Brain MRI 03/15/2015. FINDINGS: Brain: Confluent restricted diffusion in the posterior left MCA territory affecting the left temporal lobe, especially the superior left temporal gyrus, and a portion of the anterior left occipital lobe. There is acute on chronic left temporal lobe edema related to the acute infarct, and also to the chronic left middle cranial fossa meningioma. No associated acute hemorrhage or acute intracranial mass effect. Chronic right temporal lobe encephalomalacia. Extensive chronic perivascular space dilatation in the deep gray  matter nuclei. Chronic encephalomalacia and hemosiderin in the left parietal lobe. Chronic left frontal operculum encephalomalacia. Small chronic right cerebellar lacunar infarct. Sequelae of bilateral craniotomies. No ventriculomegaly. No acute intracranial mass effect or hemorrhage. Partially empty sella. Negative cervicomedullary junction. The left sphenoid wing meningioma appears grossly stable since March in the absence of contrast (series 4, image 10). Vascular: Major intracranial vascular flow voids are stable since March with a degree of generalized intracranial artery dolichoectasia. Skull and upper cervical spine: Negative. Visualized bone marrow signal is within normal limits. Sinuses/Orbits: Orbit and scalp soft tissues appear stable. Stable well pneumatized paranasal sinuses. Other: Visible internal auditory structures appear normal. Mastoids are clear. IMPRESSION: 1. Acute posterior left MCA territory infarct, primarily affecting the superior left temporal gyrus and a portion of the anterior left occipital lobe. No associated acute hemorrhage. 2. Developing cytotoxic edema related to #1 superimposed on chronic vasogenic edema in the left temporal lobe related to the left sphenoid wing meningioma which appears grossly stable since March. No acute mass effect at this time. 3. Underlying chronic left middle MCA and posterior MCA ischemia. Superimposed chronic right MCA and right cerebellar infarcts. Electronically Signed   By: Genevie Ann M.D.   On: 10/14/2015 14:54        Scheduled Meds: . arformoterol  15 mcg Nebulization BID  . aspirin  300 mg Rectal Daily  . budesonide  0.25 mg Inhalation BID  .  clopidogrel  75 mg Oral Daily  . enoxaparin (LOVENOX) injection  40 mg Subcutaneous Q24H  . furosemide  20 mg Intravenous Daily  . levETIRAcetam  500 mg Intravenous Q12H  . multivitamin with minerals  1 tablet Oral Daily  . NIFEdipine  60 mg Oral Daily  . PHENObarbital  90 mg Intravenous QHS  .  rosuvastatin  5 mg Oral QPM   Continuous Infusions:    LOS: 1 day    Time spent: 25 min    Jensen Beach, DO [facility] Pager [phone]  If 7PM-7AM, please contact night-coverage [email or web address] Password [email or web address] 10/15/2015, 11:04 AM

## 2015-10-15 NOTE — Evaluation (Signed)
Clinical/Bedside Swallow Evaluation Patient Details  Name: Arthur Wood MRN: [id number] Date of Birth: [birth year]  Today's Date: 10/15/2015 Time: SLP Start Time (ACUTE ONLY): 0849 SLP Stop Time (ACUTE ONLY): 0927 SLP Time Calculation (min) (ACUTE ONLY): 38 min  Past Medical History:  Past Medical History:  Diagnosis Date  . Asthma   . Atrial fibrillation ([place])   . Hypertension   . lt renal ca dx'd 03/2010  . Seizures ([place])   . Stroke [facility])    Past Surgical History: History reviewed. No pertinent surgical history. HPI:  82 y.o.malewith medical history significant of hypertension, atrial fibrillation, prior stroke with residual numbness to the right side, and seizures who presents with AMS. hest x ray no acute cardio-pulmonary abnormalities. CT head no acute intracranial abnormalities, stable encephalomalacia involving the right temporal lobe. 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 Similar appearance of left sphenoid wing meningioma with brain edema involving the left temporal lobe. Stable left posterior fossa meningioma.   Assessment / Plan / Recommendation Clinical Impression  Pt presents with moderate oral phase dysphagia c/b decrease lingual bolus manipulation and mild AP transit delay of puree trials. Given mild extra time per bolus, pt able to clear puree trials. Pt consumed trials of thin liquids via straw without overt s/s of aspiration even with multiple consecutive sips. Given pt's difficulty following directions, I recommend conservative diet of dysphagia 1 with thin liquids (straws ok) with medicine crushed in puree with full nursing supervision during PO's. ST to follow pt for diet tolerance and possibility of diet advancement.     Aspiration Risk  Moderate aspiration risk    Diet Recommendation Dysphagia 1 (Puree);Thin liquid   Liquid Administration via: Straw Medication Administration: Crushed with puree Supervision: Full supervision/cueing for  compensatory strategies;Staff to assist with self feeding Compensations: Minimize environmental distractions;Slow rate;Small sips/bites;Follow solids with liquid Postural Changes: Seated upright at 90 degrees    Other  Recommendations Oral Care Recommendations: Oral care BID   Follow up Recommendations None      Frequency and Duration min 2x/week  2 weeks       Prognosis Prognosis for Safe Diet Advancement: Fair Barriers to Reach Goals: Cognitive deficits      Swallow Study   General HPI: 82 y.o.malewith medical history significant of hypertension, atrial fibrillation, prior stroke with residual numbness to the right side, and seizures who presents with AMS. hest x ray no acute cardio-pulmonary abnormalities. CT head no acute intracranial abnormalities, stable encephalomalacia involving the right temporal lobe. 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 Similar appearance of left sphenoid wing meningioma with brain edema involving the left temporal lobe. Stable left posterior fossa meningioma. Type of Study: Bedside Swallow Evaluation Previous Swallow Assessment: N/A Diet Prior to this Study: NPO Temperature Spikes Noted: No Respiratory Status: Nasal cannula (2 liters) History of Recent Intubation: No Behavior/Cognition: Alert;Confused;Doesn't follow directions Oral Cavity Assessment: Within Functional Limits Oral Care Completed by SLP: Yes Oral Cavity - Dentition: Missing dentition;Poor condition (Edentulous top) Vision:  (Unknown, cognitively unable to feed self this day) Self-Feeding Abilities: Total assist Patient Positioning: Upright in bed Baseline Vocal Quality: Not observed Volitional Cough: Strong Volitional Swallow: Unable to elicit    Oral/Motor/Sensory Function Overall Oral Motor/Sensory Function: Within functional limits   Ice Chips Ice chips: Within functional limits Presentation: Spoon   Thin Liquid Thin Liquid: Within functional  limits Presentation: Spoon;Straw    Nectar Thick Nectar Thick Liquid: Not tested   Honey Thick Honey Thick Liquid: Not tested  Puree Puree: Within functional limits Presentation: Spoon   Solid   GO   Solid: Not tested        Arthur Wood B. Rutherford Nail M.S., [place] Speech-Language Pathologist   Oral [place]ache 10/15/2015,9:33 AM

## 2015-10-15 NOTE — Consult Note (Signed)
Physical Medicine and Rehabilitation Consult   Reason for Consult: Left gaze preference, confusion and difficulty speaking Referring Physician: Dr.    Harrison Mons: Arthur Wood is a 82 y.o. male with history of HTN, Afib, seizures disorder, bilateral crani due to stroke with right sided weakness/sensory loss, who was admitted 10/13/15 with nonsensical verbal output and left gaze preference.  He was found to have global aphasia and mild right sided weakness. CT head done showing stable left frontal lobe subdural hygroma and encephalomalacia right temporal lobe. CTA head/neck showed extensive atherosclerosis anterior and posterior circulation with multiple areas of stenosis greatest in mid M1 segment, bilateral 40% proximal ICA stenosis and grossly stable left sphenoid wing meningioma with adjacent vasogenic edema. MRI brain done shows acute posterior L-MCA infarct affecting left temporal gyrus and portion of anterior left occipital lobe with developing edema superimposed on chronic vasogenic edema. Patient on plavix PTA and ASA added. Dr. Erlinda Hong questioned prior functional status and felt that patient not good AC candidate. EEG without seizure activity and moderate slowing left frontotemporal region. ST evaluation with moderate oral phase dysphagia--placed on dysphagia 1, thin liquids. PT evaluation done revealing heavy lean to the right as well as difficulty ambulating. CIR recommended for follow up therapy.   Per son: patient needed assistance with transfers. "Needed help to get feet in bed" due to chronic edema. Patient was able to ambulate with walker and dressed himself at bedside every morning, paid bills.  Patient walked across the hall with his walker to his son's apartment and got breakfast every morning. He "babied his right arm after the most recent stroke," but he was able to use it somewhat   Review of Systems  Unable to perform ROS: Patient nonverbal  Eyes: Negative for blurred vision and  double vision.  Gastrointestinal:       Wears depends  Neurological: Positive for sensory change (h/o sensory deficits right side), speech change and focal weakness (chronic  ild right sided weakness).    Past Medical History:  Diagnosis Date  . Asthma   . Atrial fibrillation ([place])   . Hypertension   . lt renal ca dx'd 03/2010  . Seizures ([place])   . Stroke [facility])     Past Surgical History:  Procedure Laterality Date  . CRANIOTOMY  1981   bilateral crani for "stroke"  . TONSILLECTOMY AND ADENOIDECTOMY       Family History  Problem Relation Age of Onset  . Stroke Mother   . Depression Father   . Suicidality Father      Social History:  Lives with son. Per reports that he quit smoking in 1980.  His smoking use included Cigarettes. He has quit using smokeless tobacco. He reports that he does not drink alcohol or use drugs.    Allergies: No Known Allergies    Medications Prior to Admission  Medication Sig Dispense Refill  . albuterol (PROVENTIL HFA;VENTOLIN HFA) 108 (90 Base) MCG/ACT inhaler Inhale 1-2 puffs into the lungs every 4 (four) hours as needed for wheezing or shortness of breath.    . beclomethasone (QVAR) 80 MCG/ACT inhaler Inhale 1 puff into the lungs 2 (two) times daily.    . cloNIDine (CATAPRES) 0.1 MG tablet Take 1 tablet (0.1 mg total) by mouth daily. 60 tablet   . clopidogrel (PLAVIX) 75 MG tablet Take 75 mg by mouth daily.      . furosemide (LASIX) 20 MG tablet Take 20 mg by mouth daily.      Marland Kitchen  levETIRAcetam (KEPPRA) 500 MG tablet Take 1 tablet (500 mg total) by mouth 2 (two) times daily. 60 tablet 1  . lisinopril (PRINIVIL,ZESTRIL) 40 MG tablet Take 40 mg by mouth daily.      Marland Kitchen NIFEdipine (PROCARDIA XL/ADALAT-CC) 60 MG 24 hr tablet Take 1 tablet (60 mg total) by mouth daily.    Marland Kitchen PHENObarbital (LUMINAL) 60 MG tablet Take one and 1/2 tablet by mouth every evening (Patient taking differently: Take 90 mg by mouth daily. ) 45 tablet 5  . rosuvastatin (CRESTOR) 5  MG tablet Take 5 mg by mouth every evening.     . salmeterol (SEREVENT DISKUS) 50 MCG/DOSE diskus inhaler Inhale 1 puff into the lungs daily.    . Multiple Vitamins-Minerals (MULTIVITAMIN ADULT) CHEW Chew 2 each by mouth daily.      Home: Home Living Family/patient expects to be discharged to:: Private residence Living Arrangements: Children Available Help at Discharge: Family Type of Home: [place]: One level Bathroom Shower/Tub: Tub/shower unit Home Equipment: Environmental consultant - 2 wheels, Grab bars - toilet, Grab bars - tub/shower Additional Comments: Home setup and PLOF per son that he lives with.   Functional History: Prior Function Level of Independence: Independent with assistive device(s), Needs assistance Gait / Transfers Assistance Needed: son reports uses rw ADL's / Homemaking Assistance Needed: Son reports pt needed assist with bathing, could dress himself. Son prepares meals. Communication / Swallowing Assistance Needed: dysarthric from previous stroke Comments: Pt's son reports pt had some baseline residual weakness on right side, "he liked to cradle that arm across his body." Pt's son is mentally disabled per chart review.  Functional Status:  Mobility: Bed Mobility Overal bed mobility: Needs Assistance, +2 for physical assistance Bed Mobility: Rolling, Sidelying to Sit, Sit to Supine Rolling: Max assist, +2 for physical assistance Sidelying to sit: Max assist, +2 for physical assistance Sit to supine: Total assist, +2 for physical assistance General bed mobility comments: Rolled to L with MAX of 2 and then to sitting with MAX of 2 with pt showing effort to come to sitting EOB. Returning to supine, pt needed TOT A due to resistance Transfers Overall transfer level: Needs assistance Equipment used: Rolling walker (2 wheeled) Transfers: Sit to/from Stand Sit to Stand: Mod assist, Max assist, +2 physical assistance, From elevated surface General transfer comment: Stood  from elevated surface with MAX of 2 and pt able to wrap R hand around grip.  2nd stand, pt stood with MOD A of 2. Ambulation/Gait Ambulation/Gait assistance: Max assist, +2 physical assistance Ambulation Distance (Feet): 2 Feet Gait Pattern/deviations: Decreased step length - right, Decreased step length - left General Gait Details: Side stepping to L with heavy lean to the R and took increased time to move 2 feet up towards [facility] with multi-modal cueing.    ADL: ADL Overall ADL's : Needs assistance/impaired Eating/Feeding: Maximal assistance, Sitting Grooming: Maximal assistance, Sitting Upper Body Bathing: Maximal assistance, Sitting Lower Body Bathing: Maximal assistance, +2 for physical assistance Upper Body Dressing : Maximal assistance, Sitting Lower Body Dressing: Maximal assistance, +2 for physical assistance, Sit to/from stand Toilet Transfer Details (indicate cue type and reason): Pt able to stand and sidestep with +2 assist. Recommend steady for SPT.  Toileting- Clothing Manipulation and Hygiene: Maximal assistance, +2 for physical assistance Tub/Shower Transfer Details (indicate cue type and reason): Pt able to stand and sidestep with +2 assist. Recommend steady for SPT. Functional mobility during ADLs:  (not attempted due to zero standing balance) General ADL Comments:  Pt completed sit<>stand 2x from EOB. Sat several minutes on first attempt, initially min A improving min guard with R lean. Pt attempting to stand multiple times during session, impulsive. Several sidesteps towards left side with +2 assist and noted right lean.  Cognition: Cognition Overall Cognitive Status: Difficult to assess Orientation Level: Disoriented X4 Cognition Arousal/Alertness: Lethargic Behavior During Therapy: Impulsive Overall Cognitive Status: Difficult to assess Area of Impairment: Awareness, Safety/judgement, Problem solving Safety/Judgement: Decreased awareness of safety, Decreased  awareness of deficits Awareness: Intellectual Problem Solving: Slow processing, Requires tactile cues General Comments: aphasia Difficult to assess due to: Impaired communication, Level of arousal  Blood pressure (!) 157/84, pulse (!) 103, temperature 98.6 F (37 C), temperature source Oral, resp. rate 18, weight 108.5 kg (239 lb 3.2 oz), SpO2 96 %. Physical Exam  Nursing note and vitals reviewed. Constitutional: He appears well-developed and well-nourished. He appears lethargic.  Bilateral mittens in place.   HENT:  Head: Normocephalic and atraumatic.  Eyes:  Did not comply--barely open  Neck: Normal range of motion. Neck supple.  Cardiovascular: Normal rate and regular rhythm.   Respiratory: Effort normal and breath sounds normal. No stridor.  GI: Soft. Bowel sounds are normal. He exhibits no distension. There is no tenderness.  Genitourinary:  Genitourinary Comments: Large scrotal hernia.   Musculoskeletal: He exhibits edema (2+ edema BLE).  Neurological: He appears lethargic.  Grunting sounds. Did not follow any commands.   Skin: Skin is warm and dry.  Patient withdraws to pinch in the left upper and left lower extremity as well as right lower extremity, no withdraw to pinch in the right upper extremity Motor strength 3 minus grip with persistent grasp in the right upper limb. Otherwise was not able to move the upper extremity to command. Right lower extremity. Trace hip, knee extensor, once again difficult to do manual muscle testing due to cognitive status. Left upper limb was able to go through full active assisted range of motion, left lower limb. He complained of pain with movement and resisted manual muscle testing.  Results for orders placed or performed during the hospital encounter of 10/13/15 (from the past 24 hour(s))  CBC     Status: None   Collection Time: 10/15/15  6:04 AM  Result Value Ref Range   WBC 9.0 4.0 - 10.5 K/uL   RBC 4.46 4.22 - 5.81 MIL/uL    Hemoglobin 13.6 13.0 - 17.0 g/dL   HCT 40.9 39.0 - 52.0 %   MCV 91.7 78.0 - 100.0 fL   MCH 30.5 26.0 - 34.0 pg   MCHC 33.3 30.0 - 36.0 g/dL   RDW 13.8 11.5 - 15.5 %   Platelets 175 150 - 400 K/uL  Basic metabolic panel     Status: Abnormal   Collection Time: 10/15/15  6:04 AM  Result Value Ref Range   Sodium 139 135 - 145 mmol/L   Potassium 3.8 3.5 - 5.1 mmol/L   Chloride 105 101 - 111 mmol/L   CO2 26 22 - 32 mmol/L   Glucose, Bld 108 (H) 65 - 99 mg/dL   BUN 12 6 - 20 mg/dL   Creatinine, Ser 0.98 0.61 - 1.24 mg/dL   Calcium 8.4 (L) 8.9 - 10.3 mg/dL   GFR calc non Af Amer >60 >60 mL/min   GFR calc Af Amer >60 >60 mL/min   Anion gap 8 5 - 15  Glucose, capillary     Status: Abnormal   Collection Time: 10/15/15  7:36 AM  Result  Value Ref Range   Glucose-Capillary 105 (H) 65 - 99 mg/dL   Comment 1 Notify RN    Comment 2 Document in Chart    Ct Angio Head W Or Wo Contrast  Addendum Date: 10/14/2015   ADDENDUM REPORT: 10/14/2015 07:02 ADDENDUM: These results will be called to the ordering clinician or representative by the Radiologist Assistant, and communication documented in the PACS or zVision Dashboard. Electronically Signed   By: Kristine Garbe M.D.   On: 10/14/2015 07:02   Result Date: 10/14/2015 CLINICAL DATA:  82 y/o M; woke this morning with garbled speech history history of prior stroke with residual numbness on the right side and seizures. EXAM: CT ANGIOGRAPHY HEAD AND NECK TECHNIQUE: Multidetector CT imaging of the head and neck was performed using the standard protocol during bolus administration of intravenous contrast. Multiplanar CT image reconstructions and MIPs were obtained to evaluate the vascular anatomy. Carotid stenosis measurements (when applicable) are obtained utilizing NASCET criteria, using the distal internal carotid diameter as the denominator. CONTRAST:  80 cc Isovue 370 COMPARISON:  10/13/2015 CT head.  03/15/2015 MRI head. FINDINGS: CTA NECK  FINDINGS Aortic arch: Standard branching. Imaged portion shows no evidence of aneurysm or dissection. No significant stenosis of the major arch vessel origins. Right carotid system: No evidence of dissection, stenosis (50% or greater) or occlusion. Left carotid system: No evidence of dissection, stenosis (50% or greater) or occlusion. Calcific atherosclerosis of carotid bifurcation with proximal left ICA mild stenosis of 40%. Vertebral arteries: Left dominant. Calcific atherosclerosis of bilateral origins without high-grade stenosis. No evidence for dissection or occlusion. Skeleton: Moderate spondylosis of the cervical spine without high-grade canal stenosis or foraminal narrowing. Other neck: Multiple subcentimeter thyroid nodules. No lymphadenopathy or discrete cervical mass is otherwise identified in the neck. The aerodigestive tract is patent. Postinflammatory calcifications of palatini tonsils. Paranasal sinuses and mastoid air cells are normally aerated. Upper chest: Platelike atelectasis along the left major fissure. Review of the MIP images confirms the above findings CTA HEAD FINDINGS Anterior circulation: Right proximal A2 occlusion (series 406, image 18). Extensive calcific atherosclerosis of internal carotid arteries and cavernous segment of terminus with mild to moderate underlying stenosis. Severe atherosclerotic irregularity of the bilateral M1 and proximal M2 segments with moderate to severe mid right M1 segment stenosis. Irregularity of the patent left anterior cerebral artery with moderate stenosis of proximal A2 segment. Posterior circulation: Mild calcific atherosclerosis of bilateral V4 segments and luminal irregularity of the basilar artery and proximal posterior cerebral arteries with short segments of mild stenosis. No occlusion, aneurysm, or vascular malformation is identified. Venous sinuses: As permitted by contrast timing, patent. Anatomic variants: Right fetal posterior cerebral artery.  No left posterior communicating artery identified, likely hypoplastic or absent. Left larger than right A1 segments and large anterior communicating artery. Delayed phase: Left sphenoid wing meningioma is grossly stable in comparison with the prior MRI. There is lucency in the adjacent left inferior frontal lobe probably representing associated vasogenic edema which is also stable. No new focus of abnormal enhancement of the brain parenchyma. Review of the MIP images confirms the above findings IMPRESSION: 1. Right proximal A2 occlusion. 2. Extensive intracranial atherosclerosis in the anterior and posterior circulation with multiple areas of stenosis greatest in the right mid M1 segment where it is moderate to severe. 3. Otherwise no evidence for intracranial large vessel occlusion, aneurysm, or vascular malformation. 4. Grossly stable left sphenoid wing meningioma and adjacent vasogenic edema in the brain in comparison with prior MRI  given differences in technique. 5. Bilateral carotid bifurcation calcific atherosclerosis with mild 40% left-sided proximal ICA stenosis. 6. Calcified plaque at bilateral vertebral artery origins without high-grade stenosis. Electronically Signed: By: Kristine Garbe M.D. On: 10/14/2015 05:13   Ct Angio Neck W Or Wo Contrast  Addendum Date: 10/14/2015   ADDENDUM REPORT: 10/14/2015 07:02 ADDENDUM: These results will be called to the ordering clinician or representative by the Radiologist Assistant, and communication documented in the PACS or zVision Dashboard. Electronically Signed   By: Kristine Garbe M.D.   On: 10/14/2015 07:02   Result Date: 10/14/2015 CLINICAL DATA:  82 y/o M; woke this morning with garbled speech history history of prior stroke with residual numbness on the right side and seizures. EXAM: CT ANGIOGRAPHY HEAD AND NECK TECHNIQUE: Multidetector CT imaging of the head and neck was performed using the standard protocol during bolus  administration of intravenous contrast. Multiplanar CT image reconstructions and MIPs were obtained to evaluate the vascular anatomy. Carotid stenosis measurements (when applicable) are obtained utilizing NASCET criteria, using the distal internal carotid diameter as the denominator. CONTRAST:  80 cc Isovue 370 COMPARISON:  10/13/2015 CT head.  03/15/2015 MRI head. FINDINGS: CTA NECK FINDINGS Aortic arch: Standard branching. Imaged portion shows no evidence of aneurysm or dissection. No significant stenosis of the major arch vessel origins. Right carotid system: No evidence of dissection, stenosis (50% or greater) or occlusion. Left carotid system: No evidence of dissection, stenosis (50% or greater) or occlusion. Calcific atherosclerosis of carotid bifurcation with proximal left ICA mild stenosis of 40%. Vertebral arteries: Left dominant. Calcific atherosclerosis of bilateral origins without high-grade stenosis. No evidence for dissection or occlusion. Skeleton: Moderate spondylosis of the cervical spine without high-grade canal stenosis or foraminal narrowing. Other neck: Multiple subcentimeter thyroid nodules. No lymphadenopathy or discrete cervical mass is otherwise identified in the neck. The aerodigestive tract is patent. Postinflammatory calcifications of palatini tonsils. Paranasal sinuses and mastoid air cells are normally aerated. Upper chest: Platelike atelectasis along the left major fissure. Review of the MIP images confirms the above findings CTA HEAD FINDINGS Anterior circulation: Right proximal A2 occlusion (series 406, image 18). Extensive calcific atherosclerosis of internal carotid arteries and cavernous segment of terminus with mild to moderate underlying stenosis. Severe atherosclerotic irregularity of the bilateral M1 and proximal M2 segments with moderate to severe mid right M1 segment stenosis. Irregularity of the patent left anterior cerebral artery with moderate stenosis of proximal A2  segment. Posterior circulation: Mild calcific atherosclerosis of bilateral V4 segments and luminal irregularity of the basilar artery and proximal posterior cerebral arteries with short segments of mild stenosis. No occlusion, aneurysm, or vascular malformation is identified. Venous sinuses: As permitted by contrast timing, patent. Anatomic variants: Right fetal posterior cerebral artery. No left posterior communicating artery identified, likely hypoplastic or absent. Left larger than right A1 segments and large anterior communicating artery. Delayed phase: Left sphenoid wing meningioma is grossly stable in comparison with the prior MRI. There is lucency in the adjacent left inferior frontal lobe probably representing associated vasogenic edema which is also stable. No new focus of abnormal enhancement of the brain parenchyma. Review of the MIP images confirms the above findings IMPRESSION: 1. Right proximal A2 occlusion. 2. Extensive intracranial atherosclerosis in the anterior and posterior circulation with multiple areas of stenosis greatest in the right mid M1 segment where it is moderate to severe. 3. Otherwise no evidence for intracranial large vessel occlusion, aneurysm, or vascular malformation. 4. Grossly stable left sphenoid wing meningioma and  adjacent vasogenic edema in the brain in comparison with prior MRI given differences in technique. 5. Bilateral carotid bifurcation calcific atherosclerosis with mild 40% left-sided proximal ICA stenosis. 6. Calcified plaque at bilateral vertebral artery origins without high-grade stenosis. Electronically Signed: By: Kristine Garbe M.D. On: 10/14/2015 05:13   Mr Brain Wo Contrast  Result Date: 10/14/2015 CLINICAL DATA:  82 year old male with altered mental status, garbled speech on waking today. Proximal right A2 occlusion and moderate to severe right M1 stenosis on CTA head and neck earlier today. Left sphenoid wing meningioma with cavernous sinus  invasion. Initial encounter. EXAM: MRI HEAD WITHOUT CONTRAST TECHNIQUE: Multiplanar, multiecho pulse sequences of the brain and surrounding structures were obtained without intravenous contrast. COMPARISON:  CTA head and neck 0411 hours today. Brain MRI 03/15/2015. FINDINGS: Brain: Confluent restricted diffusion in the posterior left MCA territory affecting the left temporal lobe, especially the superior left temporal gyrus, and a portion of the anterior left occipital lobe. There is acute on chronic left temporal lobe edema related to the acute infarct, and also to the chronic left middle cranial fossa meningioma. No associated acute hemorrhage or acute intracranial mass effect. Chronic right temporal lobe encephalomalacia. Extensive chronic perivascular space dilatation in the deep gray matter nuclei. Chronic encephalomalacia and hemosiderin in the left parietal lobe. Chronic left frontal operculum encephalomalacia. Small chronic right cerebellar lacunar infarct. Sequelae of bilateral craniotomies. No ventriculomegaly. No acute intracranial mass effect or hemorrhage. Partially empty sella. Negative cervicomedullary junction. The left sphenoid wing meningioma appears grossly stable since March in the absence of contrast (series 4, image 10). Vascular: Major intracranial vascular flow voids are stable since March with a degree of generalized intracranial artery dolichoectasia. Skull and upper cervical spine: Negative. Visualized bone marrow signal is within normal limits. Sinuses/Orbits: Orbit and scalp soft tissues appear stable. Stable well pneumatized paranasal sinuses. Other: Visible internal auditory structures appear normal. Mastoids are clear. IMPRESSION: 1. Acute posterior left MCA territory infarct, primarily affecting the superior left temporal gyrus and a portion of the anterior left occipital lobe. No associated acute hemorrhage. 2. Developing cytotoxic edema related to #1 superimposed on chronic  vasogenic edema in the left temporal lobe related to the left sphenoid wing meningioma which appears grossly stable since March. No acute mass effect at this time. 3. Underlying chronic left middle MCA and posterior MCA ischemia. Superimposed chronic right MCA and right cerebellar infarcts. Electronically Signed   By: Genevie Ann M.D.   On: 10/14/2015 14:54    Assessment/Plan: Diagnosis: Right hemiparesis and aphasia secondary to acute left MCA distribution infarct. 1. Does the need for close, 24 hr/day medical supervision in concert with the patient's rehab needs make it unreasonable for this patient to be served in a less intensive setting? Potentially 2. Co-Morbidities requiring supervision/potential complications: Prior CVA, MI, atrial fibrillation, seizure disorder, hypertension 3. Due to bladder management, bowel management, safety, skin/wound care, disease management, medication administration, pain management and patient education, does the patient require 24 hr/day rehab nursing? Yes 4. Does the patient require coordinated care of a physician, rehab nurse, PT (1-2 hrs/day, 5 days/week), OT (1-2 hrs/day, 5 days/week) and SLP (.5-1 hrs/day, 5 days/week) to address physical and functional deficits in the context of the above medical diagnosis(es)? Yes Addressing deficits in the following areas: balance, endurance, locomotion, strength, transferring, bowel/bladder control, bathing, dressing, feeding, grooming, toileting, cognition, speech, language, swallowing and psychosocial support 5. Can the patient actively participate in an intensive therapy program of at least 3 hrs of  therapy per day at least 5 days per week? No 6. The potential for patient to make measurable gains while on inpatient rehab is fair 7. Anticipated functional outcomes upon discharge from inpatient rehab are min assist  with PT, min assist with OT, min assist and mod assist with SLP. 8. Estimated rehab length of stay to reach the  above functional goals is: 3 weeks 9. Does the patient have adequate social supports and living environment to accommodate these discharge functional goals? Potentially 10. Anticipated D/C setting: Home 11. Anticipated post D/C treatments: [place] therapy 12. Overall Rehab/Functional Prognosis: good  RECOMMENDATIONS: This patient's condition is appropriate for continued rehabilitative care in the following setting: CIR Patient has agreed to participate in recommended program. N/A Note that insurance prior authorization may be required for reimbursement for recommended care.  Comment: Patient currently not able to participate in inpatient" program. Rehabilitation admission coordinator will follow along to monitor therapy participation and tolerance    10/15/2015

## 2015-10-15 NOTE — Care Management Note (Signed)
Case Management Note  Patient Details  Name: Arthur Wood MRN: [id number] Date of Birth: Jan 30, 1931  Subjective/Objective:     Pt admitted with CVA. He is from home with his son. His other son is his POA and lives in [place].                Action/Plan: PT recommending CIR. Await OT. CM and CSW following for d/c needs.   Expected Discharge Date:                  Expected Discharge Plan:  [place]  In-House Referral:     Discharge planning Services     Post Acute Care Choice:    Choice offered to:     DME Arranged:    DME Agency:     HH Arranged:    [place] Agency:     Status of Service:  In process, will continue to follow  If discussed at Long Length of Stay Meetings, dates discussed:    Additional Comments:  Pollie Friar, RN 10/15/2015, 1:25 PM

## 2015-10-16 DIAGNOSIS — G9340 Encephalopathy, unspecified: Secondary | ICD-10-CM

## 2015-10-16 DIAGNOSIS — I63512 Cerebral infarction due to unspecified occlusion or stenosis of left middle cerebral artery: Principal | ICD-10-CM

## 2015-10-16 DIAGNOSIS — I10 Essential (primary) hypertension: Secondary | ICD-10-CM

## 2015-10-16 DIAGNOSIS — E785 Hyperlipidemia, unspecified: Secondary | ICD-10-CM

## 2015-10-16 NOTE — Progress Notes (Signed)
Physical Therapy Treatment Patient Details Name: Laterrian Mckernan MRN: [id number] DOB: 11/05/1931 Today's Date: 10/16/2015    History of Present Illness 82 y.o.malewith medical history significant of hypertension, atrial fibrillation, prior stroke with residual numbness to the right side, and seizures who presents with AMS. CT on 10/10 showed no acute intracranial abnormalities. MRI shows acute posterior L MCA territory infarct.  Pt also with hx of meningioma.    PT Comments    Pt more alert after transfer to recliner. Recommended to NT use of bari steady or Clarise Cruz for return to bed later today. Continue per POC.  Follow Up Recommendations  CIR;Supervision for mobility/OOB     Equipment Recommendations  None recommended by PT    Recommendations for Other Services       Precautions / Restrictions Precautions Precautions: Fall Precaution Comments: pt with significant LE and scrotal edema Restrictions Weight Bearing Restrictions: No    Mobility  Bed Mobility Overal bed mobility: Needs Assistance;+2 for physical assistance Bed Mobility: Supine to Sit     Supine to sit: Max assist;HOB elevated     General bed mobility comments: use of bed pad to scoot to EOB  Transfers       Sit to Stand: +2 physical assistance;Mod assist;Max assist         General transfer comment: Assist to attain full upright stance. Pelvic cueing for anterior weight translation. Sit to stand performed 4 times. Pt requiring increased assist with fatigue.  Ambulation/Gait                 Stairs            Wheelchair Mobility    Modified Rankin (Stroke Patients Only) Modified Rankin (Stroke Patients Only) Pre-Morbid Rankin Score: Moderate disability Modified Rankin: Severe disability     Balance     Sitting balance-Leahy Scale: Poor Sitting balance - Comments: right lean Postural control: Right lateral lean Standing balance support: Bilateral upper extremity  supported Standing balance-Leahy Scale: Zero Standing balance comment: heavy right lean                    Cognition Arousal/Alertness: Lethargic Behavior During Therapy: Flat affect Overall Cognitive Status: Difficult to assess                 General Comments: aphasia    Exercises      General Comments        Pertinent Vitals/Pain Pain Assessment: Faces Faces Pain Scale: Hurts little more Pain Location: groin/scrotal region with sitting Pain Descriptors / Indicators: Grimacing;Moaning Pain Intervention(s): Monitored during session;Repositioned    Home Living                      Prior Function            PT Goals (current goals can now be found in the care plan section) Acute Rehab PT Goals Patient Stated Goal: none stated PT Goal Formulation: Patient unable to participate in goal setting Time For Goal Achievement: 10/29/15 Potential to Achieve Goals: Good Progress towards PT goals: Progressing toward goals    Frequency    Min 4X/week      PT Plan Current plan remains appropriate    Co-evaluation PT/OT/SLP Co-Evaluation/Treatment: Yes Reason for Co-Treatment: For patient/therapist safety;Necessary to address cognition/behavior during functional activity PT goals addressed during session: Mobility/safety with mobility       End of Session Equipment Utilized During Treatment: Gait belt;Oxygen Activity Tolerance: Patient tolerated treatment well  Patient left: in chair;with call bell/phone within reach;with chair alarm set;with nursing/sitter in room     Time: [id number] PT Time Calculation (min) (ACUTE ONLY): 30 min  Charges:  $Therapeutic Activity: 8-22 mins                    G Codes:      Lorriane Shire 10/16/2015, 10:07 AM

## 2015-10-16 NOTE — Progress Notes (Signed)
Speech Language Pathology Treatment: Dysphagia  Patient Details Name: Arthur Wood MRN: [id number] DOB: 01-Nov-1931 Today's Date: 10/16/2015 Time: 1020-1040 SLP Time Calculation (min) (ACUTE ONLY): 20 min  Assessment / Plan / Recommendation Clinical Impression  Pt alert but was nonverbal this session. SLP provided Max A verbal cues for initial awareness of straw at lips. Pt able to consume 3 oz. Thin liquids via straw without overt s/s of aspiration. Nursing provides that pt is consuming dysphagia 1 without overt s/s of aspiration. Pt was not abel to follow verbal directions or responded verbally this session therefore advanced diet trials were not given. ST will continue to follow for assessment of langauge abilities when pt able to participate and for further diet assessment.    HPI HPI: 82 y.o.malewith medical history significant of hypertension, atrial fibrillation, prior stroke with residual numbness to the right side, and seizures who presents with AMS. hest x ray no acute cardio-pulmonary abnormalities. CT head no acute intracranial abnormalities, stable encephalomalacia involving the right temporal lobe. 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 Similar appearance of left sphenoid wing meningioma with brain edema involving the left temporal lobe. Stable left posterior fossa meningioma.      SLP Plan  Continue with current plan of care     Recommendations  Diet recommendations: Dysphagia 1 (puree);Thin liquid Liquids provided via: Straw Medication Administration: Crushed with puree Supervision: Full supervision/cueing for compensatory strategies;Staff to assist with self feeding Compensations: Minimize environmental distractions;Slow rate;Small sips/bites;Follow solids with liquid Postural Changes and/or Swallow Maneuvers: Seated upright 90 degrees                Oral Care Recommendations: Oral care BID Follow up Recommendations: Skilled Nursing  facility Plan: Continue with current plan of care       GO              Martiza Speth B. Rutherford Nail, M.S., CCC-SLP Speech-Language Pathologist    Shizue Kaseman 10/16/2015, 10:44 AM

## 2015-10-16 NOTE — Progress Notes (Signed)
Rehab admissions - Please see rehab consult done by Dr. Letta Pate.  Not ready for inpatient rehab at this time.  He is not fully participatory with therapies.  He is sitting up in chair but he is not verbalizing this am on my rounds.  Will follow progress for now.  Call me for questions.  [id number]

## 2015-10-16 NOTE — Consult Note (Signed)
            [facility] CM Primary Care Navigator  10/16/2015  Arthur Wood 1931-12-28 [id number]  Went to see patient at the bedside to identify possible discharge needs but CSW is discussing with family member (son) in the room.  MD note states patient will go to inpatient rehabilitation once stroke workup is completed. Rehab notes reveal patient is not ready for inpatient rehabilitation at this time since he is not fully participatory with therapies.  Patient was accepted at [place] [facility] facility per social worker's note and will discuss bed offer with son today.  For additional questions please contact:  Edwena Felty A. Khamila Bassinger, BSN, RN-BC [facility] PRIMARY CARE Navigator Cell: [phone]

## 2015-10-16 NOTE — Progress Notes (Signed)
MD notified patient too lethargic to take medication. Per MD, continue to monitor.

## 2015-10-16 NOTE — Progress Notes (Addendum)
Patient ID: Arthur Wood, male   DOB: [birth year], 82 y.o.   MRN: [id number]  PROGRESS NOTE    Briar Stpierre  [id number] DOB: [birth year] DOA: 10/13/2015  PCP: Kandice Hams, MD   Brief Narrative:  82 year old male with past medical history significant for hypertension, prior stroke with residual numbness to the right side, seizures, atrial fibrillation who presented to [facility] with altered mental status, garbled speech, less interactive. Patient was subsequently found to have acute posterior left MCA territory infarct affecting the superior left temporal gyrus and a portion of the anterior left occipital lobe but no acute hemorrhage. He also was found to have developing cytotoxic edema related to acute infarction superimposed on chronic vasogenic edema in the left temporal lobe related to left sphenoid wing meningioma which seems to be stable since March. No mass effect. Patient does have chronic left middle MCA and posterior MCA ischemia and superimposed chronic right MCA and right cerebellar infarcts.  Assessment and plan:   Acute posterior left MCA / Cytotoxic edema in the left temporal lobe related to meningioma / Acute encephalopathy  - Plavix daily  - MRI brain / MRA brain - cute posterior left MCA territory infarct affecting the superior left temporal gyrus and a portion of the anterior left occipital lobe but no acute hemorrhage. He also was found to have developing cytotoxic edema related to acute infarction superimposed on chronic vasogenic edema in the left temporal lobe related to left sphenoid wing meningioma which seems to be stable since March. No mass effect. Patient does have chronic left middle MCA and posterior MCA ischemia and superimposed chronic right MCA and right cerebellar infarcts. - CT angio head and neck showed right proximal A2 occlusion. Extensive intracranial atherosclerosis in the anterior and posterior circulation with multiple areas of stenosis  greatest in the right mid M1 segment where it is moderate to severe. Grossly stable left sphenoid wing meningioma and adjacent vasogenic edema. - EEG showed diffuse slowing  - 2D ECHO - EF 65% - HgbA1c 5.3 - Lipid panel - LDL 71.  LDL goal < 100. Patient on Crestor  - Diet - dysphagia 1 - Therapy: PT/OT - CIR  - Appreciate neurology following   LE cellulitis right worse than left.  - Stopped doxycycline   Essential hypertension   - Continue lasix and procardia   Dyslipidemia - Continue Crestor   COPD - Continue brovana, Pulmicort - Stable resp status   Seizures - Continue with phenobarbital and keppra.  - EEG - showed diffuse slowing   Meningioma on CT - Stable since prior study in March    DVT prophylaxis: Lovenox subcutaneous Code Status: full code  Family Communication: No family at the bedside Disposition Plan: To inpatient rehabilitation once stroke workup completed   Consultants:   PT  SLP  Inpatient rehab   Neurology   Procedures:   2 D ECHO 12/15/2015 - EF 65%, no regional wall motion abnormalities   EEG 10/14/2015 - diffuse slowing   Antimicrobials:   None    Subjective: No overnight events.   Objective: Vitals:   10/15/15 2021 10/16/15 0100 10/16/15 0500 10/16/15 0942  BP: (!) 164/89 (!) 149/85 (!) 155/82 136/77  Pulse: (!) 103 (!) 105 (!) 110 100  Resp: 20 19 18 16   Temp: 98.3 F (36.8 C) 98.5 F (36.9 C) 98.8 F (37.1 C) 97.5 F (36.4 C)  TempSrc: Axillary Axillary Axillary Axillary  SpO2: 96% 97% 97% 95%  Weight:  Intake/Output Summary (Last 24 hours) at 10/16/15 1304 Last data filed at 10/16/15 1216  Gross per 24 hour  Intake              240 ml  Output                0 ml  Net              240 ml   Filed Weights   10/13/15 2028  Weight: 108.5 kg (239 lb 3.2 oz)    Examination:  General exam: Appears calm and comfortable, no distress  Respiratory system: Clear to auscultation. Respiratory effort  normal. Cardiovascular system: S1 & S2 heard, Rate controlled  Gastrointestinal system: Abdomen is nondistended, soft and nontender. No organomegaly or masses felt. Normal bowel sounds heard. Central nervous system: very sleepy, wakes up briefly when called his name  Extremities: Palpable pulses, no tenderness  Skin: No rashes, lesions or ulcers Psychiatry: Sleepy, unable to adequately assess mental status   Data Reviewed: I have personally reviewed following labs and imaging studies  CBC:  Recent Labs Lab 10/13/15 1259 10/13/15 1343 10/15/15 0604  WBC  --  5.5 9.0  NEUTROABS  --  2.8  --   HGB 12.9* 12.7* 13.6  HCT 38.0* 38.4* 40.9  MCV  --  93.0 91.7  PLT  --  155 0000000   Basic Metabolic Panel:  Recent Labs Lab 10/13/15 1244 10/13/15 1259 10/15/15 0604  NA 143 144 139  K 3.9 3.9 3.8  CL 108 105 105  CO2 29  --  26  GLUCOSE 121* 113* 108*  BUN 17 21* 12  CREATININE 1.04 1.00 0.98  CALCIUM 8.7*  --  8.4*   GFR: CrCl cannot be calculated (Unknown ideal weight.). Liver Function Tests:  Recent Labs Lab 10/13/15 1244  AST 17  ALT 13*  ALKPHOS 77  BILITOT 0.3  PROT 5.8*  ALBUMIN 3.4*   No results for input(s): LIPASE, AMYLASE in the last 168 hours. No results for input(s): AMMONIA in the last 168 hours. Coagulation Profile:  Recent Labs Lab 10/13/15 1244  INR 1.12   Cardiac Enzymes: No results for input(s): CKTOTAL, CKMB, CKMBINDEX, TROPONINI in the last 168 hours. BNP (last 3 results) No results for input(s): PROBNP in the last 8760 hours. HbA1C:  Recent Labs  10/14/15 0532  HGBA1C 5.3   CBG:  Recent Labs Lab 10/15/15 0736  GLUCAP 105*   Lipid Profile:  Recent Labs  10/14/15 0532  CHOL 137  HDL 50  LDLCALC 71  TRIG 78  CHOLHDL 2.7   Thyroid Function Tests: No results for input(s): TSH, T4TOTAL, FREET4, T3FREE, THYROIDAB in the last 72 hours. Anemia Panel: No results for input(s): VITAMINB12, FOLATE, FERRITIN, TIBC, IRON,  RETICCTPCT in the last 72 hours. Urine analysis:    Component Value Date/Time   COLORURINE YELLOW 10/13/2015 1628   APPEARANCEUR CLEAR 10/13/2015 1628   LABSPEC 1.020 10/13/2015 1628   PHURINE 6.0 10/13/2015 1628   GLUCOSEU NEGATIVE 10/13/2015 1628   HGBUR NEGATIVE 10/13/2015 1628   BILIRUBINUR NEGATIVE 10/13/2015 1628   KETONESUR NEGATIVE 10/13/2015 1628   PROTEINUR NEGATIVE 10/13/2015 1628   UROBILINOGEN 1.0 01/11/2010 0818   NITRITE NEGATIVE 10/13/2015 1628   LEUKOCYTESUR SMALL (A) 10/13/2015 1628   Sepsis Labs: @LABRCNTIP (procalcitonin:4,lacticidven:4)   )No results found for this or any previous visit (from the past 240 hour(s)).    Radiology Studies: Ct Angio Head W Or Wo Contrast Addendum Date: 10/14/2015   ADDENDUM  REPORT: 10/14/2015 07:02 ADDENDUM: These results will be called to the ordering clinician or representative by the Radiologist Assistant, and communication documented in the PACS or zVision Dashboard. Electronically Signed   By: Kristine Garbe M.D.   On: 10/14/2015 07:02  Result Date: 10/14/2015 1. Right proximal A2 occlusion. 2. Extensive intracranial atherosclerosis in the anterior and posterior circulation with multiple areas of stenosis greatest in the right mid M1 segment where it is moderate to severe. 3. Otherwise no evidence for intracranial large vessel occlusion, aneurysm, or vascular malformation. 4. Grossly stable left sphenoid wing meningioma and adjacent vasogenic edema in the brain in comparison with prior MRI given differences in technique. 5. Bilateral carotid bifurcation calcific atherosclerosis with mild 40% left-sided proximal ICA stenosis. 6. Calcified plaque at bilateral vertebral artery origins without high-grade stenosis. Electronically Signed: By: Kristine Garbe M.D. On: 10/14/2015 05:13   Dg Chest 2 View Result Date: 10/13/2015 No acute cardiopulmonary abnormality. Stable cardiomegaly. Aortic atherosclerosis.  Electronically Signed   By: David  Martinique M.D.   On: 10/13/2015 15:31   Ct Head Wo Contrast Result Date: 10/13/2015 1. No acute intracranial abnormalities. 2. Stable encephalomalacia involving the right temporal lobe. 3. 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 4. Similar appearance of left sphenoid wing meningioma with brain edema involving the left temporal lobe. 5. Stable left posterior fossa meningioma. Electronically Signed   By: Kerby Moors M.D.   On: 10/13/2015 13:21   Ct Angio Neck W Or Wo Contrast Addendum Date: 10/14/2015   ADDENDUM REPORT: 10/14/2015 07:02 ADDENDUM: These results will be called to the ordering clinician or representative by the Radiologist Assistant, and communication documented in the PACS or zVision Dashboard. Electronically Signed   By: Kristine Garbe M.D.   On: 10/14/2015 07:02  Result Date: 10/14/2015 1. Right proximal A2 occlusion. 2. Extensive intracranial atherosclerosis in the anterior and posterior circulation with multiple areas of stenosis greatest in the right mid M1 segment where it is moderate to severe. 3. Otherwise no evidence for intracranial large vessel occlusion, aneurysm, or vascular malformation. 4. Grossly stable left sphenoid wing meningioma and adjacent vasogenic edema in the brain in comparison with prior MRI given differences in technique. 5. Bilateral carotid bifurcation calcific atherosclerosis with mild 40% left-sided proximal ICA stenosis. 6. Calcified plaque at bilateral vertebral artery origins without high-grade stenosis. Electronically Signed: By: Kristine Garbe M.D. On: 10/14/2015 05:13   Mr Brain Wo Contrast Result Date: 10/14/2015 1. Acute posterior left MCA territory infarct, primarily affecting the superior left temporal gyrus and a portion of the anterior left occipital lobe. No associated acute hemorrhage. 2. Developing cytotoxic edema related to #1 superimposed on chronic vasogenic edema in  the left temporal lobe related to the left sphenoid wing meningioma which appears grossly stable since March. No acute mass effect at this time. 3. Underlying chronic left middle MCA and posterior MCA ischemia. Superimposed chronic right MCA and right cerebellar infarcts. Electronically Signed   By: Genevie Ann M.D.   On: 10/14/2015 14:54      Scheduled Meds: . arformoterol  15 mcg Nebulization BID  . budesonide  0.25 mg Inhalation BID  . clopidogrel  75 mg Oral Daily  . enoxaparin (LOVENOX) injection  40 mg Subcutaneous Q24H  . furosemide  20 mg Intravenous Daily  . levETIRAcetam  500 mg Intravenous Q12H  . multivitamin with minerals  1 tablet Oral Daily  . NIFEdipine  60 mg Oral Daily  . PHENObarbital  90 mg Intravenous QHS  .  rosuvastatin  5 mg Oral QPM   Continuous Infusions:    LOS: 2 days    Time spent: 25 minutes  Greater than 50% of the time spent on counseling and coordinating the care.   Leisa Lenz, MD [facility] Pager [phone]  If 7PM-7AM, please contact night-coverage [email or web address] Password [email or web address] 10/16/2015, 1:04 PM

## 2015-10-16 NOTE — Progress Notes (Signed)
Occupational Therapy Treatment Patient Details Name: Arthur Wood MRN: [id number] DOB: 1931-04-15 Today's Date: 10/16/2015    History of present illness 82 y.o.malewith medical history significant of hypertension, atrial fibrillation, prior stroke with residual numbness to the right side, and seizures who presents with AMS. CT on 10/10 showed no acute intracranial abnormalities. MRI shows acute posterior L MCA territory infarct.  Pt also with hx of meningioma.   OT comments  Pt progressing towards acute OT goals. Focus of session was practicing functional transfers with pt completing bed to chair transfer using steady with +2 assist. Pt incontinent of bowels during session. Rest breaks incorporated. Pt would benefit from use of bari steady for transfers at this time as pt is able to stand with +2 A. Lethargic upon arrival of therapist but much more alert as session progressed.   Follow Up Recommendations  CIR;Supervision/Assistance - 24 hour    Equipment Recommendations  Other (comment)    Recommendations for Other Services      Precautions / Restrictions Precautions Precautions: Fall Precaution Comments: pt with significant LE and scrotal edema Restrictions Weight Bearing Restrictions: No       Mobility Bed Mobility Overal bed mobility: Needs Assistance;+2 for physical assistance Bed Mobility: Supine to Sit     Supine to sit: Max assist;HOB elevated     General bed mobility comments: use of bed pad to scoot to EOB  Transfers Overall transfer level: Needs assistance     Sit to Stand: +2 physical assistance;Mod assist;Max assist         General transfer comment: Assist to attain full upright stance. Pelvic cueing for anterior weight translation. Sit to stand performed 4 times. Pt requiring increased assist with fatigue.    Balance Overall balance assessment: Needs assistance   Sitting balance-Leahy Scale: Poor Sitting balance - Comments: right lean Postural  control: Right lateral lean Standing balance support: Bilateral upper extremity supported Standing balance-Leahy Scale: Zero Standing balance comment: heavy right lean                   ADL Overall ADL's : Needs assistance/impaired                             Toileting- Clothing Manipulation and Hygiene: Maximal assistance;+2 for physical assistance Toileting - Clothing Manipulation Details (indicate cue type and reason): total A for pericare with pt standing with +2 assist       General ADL Comments: Pt completed bed mobility and sit<>stands as part of transfer using steady. Much more alert today once sitting up. Pt incontinent of bowel during session, total assist for pericare in standing with rest break incorporated. Fatigued during session and needing rest breaks.       Vision                     Perception     Praxis      Cognition   Behavior During Therapy: Flat affect Overall Cognitive Status: Difficult to assess                  General Comments: aphasia    Extremity/Trunk Assessment               Exercises     Shoulder Instructions       General Comments      Pertinent Vitals/ Pain       Pain Assessment: Faces Faces Pain Scale: Hurts little more  Pain Location: groin/scrotal region with sitting Pain Descriptors / Indicators: Grimacing;Moaning Pain Intervention(s): Monitored during session;Repositioned  Home Living                                          Prior Functioning/Environment              Frequency  Min 3X/week        Progress Toward Goals  OT Goals(current goals can now be found in the care plan section)  Progress towards OT goals: Progressing toward goals  Acute Rehab OT Goals Patient Stated Goal: none stated Potential to Achieve Goals: Good ADL Goals Pt Will Perform Eating: sitting;bed level;with mod assist;with adaptive utensils Pt Will Perform Upper Body Bathing:  with mod assist;sitting Pt Will Perform Lower Body Bathing: with mod assist;sit to/from stand Pt Will Transfer to Toilet: with max assist;with +2 assist;stand pivot transfer;bedside commode Additional ADL Goal #1: Pt will complete bed mobility at max A +1 level to prepare for OOB ADLs. Additional ADL Goal #2: Pt will sit EOB at minguard level for 5 minutes to complete simple ADL task.  Plan Discharge plan remains appropriate    Co-evaluation    PT/OT/SLP Co-Evaluation/Treatment: Yes Reason for Co-Treatment: For patient/therapist safety;Necessary to address cognition/behavior during functional activity PT goals addressed during session: Mobility/safety with mobility OT goals addressed during session: ADL's and self-care      End of Session     Activity Tolerance Patient limited by fatigue;Patient limited by lethargy   Patient Left in chair;with call bell/phone within reach;with chair alarm set;with nursing/sitter in room   Nurse Communication Other (comment) (NT present during most of session. )        [id number] OT Time Calculation (min): 30 min  Charges: OT General Charges $OT Visit: 1 Procedure OT Treatments $Self Care/Home Management : 8-22 mins  Hortencia Pilar 10/16/2015, 10:36 AM

## 2015-10-16 NOTE — Clinical Social Work Note (Addendum)
Patient's son, [name], not in room. CSW called but voicemail not set up. Will try again later.  Dayton Scrape, CSW [phone]  11:56 am Received call back from patient's son. CSW notified him that patient was accepted at [facility]. Patient's son is in [place] and should arrive in [place] around 3:30 pm. He will call CSW when he gets here to discuss bed offers.  Dayton Scrape, [place]  3:47 pm CSW met with patient's son, [name]. He has accepted bed offer at [facility]. Admissions coordinator notified. He also inquired about information regarding DSS and the [facility] for his brother. CSW printed off information and provided to patient's son.   Dayton Scrape, [place]

## 2015-10-17 DIAGNOSIS — R569 Unspecified convulsions: Secondary | ICD-10-CM

## 2015-10-17 DIAGNOSIS — I639 Cerebral infarction, unspecified: Secondary | ICD-10-CM

## 2015-10-17 NOTE — Progress Notes (Signed)
Patient ID: Arthur Wood, male   DOB: 1931-11-22, 82 y.o.   MRN: [id number]  PROGRESS NOTE    Arthur Wood  [id number] DOB: December 04, 1931 DOA: 10/13/2015  PCP: Kandice Hams, MD   Brief Narrative:  82 year old male with past medical history significant for hypertension, prior stroke with residual numbness to the right side, seizures, atrial fibrillation who presented to [facility] with altered mental status, garbled speech, less interactive. Patient was subsequently found to have acute posterior left MCA territory infarct affecting the superior left temporal gyrus and a portion of the anterior left occipital lobe but no acute hemorrhage. He also was found to have developing cytotoxic edema related to acute infarction superimposed on chronic vasogenic edema in the left temporal lobe related to left sphenoid wing meningioma which seems to be stable since March. No mass effect. Patient does have chronic left middle MCA and posterior MCA ischemia and superimposed chronic right MCA and right cerebellar infarcts.  Assessment and plan:   Acute posterior left MCA / Cytotoxic edema in the left temporal lobe related to meningioma / Acute encephalopathy  - Plavix daily  - MRI brain / MRA brain - cute posterior left MCA territory infarct affecting the superior left temporal gyrus and a portion of the anterior left occipital lobe but no acute hemorrhage. He also was found to have developing cytotoxic edema related to acute infarction superimposed on chronic vasogenic edema in the left temporal lobe related to left sphenoid wing meningioma which seems to be stable since March. No mass effect. Patient does have chronic left middle MCA and posterior MCA ischemia and superimposed chronic right MCA and right cerebellar infarcts. - CT angio head and neck showed right proximal A2 occlusion. Extensive intracranial atherosclerosis in the anterior and posterior circulation with multiple areas of stenosis  greatest in the right mid M1 segment where it is moderate to severe. Grossly stable left sphenoid wing meningioma and adjacent vasogenic edema. - EEG showed diffuse slowing  - 2D ECHO - EF 65% - HgbA1c 5.3 - Lipid panel - LDL 71.  LDL goal < 100. Patient on Crestor  - Diet - dysphagia 1 - Therapy: PT/OT - SNF, appreciate SW assistance with discharge planning  - Appreciate neurology following   LE cellulitis right worse than left.  - Stopped doxycycline   Essential hypertension   - Continue lasix and procardia   Dyslipidemia - Continue Crestor   COPD - Continue brovana, Pulmicort - Stable resp status   Seizures - Continue with phenobarbital and keppra.  - EEG - showed diffuse slowing   Meningioma on CT - Stable since prior study in March    DVT prophylaxis: Lovenox subcutaneous Code Status: full code  Family Communication: No family at the bedside Disposition Plan: To SNF once bed available    Consultants:   PT  SLP  Inpatient rehab   Neurology   Procedures:   2 D ECHO 12/15/2015 - EF 65%, no regional wall motion abnormalities   EEG 10/14/2015 - diffuse slowing   Antimicrobials:   None    Subjective: No overnight events.   Objective: Vitals:   10/17/15 0500 10/17/15 0827 10/17/15 0848 10/17/15 0944  BP: 121/76   136/78  Pulse: 97   85  Resp: 17   20  Temp: 98.1 F (36.7 C)   97.9 F (36.6 C)  TempSrc: Axillary   Oral  SpO2: 98% 97% 98% 99%  Weight:        Intake/Output Summary (Last 24 hours)  at 10/17/15 1250 Last data filed at 10/17/15 1226  Gross per 24 hour  Intake              600 ml  Output                0 ml  Net              600 ml   Filed Weights   10/13/15 2028  Weight: 108.5 kg (239 lb 3.2 oz)    Examination:  General exam: no distress  Respiratory system: No wheezing, no rhonchi  Cardiovascular system: S1 & S2 heard, Rate controlled  Gastrointestinal system: (+) BS< non tender  Central nervous system: more  alert this am  Extremities: Palpable pulses bilaterally  Skin: warm, dry  Psychiatry: more alert  Data Reviewed: I have personally reviewed following labs and imaging studies  CBC:  Recent Labs Lab 10/13/15 1259 10/13/15 1343 10/15/15 0604  WBC  --  5.5 9.0  NEUTROABS  --  2.8  --   HGB 12.9* 12.7* 13.6  HCT 38.0* 38.4* 40.9  MCV  --  93.0 91.7  PLT  --  155 0000000   Basic Metabolic Panel:  Recent Labs Lab 10/13/15 1244 10/13/15 1259 10/15/15 0604  NA 143 144 139  K 3.9 3.9 3.8  CL 108 105 105  CO2 29  --  26  GLUCOSE 121* 113* 108*  BUN 17 21* 12  CREATININE 1.04 1.00 0.98  CALCIUM 8.7*  --  8.4*   GFR: CrCl cannot be calculated (Unknown ideal weight.). Liver Function Tests:  Recent Labs Lab 10/13/15 1244  AST 17  ALT 13*  ALKPHOS 77  BILITOT 0.3  PROT 5.8*  ALBUMIN 3.4*   No results for input(s): LIPASE, AMYLASE in the last 168 hours. No results for input(s): AMMONIA in the last 168 hours. Coagulation Profile:  Recent Labs Lab 10/13/15 1244  INR 1.12   Cardiac Enzymes: No results for input(s): CKTOTAL, CKMB, CKMBINDEX, TROPONINI in the last 168 hours. BNP (last 3 results) No results for input(s): PROBNP in the last 8760 hours. HbA1C: No results for input(s): HGBA1C in the last 72 hours. CBG:  Recent Labs Lab 10/15/15 0736  GLUCAP 105*   Lipid Profile: No results for input(s): CHOL, HDL, LDLCALC, TRIG, CHOLHDL, LDLDIRECT in the last 72 hours. Thyroid Function Tests: No results for input(s): TSH, T4TOTAL, FREET4, T3FREE, THYROIDAB in the last 72 hours. Anemia Panel: No results for input(s): VITAMINB12, FOLATE, FERRITIN, TIBC, IRON, RETICCTPCT in the last 72 hours. Urine analysis:    Component Value Date/Time   COLORURINE YELLOW 10/13/2015 1628   APPEARANCEUR CLEAR 10/13/2015 1628   LABSPEC 1.020 10/13/2015 1628   PHURINE 6.0 10/13/2015 1628   GLUCOSEU NEGATIVE 10/13/2015 1628   HGBUR NEGATIVE 10/13/2015 1628   BILIRUBINUR NEGATIVE  10/13/2015 1628   KETONESUR NEGATIVE 10/13/2015 1628   PROTEINUR NEGATIVE 10/13/2015 1628   UROBILINOGEN 1.0 01/11/2010 0818   NITRITE NEGATIVE 10/13/2015 1628   LEUKOCYTESUR SMALL (A) 10/13/2015 1628   Sepsis Labs: @LABRCNTIP (procalcitonin:4,lacticidven:4)   )No results found for this or any previous visit (from the past 240 hour(s)).    Radiology Studies: Ct Angio Head W Or Wo Contrast Addendum Date: 10/14/2015   ADDENDUM REPORT: 10/14/2015 07:02 ADDENDUM: These results will be called to the ordering clinician or representative by the Radiologist Assistant, and communication documented in the PACS or zVision Dashboard. Electronically Signed   By: Kristine Garbe M.D.   On: 10/14/2015 07:02  Result Date: 10/14/2015 1. Right proximal A2 occlusion. 2. Extensive intracranial atherosclerosis in the anterior and posterior circulation with multiple areas of stenosis greatest in the right mid M1 segment where it is moderate to severe. 3. Otherwise no evidence for intracranial large vessel occlusion, aneurysm, or vascular malformation. 4. Grossly stable left sphenoid wing meningioma and adjacent vasogenic edema in the brain in comparison with prior MRI given differences in technique. 5. Bilateral carotid bifurcation calcific atherosclerosis with mild 40% left-sided proximal ICA stenosis. 6. Calcified plaque at bilateral vertebral artery origins without high-grade stenosis. Electronically Signed: By: Kristine Garbe M.D. On: 10/14/2015 05:13   Dg Chest 2 View Result Date: 10/13/2015 No acute cardiopulmonary abnormality. Stable cardiomegaly. Aortic atherosclerosis. Electronically Signed   By: David  Martinique M.D.   On: 10/13/2015 15:31   Ct Head Wo Contrast Result Date: 10/13/2015 1. No acute intracranial abnormalities. 2. Stable encephalomalacia involving the right temporal lobe. 3. 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 4. Similar appearance of left sphenoid  wing meningioma with brain edema involving the left temporal lobe. 5. Stable left posterior fossa meningioma. Electronically Signed   By: Kerby Moors M.D.   On: 10/13/2015 13:21   Ct Angio Neck W Or Wo Contrast Addendum Date: 10/14/2015   ADDENDUM REPORT: 10/14/2015 07:02 ADDENDUM: These results will be called to the ordering clinician or representative by the Radiologist Assistant, and communication documented in the PACS or zVision Dashboard. Electronically Signed   By: Kristine Garbe M.D.   On: 10/14/2015 07:02  Result Date: 10/14/2015 1. Right proximal A2 occlusion. 2. Extensive intracranial atherosclerosis in the anterior and posterior circulation with multiple areas of stenosis greatest in the right mid M1 segment where it is moderate to severe. 3. Otherwise no evidence for intracranial large vessel occlusion, aneurysm, or vascular malformation. 4. Grossly stable left sphenoid wing meningioma and adjacent vasogenic edema in the brain in comparison with prior MRI given differences in technique. 5. Bilateral carotid bifurcation calcific atherosclerosis with mild 40% left-sided proximal ICA stenosis. 6. Calcified plaque at bilateral vertebral artery origins without high-grade stenosis. Electronically Signed: By: Kristine Garbe M.D. On: 10/14/2015 05:13   Mr Brain Wo Contrast Result Date: 10/14/2015 1. Acute posterior left MCA territory infarct, primarily affecting the superior left temporal gyrus and a portion of the anterior left occipital lobe. No associated acute hemorrhage. 2. Developing cytotoxic edema related to #1 superimposed on chronic vasogenic edema in the left temporal lobe related to the left sphenoid wing meningioma which appears grossly stable since March. No acute mass effect at this time. 3. Underlying chronic left middle MCA and posterior MCA ischemia. Superimposed chronic right MCA and right cerebellar infarcts. Electronically Signed   By: Genevie Ann M.D.   On:  10/14/2015 14:54      Scheduled Meds: . arformoterol  15 mcg Nebulization BID  . budesonide  0.25 mg Inhalation BID  . clopidogrel  75 mg Oral Daily  . enoxaparin (LOVENOX) injection  40 mg Subcutaneous Q24H  . furosemide  20 mg Intravenous Daily  . levETIRAcetam  500 mg Intravenous Q12H  . multivitamin with minerals  1 tablet Oral Daily  . NIFEdipine  60 mg Oral Daily  . PHENObarbital  90 mg Intravenous QHS  . rosuvastatin  5 mg Oral QPM   Continuous Infusions:    LOS: 3 days    Time spent: 15 minutes  Greater than 50% of the time spent on counseling and coordinating the care.   Saphire Barnhart,  MD [facility] Pager [phone]  If 7PM-7AM, please contact night-coverage [email or web address] Password [email or web address] 10/17/2015, 12:50 PM

## 2015-10-18 MED ORDER — LISINOPRIL 20 MG PO TABS: 20.0000 mg | ORAL_TABLET | Freq: Every day | ORAL | 0 refills | Status: DC

## 2015-10-18 NOTE — Discharge Instructions (Signed)
Aspiration Precautions °Aspiration is the breathing in (inhalation) of a liquid or object into the lungs. Things that can be inhaled into the lungs include:  °· Food. °· Any type of liquid, such as drinks or saliva. °· Stomach contents, such as vomit or stomach acid. °When these things go into the lungs, damage can occur and serious complications can result, such as: °· Lung infection (pneumonia). °· Collection of infected liquid (pus) in the lungs (lung abscess). °· Death. °CAUSES °The cause of aspiration may include:  °· A lowered level of awareness (consciousness) due to: °¨ Traumatic brain injury or head injury. °¨ Stroke. °¨ Diseases of the nerves, brain, or spinal cord. °¨ Seizures. °· A problem with the gag reflex. The gag reflex protects the body from swallowing things too quickly or things that are too large. °· Medical conditions that affect swallowing. °· Conditions that affect the food pipe (esophagus). °· Acid reflux. This is when stomach acid moves into the esophagus. °· Any type of surgery where a medicine to sleep (general anesthetic) or relax (sedative) is given. °· Alcohol abuse. °· Illegal drug abuse. °· Taking medicine that causes sleepiness, confusion, or weakness. °· Aging. °· Dental problems. °· Having a feeding tube. °SIGNS AND SYMPTOMS °Symptoms of aspiration may include:  °· Coughing after swallowing food or liquids. °· Difficulty breathing. This may include: °¨ Breathing quickly. °¨ Breathing very slowly. °¨ Loud breathing. °¨ Rumbling sounds from the lungs while breathing. °· Coughing up phlegm (sputum) that: °¨ Is yellow, tan, or green. °¨ Has pieces of food in it. °¨ Is bad smelling. °· A change in voice so that it sounds scratchy. °· A change in skin color. The skin may look red or blue.    °· Fever. °· Watery eyes. °· Pain in the chest or back. °· A pained look on the face.    °· A feeling of fullness in the throat or that something is stuck in the throat. °DIAGNOSIS °Aspiration may  be diagnosed by:  °· Chest X-ray. °· Bronchoscopy. This is a surgical procedure in which a thin, flexible tube with a camera is inserted into the nose or mouth to the lungs. The health care provider can then view the lungs. °· A swallowing evaluation study to find out: °¨ A person's risk of aspiration. °¨ How difficult it is for a person to swallow. °¨ What types of foods are safe for a person to eat. °PREVENTION °If you are caring for someone who can eat and drink through his or her mouth:  °· Have the person sit in an upright position when eating food or drinking fluids, such as: °¨ Sitting up in a chair. °¨ If sitting in a chair is not possible, position the person in bed so he or she is upright. °· Remind the person to eat slowly and chew well. °· Do not distract the person. This is especially important for people with thinking or memory (cognitive) problems. °· Check the person's mouth for leftover food after eating. °· Keep the person sitting upright for 30-45 minutes after eating. °· Do not serve food or drink for at least 2 hours before bedtime. °If you are caring for someone with a feeding tube who cannot eat or drink through his or her mouth: °· Keep the person in an upright position as much as possible. °· Do not  lay the person flat if he or she is getting continuous feedings. Turn the feeding pump off if you need to lay the person flat   for any reason. °· Check feeding tube residuals as directed by your health care provider. Ask your health care provider what residual amount is too high. °General guidelines to prevent aspiration in someone you are caring for include: °· Feed small amounts of food. Do not force feed. °· Food should be thickened as directed by the person's speech pathologist. °· Use as little water as possible when brushing the person's teeth or cleaning his or her mouth. °· Provide oral care before and after meals. °· Never put food or liquids in the mouth of a person who is not fully  alert. °· Crush pills and put them in soft food such as pudding or ice cream. Some pills should not be crushed. Check with your health care provider before crushing any medicine. °SEEK MEDICAL CARE IF: °· The person has a feeding tube and the feeding tube residual amount is too high. °· The person has a fever. °· The person tries to avoid food, such as refusing to eat or be fed, or is eating less than normal. °SEEK IMMEDIATE MEDICAL CARE IF:  °· The person has trouble breathing or starts to breathe quickly. °· The person is breathing very slowly or stops breathing. °· The person coughs a lot after eating or drinking. °· The person has a long-lasting (chronic) cough. °· The person coughs up thick, yellow, or tan sputum. °MAKE SURE YOU:  °· Understand these instructions. °· Will watch the person's condition. °· Will get help right away if the person is not doing well or gets worse. °  °This information is not intended to replace advice given to you by your health care provider. Make sure you discuss any questions you have with your health care provider. °  °Document Released: 01/22/2010 Document Revised: 01/10/2014 Document Reviewed: 03/27/2013 °[employer] Interactive Patient Education ©2016 [employer] ° °

## 2015-10-18 NOTE — Discharge Summary (Signed)
Physician Discharge Summary  Arthur Wood [id number] DOB: 1931-10-08 DOA: 10/13/2015  PCP: Kandice Hams, MD  Admit date: 10/13/2015 Discharge date: 10/18/2015  Recommendations for Outpatient Follow-up:  1. Continue Plavix daily. 2. CBC and BMP per skilled nursing facility protocol.  Discharge Diagnoses:  Active Problems:   Altered mental status   Hypertension   Asthma   Acute encephalopathy   Seizures (HCC)   H/O: CVA (cerebrovascular accident)   Confusion   Stroke ([place])   Meningioma (HCC)   Acute CVA (cerebrovascular accident) ([place])   Abnormality of gait and mobility   Aphasia due to recent stroke   Right hemiparesis [facility])    Discharge Condition: stable; spoke with patient's son at the bedside who agrees with discharge planning and has chosen [facility] facility  Diet recommendation: as tolerated   History of present illness:  82 year old male with past medical history significant for hypertension, prior stroke with residual numbness to the right side, seizures, atrial fibrillation who presented to [facility] with altered mental status, garbled speech, less interactive. Patient was subsequently found to have acute posterior left MCA territory infarct affecting the superior left temporal gyrus and a portion of the anterior left occipital lobe but no acute hemorrhage. He also was found to have developing cytotoxic edema related to acute infarction superimposed on chronic vasogenic edema in the left temporal lobe related to left sphenoid wing meningioma which seems to be stable since March. No mass effect. Patient does have chronic left middle MCA and posterior MCA ischemia and superimposed chronic right MCA and right cerebellar infarcts.  Hospital Course:    Assessment and plan:   Acute posterior left MCA / Cytotoxic edema in the left temporal lobe related to meningioma / Acute encephalopathy  - Plavix daily  - MRI brain / MRA brain - cute posterior left  MCA territory infarct affecting the superior left temporal gyrus and a portion of the anterior left occipital lobe but no acute hemorrhage. He also was found to have developing cytotoxic edema related to acute infarction superimposed on chronic vasogenic edema in the left temporal lobe related to left sphenoid wing meningioma which seems to be stable since March. No mass effect. Patient does have chronic left middle MCA and posterior MCA ischemia and superimposed chronic right MCA and right cerebellar infarcts. - CT angio head and neck showed right proximal A2 occlusion. Extensive intracranial atherosclerosis in the anterior and posterior circulation with multiple areas of stenosis greatest in the right mid M1 segment where it is moderate to severe. Grossly stable left sphenoid wing meningioma and adjacent vasogenic edema. - EEG showed diffuse slowing  - 2D ECHO - EF 65% - HgbA1c 5.3 - Lipid panel - LDL 71.  LDL goal < 100. Patient on Crestor  - Diet - dysphagia 1 - Therapy: PT/OT - SNF - Appreciate neurology following  LE cellulitis right worse than left.  - Stopped doxycycline   Essential hypertension   - Continue lasix and procardia   Dyslipidemia - Continue Crestor   COPD - Continue brovana, Pulmicort - Stable resp status   Seizures - Continue with phenobarbital and keppra.  - EEG - showed diffuse slowing   Meningioma on CT - Stable since prior study in March    DVT prophylaxis: Lovenox subcutaneous Code Status: full code  Family Communication: Son at the bedside [id number])    Consultants:   PT  SLP  Inpatient rehab   Neurology   Procedures:   2 D ECHO 12/15/2015 -  EF 65%, no regional wall motion abnormalities   EEG 10/14/2015 - diffuse slowing   Antimicrobials:   None     Signed:  Leisa Lenz, MD  [facility] 10/18/2015, 10:55 AM  Pager #: [phone]  Time spent in minutes: less than 30 minutes  Discharge  Exam: Vitals:   10/18/15 0547 10/18/15 1006  BP: (!) 142/75 (!) 140/56  Pulse: 90 89  Resp: 16 18  Temp: 98.4 F (36.9 C) 98.1 F (36.7 C)   Vitals:   10/18/15 0547 10/18/15 0814 10/18/15 0818 10/18/15 1006  BP: (!) 142/75   (!) 140/56  Pulse: 90   89  Resp: 16   18  Temp: 98.4 F (36.9 C)   98.1 F (36.7 C)  TempSrc: Axillary   Axillary  SpO2: 99% 97% 97% 99%  Weight:        General: Pt is not in acute distress Cardiovascular: Regular rate and rhythm, S1/S2 + Respiratory:  no wheezing, no crackles, no rhonchi Abdominal: Soft, non tender, non distended, bowel sounds +, no guarding Extremities: no cyanosis, pulses palpable bilaterally DP and PT Neuro: does not answer questions, he is awake but minimally verbal  Discharge Instructions  Discharge Instructions    Call MD for:  difficulty breathing, headache or visual disturbances    Complete by:  As directed    Call MD for:  persistant nausea and vomiting    Complete by:  As directed    Call MD for:  redness, tenderness, or signs of infection (pain, swelling, redness, odor or green/yellow discharge around incision site)    Complete by:  As directed    Call MD for:  severe uncontrolled pain    Complete by:  As directed    Diet - low sodium heart healthy    Complete by:  As directed    Increase activity slowly    Complete by:  As directed        Medication List    STOP taking these medications   cloNIDine 0.1 MG tablet Commonly known as:  CATAPRES     TAKE these medications   albuterol 108 (90 Base) MCG/ACT inhaler Commonly known as:  PROVENTIL HFA;VENTOLIN HFA Inhale 1-2 puffs into the lungs every 4 (four) hours as needed for wheezing or shortness of breath.   clopidogrel 75 MG tablet Commonly known as:  PLAVIX Take 75 mg by mouth daily.   furosemide 20 MG tablet Commonly known as:  LASIX Take 20 mg by mouth daily.   levETIRAcetam 500 MG tablet Commonly known as:  KEPPRA Take 1 tablet (500 mg total) by  mouth 2 (two) times daily.   lisinopril 20 MG tablet Commonly known as:  PRINIVIL,ZESTRIL Take 1 tablet (20 mg total) by mouth daily. What changed:  medication strength  how much to take   MULTIVITAMIN ADULT Chew Chew 2 each by mouth daily.   NIFEdipine 60 MG 24 hr tablet Commonly known as:  PROCARDIA XL/ADALAT-CC Take 1 tablet (60 mg total) by mouth daily.   PHENObarbital 60 MG tablet Commonly known as:  LUMINAL Take one and 1/2 tablet by mouth every evening What changed:  how much to take  how to take this  when to take this  additional instructions   QVAR 80 MCG/ACT inhaler Generic drug:  beclomethasone Inhale 1 puff into the lungs 2 (two) times daily.   rosuvastatin 5 MG tablet Commonly known as:  CRESTOR Take 5 mg by mouth every evening.   SEREVENT DISKUS 50  MCG/DOSE diskus inhaler Generic drug:  salmeterol Inhale 1 puff into the lungs daily.      Follow-up Information    Xu,Jindong, MD. Schedule an appointment as soon as possible for a visit in 6 week(s).   Specialty:  Neurology Contact information: [address] [place] [address] [phone]        Kandice Hams, MD. Schedule an appointment as soon as possible for a visit in 1 week(s).   Specialty:  Internal Medicine Contact information: [address] [place] [address] [phone]            The results of significant diagnostics from this hospitalization (including imaging, microbiology, ancillary and laboratory) are listed below for reference.    Significant Diagnostic Studies: Ct Angio Head W Or Wo Contrast  Addendum Date: 10/14/2015   ADDENDUM REPORT: 10/14/2015 07:02 ADDENDUM: These results will be called to the ordering clinician or representative by the Radiologist Assistant, and communication documented in the PACS or zVision Dashboard. Electronically Signed   By: Kristine Garbe M.D.   On: 10/14/2015 07:02   Result Date:  10/14/2015 CLINICAL DATA:  82 y/o M; woke this morning with garbled speech history history of prior stroke with residual numbness on the right side and seizures. EXAM: CT ANGIOGRAPHY HEAD AND NECK TECHNIQUE: Multidetector CT imaging of the head and neck was performed using the standard protocol during bolus administration of intravenous contrast. Multiplanar CT image reconstructions and MIPs were obtained to evaluate the vascular anatomy. Carotid stenosis measurements (when applicable) are obtained utilizing NASCET criteria, using the distal internal carotid diameter as the denominator. CONTRAST:  80 cc Isovue 370 COMPARISON:  10/13/2015 CT head.  03/15/2015 MRI head. FINDINGS: CTA NECK FINDINGS Aortic arch: Standard branching. Imaged portion shows no evidence of aneurysm or dissection. No significant stenosis of the major arch vessel origins. Right carotid system: No evidence of dissection, stenosis (50% or greater) or occlusion. Left carotid system: No evidence of dissection, stenosis (50% or greater) or occlusion. Calcific atherosclerosis of carotid bifurcation with proximal left ICA mild stenosis of 40%. Vertebral arteries: Left dominant. Calcific atherosclerosis of bilateral origins without high-grade stenosis. No evidence for dissection or occlusion. Skeleton: Moderate spondylosis of the cervical spine without high-grade canal stenosis or foraminal narrowing. Other neck: Multiple subcentimeter thyroid nodules. No lymphadenopathy or discrete cervical mass is otherwise identified in the neck. The aerodigestive tract is patent. Postinflammatory calcifications of palatini tonsils. Paranasal sinuses and mastoid air cells are normally aerated. Upper chest: Platelike atelectasis along the left major fissure. Review of the MIP images confirms the above findings CTA HEAD FINDINGS Anterior circulation: Right proximal A2 occlusion (series 406, image 18). Extensive calcific atherosclerosis of internal carotid arteries  and cavernous segment of terminus with mild to moderate underlying stenosis. Severe atherosclerotic irregularity of the bilateral M1 and proximal M2 segments with moderate to severe mid right M1 segment stenosis. Irregularity of the patent left anterior cerebral artery with moderate stenosis of proximal A2 segment. Posterior circulation: Mild calcific atherosclerosis of bilateral V4 segments and luminal irregularity of the basilar artery and proximal posterior cerebral arteries with short segments of mild stenosis. No occlusion, aneurysm, or vascular malformation is identified. Venous sinuses: As permitted by contrast timing, patent. Anatomic variants: Right fetal posterior cerebral artery. No left posterior communicating artery identified, likely hypoplastic or absent. Left larger than right A1 segments and large anterior communicating artery. Delayed phase: Left sphenoid wing meningioma is grossly stable in comparison with the prior MRI. There  is lucency in the adjacent left inferior frontal lobe probably representing associated vasogenic edema which is also stable. No new focus of abnormal enhancement of the brain parenchyma. Review of the MIP images confirms the above findings IMPRESSION: 1. Right proximal A2 occlusion. 2. Extensive intracranial atherosclerosis in the anterior and posterior circulation with multiple areas of stenosis greatest in the right mid M1 segment where it is moderate to severe. 3. Otherwise no evidence for intracranial large vessel occlusion, aneurysm, or vascular malformation. 4. Grossly stable left sphenoid wing meningioma and adjacent vasogenic edema in the brain in comparison with prior MRI given differences in technique. 5. Bilateral carotid bifurcation calcific atherosclerosis with mild 40% left-sided proximal ICA stenosis. 6. Calcified plaque at bilateral vertebral artery origins without high-grade stenosis. Electronically Signed: By: Kristine Garbe M.D. On: 10/14/2015  05:13   Dg Chest 2 View  Result Date: 10/13/2015 CLINICAL DATA:  Altered mental status. History of seizures, previous CVA, atrial fibrillation, asthma, former smoker, renal cell malignancy. EXAM: CHEST  2 VIEW COMPARISON:  Portable chest x-ray of March 15, 2015 FINDINGS: The right lung is well-expanded and clear. On the left mild elevation of the hemidiaphragm is again observed. There is no acute infiltrate. There is no pleural effusion or pneumothorax. The cardiac silhouette is enlarged. The pulmonary vascularity is normal. There is calcification in the wall of the aortic arch. There is stable soft tissue fullness in the paratracheal regions that is likely vascular. The bony thorax exhibits no acute abnormality. IMPRESSION: No acute cardiopulmonary abnormality. Stable cardiomegaly. Aortic atherosclerosis. Electronically Signed   By: David  Martinique M.D.   On: 10/13/2015 15:31   Ct Head Wo Contrast  Result Date: 10/13/2015 CLINICAL DATA:  Difficulty with speech. EXAM: CT HEAD WITHOUT CONTRAST TECHNIQUE: Contiguous axial images were obtained from the base of the skull through the vertex without intravenous contrast. COMPARISON:  03/15/2015 FINDINGS: Brain: Large area of encephalomalacia is again noted involving the right temporal lobe. Similar appearance of low-attenuation edema involving the left temporal lobe with overlying left sphenoid wing meningioma. Left parietal lobe encephalomalacia is again identified and appears unchanged, image 20 of series 201. CSF attenuation overlying the right frontal lobe is unchanged from previous exam measuring 11 mm in thickness, image 25 of series 201. 8 mm meningioma overlying the left cerebellar hemisphere is unchanged, image 5 of series 201. No evidence for acute brain infarct. No intracranial hemorrhage identified. Vascular: No hyperdense vessel or unexpected calcification. Skull: Previous right parietal craniotomy and left frontal craniotomy. Sinuses/Orbits: No acute  finding. Other: None. IMPRESSION: 1. No acute intracranial abnormalities. 2. Stable encephalomalacia involving the right temporal lobe. 3. 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 4. Similar appearance of left sphenoid wing meningioma with brain edema involving the left temporal lobe. 5. Stable left posterior fossa meningioma. Electronically Signed   By: Kerby Moors M.D.   On: 10/13/2015 13:21   Ct Angio Neck W Or Wo Contrast  Addendum Date: 10/14/2015   ADDENDUM REPORT: 10/14/2015 07:02 ADDENDUM: These results will be called to the ordering clinician or representative by the Radiologist Assistant, and communication documented in the PACS or zVision Dashboard. Electronically Signed   By: Kristine Garbe M.D.   On: 10/14/2015 07:02   Result Date: 10/14/2015 CLINICAL DATA:  82 y/o M; woke this morning with garbled speech history history of prior stroke with residual numbness on the right side and seizures. EXAM: CT ANGIOGRAPHY HEAD AND NECK TECHNIQUE: Multidetector CT imaging of the head and neck  was performed using the standard protocol during bolus administration of intravenous contrast. Multiplanar CT image reconstructions and MIPs were obtained to evaluate the vascular anatomy. Carotid stenosis measurements (when applicable) are obtained utilizing NASCET criteria, using the distal internal carotid diameter as the denominator. CONTRAST:  80 cc Isovue 370 COMPARISON:  10/13/2015 CT head.  03/15/2015 MRI head. FINDINGS: CTA NECK FINDINGS Aortic arch: Standard branching. Imaged portion shows no evidence of aneurysm or dissection. No significant stenosis of the major arch vessel origins. Right carotid system: No evidence of dissection, stenosis (50% or greater) or occlusion. Left carotid system: No evidence of dissection, stenosis (50% or greater) or occlusion. Calcific atherosclerosis of carotid bifurcation with proximal left ICA mild stenosis of 40%. Vertebral arteries: Left  dominant. Calcific atherosclerosis of bilateral origins without high-grade stenosis. No evidence for dissection or occlusion. Skeleton: Moderate spondylosis of the cervical spine without high-grade canal stenosis or foraminal narrowing. Other neck: Multiple subcentimeter thyroid nodules. No lymphadenopathy or discrete cervical mass is otherwise identified in the neck. The aerodigestive tract is patent. Postinflammatory calcifications of palatini tonsils. Paranasal sinuses and mastoid air cells are normally aerated. Upper chest: Platelike atelectasis along the left major fissure. Review of the MIP images confirms the above findings CTA HEAD FINDINGS Anterior circulation: Right proximal A2 occlusion (series 406, image 18). Extensive calcific atherosclerosis of internal carotid arteries and cavernous segment of terminus with mild to moderate underlying stenosis. Severe atherosclerotic irregularity of the bilateral M1 and proximal M2 segments with moderate to severe mid right M1 segment stenosis. Irregularity of the patent left anterior cerebral artery with moderate stenosis of proximal A2 segment. Posterior circulation: Mild calcific atherosclerosis of bilateral V4 segments and luminal irregularity of the basilar artery and proximal posterior cerebral arteries with short segments of mild stenosis. No occlusion, aneurysm, or vascular malformation is identified. Venous sinuses: As permitted by contrast timing, patent. Anatomic variants: Right fetal posterior cerebral artery. No left posterior communicating artery identified, likely hypoplastic or absent. Left larger than right A1 segments and large anterior communicating artery. Delayed phase: Left sphenoid wing meningioma is grossly stable in comparison with the prior MRI. There is lucency in the adjacent left inferior frontal lobe probably representing associated vasogenic edema which is also stable. No new focus of abnormal enhancement of the brain parenchyma. Review  of the MIP images confirms the above findings IMPRESSION: 1. Right proximal A2 occlusion. 2. Extensive intracranial atherosclerosis in the anterior and posterior circulation with multiple areas of stenosis greatest in the right mid M1 segment where it is moderate to severe. 3. Otherwise no evidence for intracranial large vessel occlusion, aneurysm, or vascular malformation. 4. Grossly stable left sphenoid wing meningioma and adjacent vasogenic edema in the brain in comparison with prior MRI given differences in technique. 5. Bilateral carotid bifurcation calcific atherosclerosis with mild 40% left-sided proximal ICA stenosis. 6. Calcified plaque at bilateral vertebral artery origins without high-grade stenosis. Electronically Signed: By: Kristine Garbe M.D. On: 10/14/2015 05:13   Mr Brain Wo Contrast  Result Date: 10/14/2015 CLINICAL DATA:  82 year old male with altered mental status, garbled speech on waking today. Proximal right A2 occlusion and moderate to severe right M1 stenosis on CTA head and neck earlier today. Left sphenoid wing meningioma with cavernous sinus invasion. Initial encounter. EXAM: MRI HEAD WITHOUT CONTRAST TECHNIQUE: Multiplanar, multiecho pulse sequences of the brain and surrounding structures were obtained without intravenous contrast. COMPARISON:  CTA head and neck 0411 hours today. Brain MRI 03/15/2015. FINDINGS: Brain: Confluent restricted diffusion in the posterior  left MCA territory affecting the left temporal lobe, especially the superior left temporal gyrus, and a portion of the anterior left occipital lobe. There is acute on chronic left temporal lobe edema related to the acute infarct, and also to the chronic left middle cranial fossa meningioma. No associated acute hemorrhage or acute intracranial mass effect. Chronic right temporal lobe encephalomalacia. Extensive chronic perivascular space dilatation in the deep gray matter nuclei. Chronic encephalomalacia and  hemosiderin in the left parietal lobe. Chronic left frontal operculum encephalomalacia. Small chronic right cerebellar lacunar infarct. Sequelae of bilateral craniotomies. No ventriculomegaly. No acute intracranial mass effect or hemorrhage. Partially empty sella. Negative cervicomedullary junction. The left sphenoid wing meningioma appears grossly stable since March in the absence of contrast (series 4, image 10). Vascular: Major intracranial vascular flow voids are stable since March with a degree of generalized intracranial artery dolichoectasia. Skull and upper cervical spine: Negative. Visualized bone marrow signal is within normal limits. Sinuses/Orbits: Orbit and scalp soft tissues appear stable. Stable well pneumatized paranasal sinuses. Other: Visible internal auditory structures appear normal. Mastoids are clear. IMPRESSION: 1. Acute posterior left MCA territory infarct, primarily affecting the superior left temporal gyrus and a portion of the anterior left occipital lobe. No associated acute hemorrhage. 2. Developing cytotoxic edema related to #1 superimposed on chronic vasogenic edema in the left temporal lobe related to the left sphenoid wing meningioma which appears grossly stable since March. No acute mass effect at this time. 3. Underlying chronic left middle MCA and posterior MCA ischemia. Superimposed chronic right MCA and right cerebellar infarcts. Electronically Signed   By: Genevie Ann M.D.   On: 10/14/2015 14:54    Microbiology: No results found for this or any previous visit (from the past 240 hour(s)).   Labs: Basic Metabolic Panel:  Recent Labs Lab 10/13/15 1244 10/13/15 1259 10/15/15 0604  NA 143 144 139  K 3.9 3.9 3.8  CL 108 105 105  CO2 29  --  26  GLUCOSE 121* 113* 108*  BUN 17 21* 12  CREATININE 1.04 1.00 0.98  CALCIUM 8.7*  --  8.4*   Liver Function Tests:  Recent Labs Lab 10/13/15 1244  AST 17  ALT 13*  ALKPHOS 77  BILITOT 0.3  PROT 5.8*  ALBUMIN 3.4*    No results for input(s): LIPASE, AMYLASE in the last 168 hours. No results for input(s): AMMONIA in the last 168 hours. CBC:  Recent Labs Lab 10/13/15 1259 10/13/15 1343 10/15/15 0604  WBC  --  5.5 9.0  NEUTROABS  --  2.8  --   HGB 12.9* 12.7* 13.6  HCT 38.0* 38.4* 40.9  MCV  --  93.0 91.7  PLT  --  155 175   Cardiac Enzymes: No results for input(s): CKTOTAL, CKMB, CKMBINDEX, TROPONINI in the last 168 hours. BNP: BNP (last 3 results) No results for input(s): BNP in the last 8760 hours.  ProBNP (last 3 results) No results for input(s): PROBNP in the last 8760 hours.  CBG:  Recent Labs Lab 10/15/15 0736  GLUCAP 105*

## 2015-10-18 NOTE — Clinical Social Work Placement (Signed)
   CLINICAL SOCIAL WORK PLACEMENT  NOTE  Date:  10/18/2015  Patient Details  Name: Arthur Wood MRN: [id number] Date of Birth: 1931/01/21  Clinical Social Work is seeking post-discharge placement for this patient at the [place] level of care (*CSW will initial, date and re-position this form in  chart as items are completed):  Yes   Patient/family provided with [place] Work Department's list of facilities offering this level of care within the geographic area requested by the patient (or if unable, by the patient's family).  Yes   Patient/family informed of their freedom to choose among providers that offer the needed level of care, that participate in Medicare, Medicaid or managed care program needed by the patient, have an available bed and are willing to accept the patient.  Yes   Patient/family informed of [facility]'s ownership interest in [facility] and [facility], as well as of the fact that they are under no obligation to receive care at these facilities.  PASRR submitted to EDS on       PASRR number received on       Existing PASRR number confirmed on 10/15/15     FL2 transmitted to all facilities in geographic area requested by pt/family on 10/15/15     FL2 transmitted to all facilities within larger geographic area on       Patient informed that his/her managed care company has contracts with or will negotiate with certain facilities, including the following:            Patient/family informed of bed offers received.  Patient chooses bed at     [facility]  Physician recommends and patient chooses bed at      Patient to be transferred to   on  .10/18/15  Patient to be transferred to facility by     [facility]  Patient family notified on   of transfer.10/18/15  Name of family member notified:   [name] son.  PHYSICIAN       Additional Comment:    _______________________________________________ Roanna Raider,  LCSW 10/18/2015, 2:21 PM

## 2015-10-18 NOTE — Progress Notes (Signed)
Patient is being discharged to the skilled nursing facility. Report called to the receiving nurse.

## 2015-10-18 NOTE — Progress Notes (Signed)
Called social to notify that patient is being d/c to a skilled Nursing facility.

## 2015-10-18 NOTE — Progress Notes (Signed)
Pt to be transported to [facility] this pm via [facility] ambulance.  Son, at bedside, aware and agreeable to transfer.  Creta Levin, LCSW Weekend Coverage [phone]

## 2015-10-19 ENCOUNTER — Non-Acute Institutional Stay (SKILLED_NURSING_FACILITY): Payer: Medicare Other | Admitting: Internal Medicine

## 2015-10-19 ENCOUNTER — Encounter: Payer: Self-pay | Admitting: Internal Medicine

## 2015-10-19 DIAGNOSIS — D329 Benign neoplasm of meninges, unspecified: Secondary | ICD-10-CM

## 2015-10-19 DIAGNOSIS — G936 Cerebral edema: Secondary | ICD-10-CM | POA: Diagnosis not present

## 2015-10-19 DIAGNOSIS — L03115 Cellulitis of right lower limb: Secondary | ICD-10-CM

## 2015-10-19 DIAGNOSIS — G9340 Encephalopathy, unspecified: Secondary | ICD-10-CM | POA: Diagnosis not present

## 2015-10-19 DIAGNOSIS — E785 Hyperlipidemia, unspecified: Secondary | ICD-10-CM

## 2015-10-19 DIAGNOSIS — I10 Essential (primary) hypertension: Secondary | ICD-10-CM | POA: Diagnosis not present

## 2015-10-19 DIAGNOSIS — I639 Cerebral infarction, unspecified: Secondary | ICD-10-CM | POA: Diagnosis not present

## 2015-10-19 DIAGNOSIS — J449 Chronic obstructive pulmonary disease, unspecified: Secondary | ICD-10-CM

## 2015-10-19 NOTE — Progress Notes (Signed)
: Provider:  Noah Delaine. Sheppard Coil, MD Location:  [place] Room Number: [phone] Place of Service:  SNF [phone])  PCP: Kandice Hams, MD Patient Care Team: Seward Carol, MD as PCP - General (Internal Medicine)  Extended Emergency Contact Information Primary Emergency Contact: [place] [employer] Mobile Phone: [phone] Relation: None     Allergies: Review of patient's allergies indicates no known allergies.  Chief Complaint  Patient presents with  . New Admit To SNF    Admit to Facility    HPI: Patient is 82 y.o. male with hypertension, prior stroke with residual numbness to the right side, seizures, atrial fibrillation who presented to [facility] with altered mental status, garbled speech, less interactive. Pt was admitted to [facility] from 10/10-15 where pt was diagnosed with acute posterior left MCA territory infarct affecting the superior left temporal gyrus and a portion of the anterior left occipital lobe.Pt was left on plavix and all risk factors were evaluated. Hospital course was complicated by BLE cellulitis, tx with oral doxy.Pt is admitted to SNF for OT/PT. While at SNF pt will be followed for HTN, tx with lasix, lisinopril and nifedipine, seizure prophylaxis, tx with Keppra and phenobarbital and COPD, tx withprn albuterol, QVAR MDI and Serevent diskus.  Past Medical History:  Diagnosis Date  . Asthma   . Atrial fibrillation ([place])   . Hypertension   . lt renal ca dx'd 03/2010  . Seizures ([place])   . Stroke [facility])     Past Surgical History:  Procedure Laterality Date  . CRANIOTOMY  1981   bilateral crani for "stroke"  . TONSILLECTOMY AND ADENOIDECTOMY        Medication List       Accurate as of 10/19/15 10:37 AM. Always use your most recent med list.          albuterol 108 (90 Base) MCG/ACT inhaler Commonly known as:  PROVENTIL HFA;VENTOLIN HFA Inhale 1-2 puffs into the lungs every 4 (four) hours as  needed for wheezing or shortness of breath.   clopidogrel 75 MG tablet Commonly known as:  PLAVIX Take 75 mg by mouth daily.   furosemide 20 MG tablet Commonly known as:  LASIX Take 20 mg by mouth daily.   levETIRAcetam 500 MG tablet Commonly known as:  KEPPRA Take 1 tablet (500 mg total) by mouth 2 (two) times daily.   lisinopril 20 MG tablet Commonly known as:  PRINIVIL,ZESTRIL Take 1 tablet (20 mg total) by mouth daily.   MULTIVITAMIN ADULT Chew Chew 2 each by mouth daily.   NIFEdipine 60 MG 24 hr tablet Commonly known as:  PROCARDIA XL/ADALAT-CC Take 1 tablet (60 mg total) by mouth daily.   PHENObarbital 60 MG tablet Commonly known as:  LUMINAL Take one and 1/2 tablet by mouth every evening   QVAR 80 MCG/ACT inhaler Generic drug:  beclomethasone Inhale 1 puff into the lungs 2 (two) times daily.   rosuvastatin 5 MG tablet Commonly known as:  CRESTOR Take 5 mg by mouth every evening.   SEREVENT DISKUS 50 MCG/DOSE diskus inhaler Generic drug:  salmeterol Inhale 1 puff into the lungs daily.       No orders of the defined types were placed in this encounter.   Immunization History  Administered Date(s) Administered  . PPD Test 03/19/2015    Social History  Substance Use Topics  . Smoking status: Former Smoker    Types: Cigarettes    Quit date: 02/11/1983  .  Smokeless tobacco: Former Systems developer     Comment: quit smoking about 32 years ago.   . Alcohol use No    Family history is   Family History  Problem Relation Age of Onset  . Stroke Mother   . Depression Father   . Suicidality Father       Review of Systems  UTO 2/2 confusion   Vitals:   10/19/15 0910  BP: 131/83  Pulse: 70  Resp: 18  Temp: 97.5 F (36.4 C)    SpO2 Readings from Last 1 Encounters:  10/18/15 98%   Body mass index is 34.79 kg/m.     Physical Exam  GENERAL APPEARANCE: Alert, mod conversant,  No acute distress.  SKIN: No diaphoresis rash HEAD: Normocephalic,  atraumatic  EYES: Conjunctiva/lids clear. Pupils round, reactive. EOMs intact.  EARS: External exam WNL, canals clear. Hearing grossly normal.  NOSE: No deformity or discharge.  MOUTH/THROAT: Lips w/o lesions  RESPIRATORY: Breathing is even, unlabored. Lung sounds are clear   CARDIOVASCULAR: Heart RRR no murmurs, rubs or gallops. No peripheral edema.   GASTROINTESTINAL: Abdomen is soft, non-tender, not distended w/ normal bowel sounds. GENITOURINARY: Bladder non tender, not distended  MUSCULOSKELETAL: No abnormal joints or musculature NEUROLOGIC:  Cranial nerves 2-12 grossly intact.R side weakness PSYCHIATRIC: confusion, no behavioral issues  Patient Active Problem List   Diagnosis Date Noted  . Abnormality of gait and mobility   . Aphasia due to recent stroke   . Right hemiparesis ([place])   . Acute CVA (cerebrovascular accident) ([place]) 10/14/2015  . Stroke ([place])   . Meningioma ([place])   . Confusion 10/13/2015  . H/O: CVA (cerebrovascular accident) 04/08/2015  . Seizures ([place]) 03/28/2015  . Hypertensive emergency 03/28/2015  . Hyperlipidemia 03/28/2015  . Altered mental status 03/15/2015  . Paroxysmal atrial fibrillation ([place]) 03/15/2015  . Hypertension 03/15/2015  . Asthma 03/15/2015  . Acute encephalopathy 03/15/2015  . Renal mass, left       Labs reviewed: Basic Metabolic Panel:    Component Value Date/Time   NA 139 10/15/2015 1259   K 3.8 10/15/2015 0604   CL 105 10/15/2015 0604   CO2 26 10/15/2015 0604   GLUCOSE 108 (H) 10/15/2015 0604   BUN 12 10/15/2015 1259   CREATININE 1.0 10/15/2015 1259   CREATININE 0.98 10/15/2015 0604   CREATININE 0.87 03/31/2014 1805   CALCIUM 8.4 (L) 10/15/2015 0604   PROT 5.8 (L) 10/13/2015 1244   ALBUMIN 3.4 (L) 10/13/2015 1244   AST 17 10/13/2015 1244   ALT 13 (L) 10/13/2015 1244   ALKPHOS 77 10/13/2015 1244   BILITOT 0.3 10/13/2015 1244   GFRNONAA >60 10/15/2015 0604   GFRNONAA 82 03/31/2014 1805   GFRAA >60 10/15/2015 0604    GFRAA >89 03/31/2014 1805     Recent Labs  03/19/15 0530  10/13/15 1244 10/13/15 1259 10/15/15 0604 10/15/15 1259  NA 143  < > 143 144  144 139 139  K 4.1  --  3.9 3.9  3.9 3.8  --   CL 106  --  108 105 105  --   CO2 29  --  29  --  26  --   GLUCOSE 101*  --  121* 113* 108*  --   BUN 17  < > 17 21  21* 12 12  CREATININE 1.10  < > 1.04 1.00  1.0 0.98 1.0  CALCIUM 8.9  --  8.7*  --  8.4*  --   < > = values in this  interval not displayed. Liver Function Tests:  Recent Labs  03/15/15 0923 03/19/15 0530 10/13/15 1244  AST 19 20 17   ALT 15* 18 13*  ALKPHOS 81 82 77  BILITOT 0.7 0.4 0.3  PROT 6.2* 6.5 5.8*  ALBUMIN 3.7 3.5 3.4*   No results for input(s): LIPASE, AMYLASE in the last 8760 hours.  Recent Labs  03/15/15 0451  AMMONIA 26   CBC:  Recent Labs  03/15/15 0434  03/19/15 0530  10/13/15 1259 10/13/15 1343 10/15/15 0604 10/15/15 1259  WBC 5.9  < > 5.9  < >  --  5.5 9.0 9.0  NEUTROABS 3.0  --   --   --   --  2.8  --   --   HGB 13.7  < > 13.3  --  12.9* 12.7* 13.6  --   HCT 41.9  < > 40.5  --  38.0* 38.4* 40.9  --   MCV 91.9  < > 92.7  --   --  93.0 91.7  --   PLT 166  < > 171  --   --  155 175  --   < > = values in this interval not displayed. Lipid  Recent Labs  10/14/15 0532  CHOL 137  HDL 50  LDLCALC 71  TRIG 78    Cardiac Enzymes:  Recent Labs  03/15/15 0434  TROPONINI <0.03   BNP: No results for input(s): BNP in the last 8760 hours. No results found for: [facility] Lab Results  Component Value Date   HGBA1C 5.3 10/14/2015   Lab Results  Component Value Date   TSH 2.490 03/15/2015   Lab Results  Component Value Date   [id number] 03/15/2015   No results found for: FOLATE No results found for: IRON, TIBC, FERRITIN  Imaging and Procedures obtained prior to SNF admission: Ct Angio Head W Or Wo Contrast  Addendum Date: 10/14/2015   ADDENDUM REPORT: 10/14/2015 07:02 ADDENDUM: These results will be called to the  ordering clinician or representative by the Radiologist Assistant, and communication documented in the PACS or zVision Dashboard. Electronically Signed   By: Kristine Garbe M.D.   On: 10/14/2015 07:02   Result Date: 10/14/2015 CLINICAL DATA:  82 y/o M; woke this morning with garbled speech history history of prior stroke with residual numbness on the right side and seizures. EXAM: CT ANGIOGRAPHY HEAD AND NECK TECHNIQUE: Multidetector CT imaging of the head and neck was performed using the standard protocol during bolus administration of intravenous contrast. Multiplanar CT image reconstructions and MIPs were obtained to evaluate the vascular anatomy. Carotid stenosis measurements (when applicable) are obtained utilizing NASCET criteria, using the distal internal carotid diameter as the denominator. CONTRAST:  80 cc Isovue 370 COMPARISON:  10/13/2015 CT head.  03/15/2015 MRI head. FINDINGS: CTA NECK FINDINGS Aortic arch: Standard branching. Imaged portion shows no evidence of aneurysm or dissection. No significant stenosis of the major arch vessel origins. Right carotid system: No evidence of dissection, stenosis (50% or greater) or occlusion. Left carotid system: No evidence of dissection, stenosis (50% or greater) or occlusion. Calcific atherosclerosis of carotid bifurcation with proximal left ICA mild stenosis of 40%. Vertebral arteries: Left dominant. Calcific atherosclerosis of bilateral origins without high-grade stenosis. No evidence for dissection or occlusion. Skeleton: Moderate spondylosis of the cervical spine without high-grade canal stenosis or foraminal narrowing. Other neck: Multiple subcentimeter thyroid nodules. No lymphadenopathy or discrete cervical mass is otherwise identified in the neck. The aerodigestive tract is patent. Postinflammatory calcifications  of palatini tonsils. Paranasal sinuses and mastoid air cells are normally aerated. Upper chest: Platelike atelectasis along the  left major fissure. Review of the MIP images confirms the above findings CTA HEAD FINDINGS Anterior circulation: Right proximal A2 occlusion (series 406, image 18). Extensive calcific atherosclerosis of internal carotid arteries and cavernous segment of terminus with mild to moderate underlying stenosis. Severe atherosclerotic irregularity of the bilateral M1 and proximal M2 segments with moderate to severe mid right M1 segment stenosis. Irregularity of the patent left anterior cerebral artery with moderate stenosis of proximal A2 segment. Posterior circulation: Mild calcific atherosclerosis of bilateral V4 segments and luminal irregularity of the basilar artery and proximal posterior cerebral arteries with short segments of mild stenosis. No occlusion, aneurysm, or vascular malformation is identified. Venous sinuses: As permitted by contrast timing, patent. Anatomic variants: Right fetal posterior cerebral artery. No left posterior communicating artery identified, likely hypoplastic or absent. Left larger than right A1 segments and large anterior communicating artery. Delayed phase: Left sphenoid wing meningioma is grossly stable in comparison with the prior MRI. There is lucency in the adjacent left inferior frontal lobe probably representing associated vasogenic edema which is also stable. No new focus of abnormal enhancement of the brain parenchyma. Review of the MIP images confirms the above findings IMPRESSION: 1. Right proximal A2 occlusion. 2. Extensive intracranial atherosclerosis in the anterior and posterior circulation with multiple areas of stenosis greatest in the right mid M1 segment where it is moderate to severe. 3. Otherwise no evidence for intracranial large vessel occlusion, aneurysm, or vascular malformation. 4. Grossly stable left sphenoid wing meningioma and adjacent vasogenic edema in the brain in comparison with prior MRI given differences in technique. 5. Bilateral carotid bifurcation  calcific atherosclerosis with mild 40% left-sided proximal ICA stenosis. 6. Calcified plaque at bilateral vertebral artery origins without high-grade stenosis. Electronically Signed: By: Kristine Garbe M.D. On: 10/14/2015 05:13   Ct Angio Neck W Or Wo Contrast  Addendum Date: 10/14/2015   ADDENDUM REPORT: 10/14/2015 07:02 ADDENDUM: These results will be called to the ordering clinician or representative by the Radiologist Assistant, and communication documented in the PACS or zVision Dashboard. Electronically Signed   By: Kristine Garbe M.D.   On: 10/14/2015 07:02   Result Date: 10/14/2015 CLINICAL DATA:  82 y/o M; woke this morning with garbled speech history history of prior stroke with residual numbness on the right side and seizures. EXAM: CT ANGIOGRAPHY HEAD AND NECK TECHNIQUE: Multidetector CT imaging of the head and neck was performed using the standard protocol during bolus administration of intravenous contrast. Multiplanar CT image reconstructions and MIPs were obtained to evaluate the vascular anatomy. Carotid stenosis measurements (when applicable) are obtained utilizing NASCET criteria, using the distal internal carotid diameter as the denominator. CONTRAST:  80 cc Isovue 370 COMPARISON:  10/13/2015 CT head.  03/15/2015 MRI head. FINDINGS: CTA NECK FINDINGS Aortic arch: Standard branching. Imaged portion shows no evidence of aneurysm or dissection. No significant stenosis of the major arch vessel origins. Right carotid system: No evidence of dissection, stenosis (50% or greater) or occlusion. Left carotid system: No evidence of dissection, stenosis (50% or greater) or occlusion. Calcific atherosclerosis of carotid bifurcation with proximal left ICA mild stenosis of 40%. Vertebral arteries: Left dominant. Calcific atherosclerosis of bilateral origins without high-grade stenosis. No evidence for dissection or occlusion. Skeleton: Moderate spondylosis of the cervical spine  without high-grade canal stenosis or foraminal narrowing. Other neck: Multiple subcentimeter thyroid nodules. No lymphadenopathy or discrete cervical mass is  otherwise identified in the neck. The aerodigestive tract is patent. Postinflammatory calcifications of palatini tonsils. Paranasal sinuses and mastoid air cells are normally aerated. Upper chest: Platelike atelectasis along the left major fissure. Review of the MIP images confirms the above findings CTA HEAD FINDINGS Anterior circulation: Right proximal A2 occlusion (series 406, image 18). Extensive calcific atherosclerosis of internal carotid arteries and cavernous segment of terminus with mild to moderate underlying stenosis. Severe atherosclerotic irregularity of the bilateral M1 and proximal M2 segments with moderate to severe mid right M1 segment stenosis. Irregularity of the patent left anterior cerebral artery with moderate stenosis of proximal A2 segment. Posterior circulation: Mild calcific atherosclerosis of bilateral V4 segments and luminal irregularity of the basilar artery and proximal posterior cerebral arteries with short segments of mild stenosis. No occlusion, aneurysm, or vascular malformation is identified. Venous sinuses: As permitted by contrast timing, patent. Anatomic variants: Right fetal posterior cerebral artery. No left posterior communicating artery identified, likely hypoplastic or absent. Left larger than right A1 segments and large anterior communicating artery. Delayed phase: Left sphenoid wing meningioma is grossly stable in comparison with the prior MRI. There is lucency in the adjacent left inferior frontal lobe probably representing associated vasogenic edema which is also stable. No new focus of abnormal enhancement of the brain parenchyma. Review of the MIP images confirms the above findings IMPRESSION: 1. Right proximal A2 occlusion. 2. Extensive intracranial atherosclerosis in the anterior and posterior circulation with  multiple areas of stenosis greatest in the right mid M1 segment where it is moderate to severe. 3. Otherwise no evidence for intracranial large vessel occlusion, aneurysm, or vascular malformation. 4. Grossly stable left sphenoid wing meningioma and adjacent vasogenic edema in the brain in comparison with prior MRI given differences in technique. 5. Bilateral carotid bifurcation calcific atherosclerosis with mild 40% left-sided proximal ICA stenosis. 6. Calcified plaque at bilateral vertebral artery origins without high-grade stenosis. Electronically Signed: By: Kristine Garbe M.D. On: 10/14/2015 05:13   Mr Brain Wo Contrast  Result Date: 10/14/2015 CLINICAL DATA:  82 year old male with altered mental status, garbled speech on waking today. Proximal right A2 occlusion and moderate to severe right M1 stenosis on CTA head and neck earlier today. Left sphenoid wing meningioma with cavernous sinus invasion. Initial encounter. EXAM: MRI HEAD WITHOUT CONTRAST TECHNIQUE: Multiplanar, multiecho pulse sequences of the brain and surrounding structures were obtained without intravenous contrast. COMPARISON:  CTA head and neck 0411 hours today. Brain MRI 03/15/2015. FINDINGS: Brain: Confluent restricted diffusion in the posterior left MCA territory affecting the left temporal lobe, especially the superior left temporal gyrus, and a portion of the anterior left occipital lobe. There is acute on chronic left temporal lobe edema related to the acute infarct, and also to the chronic left middle cranial fossa meningioma. No associated acute hemorrhage or acute intracranial mass effect. Chronic right temporal lobe encephalomalacia. Extensive chronic perivascular space dilatation in the deep gray matter nuclei. Chronic encephalomalacia and hemosiderin in the left parietal lobe. Chronic left frontal operculum encephalomalacia. Small chronic right cerebellar lacunar infarct. Sequelae of bilateral craniotomies. No  ventriculomegaly. No acute intracranial mass effect or hemorrhage. Partially empty sella. Negative cervicomedullary junction. The left sphenoid wing meningioma appears grossly stable since March in the absence of contrast (series 4, image 10). Vascular: Major intracranial vascular flow voids are stable since March with a degree of generalized intracranial artery dolichoectasia. Skull and upper cervical spine: Negative. Visualized bone marrow signal is within normal limits. Sinuses/Orbits: Orbit and scalp soft tissues appear  stable. Stable well pneumatized paranasal sinuses. Other: Visible internal auditory structures appear normal. Mastoids are clear. IMPRESSION: 1. Acute posterior left MCA territory infarct, primarily affecting the superior left temporal gyrus and a portion of the anterior left occipital lobe. No associated acute hemorrhage. 2. Developing cytotoxic edema related to #1 superimposed on chronic vasogenic edema in the left temporal lobe related to the left sphenoid wing meningioma which appears grossly stable since March. No acute mass effect at this time. 3. Underlying chronic left middle MCA and posterior MCA ischemia. Superimposed chronic right MCA and right cerebellar infarcts. Electronically Signed   By: Genevie Ann M.D.   On: 10/14/2015 14:54     Not all labs, radiology exams or other studies done during hospitalization come through on my EPIC note; however they are reviewed by me.    Assessment and Plan   ACUTE POST L MCA STROKE/CYTOTOXIC EDEMA IN L TEMPORAL LOBE 2/2MENINGIOMA/ ACCUTE ENCEPHALOPATHY - MRI  acute posterior left MCA territory infarct affecting the superior left temporal gyrus and a portion of the anterior left occipital lobe but no acute hemorrhage. He also was found to have developing cytotoxic edema related to acute infarction superimposed on chronic vasogenic edema in the left temporal lobe related to left sphenoid wing meningioma which seems to be stable since March. No  mass effect. Patient does have chronic left middle MCA and posterior MCA ischemia and superimposed chronic right MCA and right cerebellar infarcts. CTA -  Extensive intracranial atherosclerosis in the anterior and posterior circulation with multiple areas of stenosis greatest in the right mid M1 segment where it is moderate to severeEEG showed diffuse slowing  - 2D ECHO - EF 65% - HgbA1c 5.3 - Lipid panel - LDL 71. LDL goal <100 - Diet - dysphagia 1 SNF - Cont dysphagia 1 diet, plavix 75 mg daily and crestor 5 mg daily  LE CELLULITIS - doxy reported as stopped apparently resolved SNF - will monitor  HTN SNF - cont lasix 20 mg daily, lisinopril 20 mg daily and procardia 60 mg 24 hr daily  HLD SNF - LDL 71 on crestor 5 mg; cont crestor 5 mg daily  COPD SNF-  Cont QVAR 80 mcg 1 puff BID and serevent diskus 50 mcg 1 puff daily  SEIZURES SNF  - NON REPORTED;CONT KEPPRA 500 MG BID and phenobarb 60 mg 1 and 1/2 tab qHS  MENINGIOMA - STABLE SINCE MARCH 2017    Time spent > 45 min;> 50% of time with patient was spent reviewing records, labs, tests and studies, counseling and developing plan of care  Webb Silversmith D. Sheppard Coil, MD

## 2015-10-20 DIAGNOSIS — L03119 Cellulitis of unspecified part of limb: Secondary | ICD-10-CM | POA: Insufficient documentation

## 2015-10-20 DIAGNOSIS — G936 Cerebral edema: Secondary | ICD-10-CM | POA: Insufficient documentation

## 2015-10-20 DIAGNOSIS — J449 Chronic obstructive pulmonary disease, unspecified: Secondary | ICD-10-CM | POA: Insufficient documentation

## 2015-10-26 LAB — BASIC METABOLIC PANEL
BUN: 23 mg/dL — AB (ref 4–21)
CREATININE: 0.8 mg/dL (ref 0.6–1.3)
Glucose: 109 mg/dL
POTASSIUM: 4.3 mmol/L (ref 3.4–5.3)
Sodium: 145 mmol/L (ref 137–147)

## 2015-10-26 LAB — CBC AND DIFFERENTIAL
HCT: 38 % — AB (ref 41–53)
Hemoglobin: 12.7 g/dL — AB (ref 13.5–17.5)
Platelets: 235 10*3/uL (ref 150–399)
WBC: 6.5 10*3/mL

## 2015-11-16 ENCOUNTER — Non-Acute Institutional Stay (SKILLED_NURSING_FACILITY): Payer: Medicare Other | Admitting: Internal Medicine

## 2015-11-16 ENCOUNTER — Encounter: Payer: Self-pay | Admitting: Internal Medicine

## 2015-11-16 DIAGNOSIS — I10 Essential (primary) hypertension: Secondary | ICD-10-CM

## 2015-11-16 DIAGNOSIS — R569 Unspecified convulsions: Secondary | ICD-10-CM | POA: Diagnosis not present

## 2015-11-16 DIAGNOSIS — J449 Chronic obstructive pulmonary disease, unspecified: Secondary | ICD-10-CM | POA: Diagnosis not present

## 2015-11-16 NOTE — Progress Notes (Signed)
Location:  [place] Room Number: [phone] Place of Service:  SNF [phone])  Noah Delaine. Sheppard Coil, MD  Patient Care Team: Seward Carol, MD as PCP - General (Internal Medicine)  Extended Emergency Contact Information Primary Emergency Contact: [place] of [place] Mobile Phone: [phone] Relation: None    Allergies: Patient has no known allergies.  Chief Complaint  Patient presents with  . Medical Management of Chronic Issues    HPI: Patient is 82 y.o. male who is being seen for routine issues of HTN, COPD, and seizures.  Past Medical History:  Diagnosis Date  . Asthma   . Atrial fibrillation ([place])   . Hypertension   . lt renal ca dx'd 03/2010  . Seizures ([place])   . Stroke [facility])     Past Surgical History:  Procedure Laterality Date  . CRANIOTOMY  1981   bilateral crani for "stroke"  . TONSILLECTOMY AND ADENOIDECTOMY        Medication List       Accurate as of 11/16/15 11:59 PM. Always use your most recent med list.          albuterol 108 (90 Base) MCG/ACT inhaler Commonly known as:  PROVENTIL HFA;VENTOLIN HFA Inhale 2 puffs into the lungs every 4 (four) hours as needed for wheezing or shortness of breath.   clopidogrel 75 MG tablet Commonly known as:  PLAVIX Take 75 mg by mouth daily.   furosemide 20 MG tablet Commonly known as:  LASIX Take 20 mg by mouth daily.   levETIRAcetam 500 MG tablet Commonly known as:  KEPPRA Take 1 tablet (500 mg total) by mouth 2 (two) times daily.   lisinopril 20 MG tablet Commonly known as:  PRINIVIL,ZESTRIL Take 1 tablet (20 mg total) by mouth daily.   multivitamin tablet Take 1 tablet by mouth daily.   NIFEdipine 60 MG 24 hr tablet Commonly known as:  PROCARDIA XL/ADALAT-CC Take 1 tablet (60 mg total) by mouth daily.   PHENObarbital 60 MG tablet Commonly known as:  LUMINAL Take one and 1/2 tablet by mouth every evening   QVAR 80 MCG/ACT inhaler Generic drug:   beclomethasone Inhale 1 puff into the lungs 2 (two) times daily.   rosuvastatin 5 MG tablet Commonly known as:  CRESTOR Take 5 mg by mouth every evening.   SEREVENT DISKUS 50 MCG/DOSE diskus inhaler Generic drug:  salmeterol Inhale 1 puff into the lungs daily.       Meds ordered this encounter  Medications  . Multiple Vitamin (MULTIVITAMIN) tablet    Sig: Take 1 tablet by mouth daily.    Immunization History  Administered Date(s) Administered  . PPD Test 03/19/2015, 10/18/2015    Social History  Substance Use Topics  . Smoking status: Former Smoker    Types: Cigarettes    Quit date: 02/11/1983  . Smokeless tobacco: Former Systems developer     Comment: quit smoking about 32 years ago.   . Alcohol use No    Review of systems - UTO 2/2 aphasia; nursing without concerns    Vitals:   11/16/15 1343  BP: (!) 154/98  Pulse: 85  Resp: 18  Temp: 99 F (37.2 C)   Body mass index is 33.52 kg/m. Physical Exam  GENERAL APPEARANCE: Alert, nonconversant, No acute distress  SKIN: No diaphoresis rash HEENT: Unremarkable RESPIRATORY: Breathing is even, unlabored. Lung sounds are clear   CARDIOVASCULAR: Heart RRR no murmurs, rubs or gallops. No peripheral edema  GASTROINTESTINAL: Abdomen is soft,  non-tender, not distended w/ normal bowel sounds.  GENITOURINARY: Bladder non tender, not distended  MUSCULOSKELETAL: No abnormal joints or musculature NEUROLOGIC: Cranial nerves 2-12 grossly intact; R side weakness PSYCHIATRIC: pt didn't speak, no behavioral issues  Patient Active Problem List   Diagnosis Date Noted  . Cytotoxic brain edema ([place]) 10/20/2015  . Lower extremity cellulitis 10/20/2015  . COPD (chronic obstructive pulmonary disease) ([place]) 10/20/2015  . Abnormality of gait and mobility   . Aphasia due to recent stroke   . Right hemiparesis ([place])   . Acute CVA (cerebrovascular accident) ([place]) 10/14/2015  . Stroke ([place])   . Meningioma ([place])   . Confusion 10/13/2015  . H/O:  CVA (cerebrovascular accident) 04/08/2015  . Seizures ([place]) 03/28/2015  . Hypertensive emergency 03/28/2015  . Hyperlipidemia 03/28/2015  . Altered mental status 03/15/2015  . Paroxysmal atrial fibrillation ([place]) 03/15/2015  . Hypertension 03/15/2015  . Asthma 03/15/2015  . Acute encephalopathy 03/15/2015  . Renal mass, left     CMP     Component Value Date/Time   NA 145 10/26/2015   K 4.3 10/26/2015   CL 105 10/15/2015 0604   CO2 26 10/15/2015 0604   GLUCOSE 108 (H) 10/15/2015 0604   BUN 23 (A) 10/26/2015   CREATININE 0.8 10/26/2015   CREATININE 0.98 10/15/2015 0604   CREATININE 0.87 03/31/2014 1805   CALCIUM 8.4 (L) 10/15/2015 0604   PROT 5.8 (L) 10/13/2015 1244   ALBUMIN 3.4 (L) 10/13/2015 1244   AST 17 10/13/2015 1244   ALT 13 (L) 10/13/2015 1244   ALKPHOS 77 10/13/2015 1244   BILITOT 0.3 10/13/2015 1244   GFRNONAA >60 10/15/2015 0604   GFRNONAA 82 03/31/2014 1805   GFRAA >60 10/15/2015 0604   GFRAA >89 03/31/2014 1805    Recent Labs  03/19/15 0530  10/13/15 1244 10/13/15 1259 10/15/15 0604 10/15/15 1259 10/26/15  NA 143  < > 143 144  144 139 139 145  K 4.1  --  3.9 3.9  3.9 3.8  --  4.3  CL 106  --  108 105 105  --   --   CO2 29  --  29  --  26  --   --   GLUCOSE 101*  --  121* 113* 108*  --   --   BUN 17  < > 17 21  21* 12 12 23*  CREATININE 1.10  < > 1.04 1.00  1.0 0.98 1.0 0.8  CALCIUM 8.9  --  8.7*  --  8.4*  --   --   < > = values in this interval not displayed.  Recent Labs  03/15/15 0923 03/19/15 0530 10/13/15 1244  AST 19 20 17   ALT 15* 18 13*  ALKPHOS 81 82 77  BILITOT 0.7 0.4 0.3  PROT 6.2* 6.5 5.8*  ALBUMIN 3.7 3.5 3.4*    Recent Labs  03/15/15 0434  03/19/15 0530  10/13/15 1343 10/15/15 0604 10/15/15 1259 10/26/15  WBC 5.9  < > 5.9  < > 5.5 9.0 9.0 6.5  NEUTROABS 3.0  --   --   --  2.8  --   --   --   HGB 13.7  < > 13.3  < > 12.7* 13.6  --  12.7*  HCT 41.9  < > 40.5  < > 38.4* 40.9  --  38*  MCV 91.9  < > 92.7  --   93.0 91.7  --   --   PLT 166  < > 171  --  155 175  --  235  < > = values in this interval not displayed.  Recent Labs  10/14/15 0532  CHOL 137  LDLCALC 71  TRIG 78   No results found for: [facility] Lab Results  Component Value Date   TSH 2.490 03/15/2015   Lab Results  Component Value Date   HGBA1C 5.3 10/14/2015   Lab Results  Component Value Date   CHOL 137 10/14/2015   HDL 50 10/14/2015   LDLCALC 71 10/14/2015   TRIG 78 10/14/2015   CHOLHDL 2.7 10/14/2015    Significant Diagnostic Results in last 30 days:  No results found.  Assessment and Plan  Hypertension Not well controlled on procardia XL 60 mg, lasix 20 mg and Lisinopril 20 mg; plan to inc lisinopril to 40 mg daily  COPD (chronic obstructive pulmonary disease) (HCC) No exacerbations;cont Qvar daily and serevent daily  Seizures (HCC) No reported seizures; plan to cont keppra 500 mg BID and phenobarbital 60 mg qHS     Treasure Ochs D. Sheppard Coil, MD

## 2015-11-27 ENCOUNTER — Encounter: Payer: Self-pay | Admitting: Internal Medicine

## 2015-11-27 NOTE — Assessment & Plan Note (Signed)
No reported seizures; plan to cont keppra 500 mg BID and phenobarbital 60 mg qHS

## 2015-11-27 NOTE — Assessment & Plan Note (Signed)
No exacerbations;cont Qvar daily and serevent daily

## 2015-11-27 NOTE — Assessment & Plan Note (Signed)
Not well controlled on procardia XL 60 mg, lasix 20 mg and Lisinopril 20 mg; plan to inc lisinopril to 40 mg daily

## 2015-12-01 ENCOUNTER — Encounter: Payer: Self-pay | Admitting: Internal Medicine

## 2015-12-01 ENCOUNTER — Non-Acute Institutional Stay (SKILLED_NURSING_FACILITY): Payer: Medicare Other | Admitting: Internal Medicine

## 2015-12-01 DIAGNOSIS — R55 Syncope and collapse: Secondary | ICD-10-CM | POA: Diagnosis not present

## 2015-12-01 NOTE — Progress Notes (Signed)
Location:  [place] Room Number: [phone] Place of Service:  SNF [phone])  Noah Delaine. Sheppard Coil, MD  Patient Care Team: Seward Carol, MD as PCP - General (Internal Medicine)  Extended Emergency Contact Information Primary Emergency Contact: [place] of [place] Mobile Phone: [phone] Relation: None    Allergies: Patient has no known allergies.  Chief Complaint  Patient presents with  . Acute Visit    Acute    HPI: Patient is 82 y.o. male who I   Was asked to see urgently for dec LOC. Nursing reported  5 min earlier pt could not be wakened at all. On my entrance to room pt would not open his eyes but did respond to [place] cleaning his eyes and face and did yell with sternal rub. When I returned to the room 5 min later pt could now hold his head up off the pillow while alternately falling asleep. Five minutes later  He held his head up without opening his eyes. He speaks very little at baseline, aphasia, so this was not abnormal. VS stable. O2 sat RA 97%, BS normal. Ten  minutes later pt was sitting up in bed being fed by [place] and he ate the majority of his plate. Per [place], who knows him best, he is at his baseline.  Past Medical History:  Diagnosis Date  . Asthma   . Atrial fibrillation ([place])   . Hypertension   . lt renal ca dx'd 03/2010  . Seizures ([place])   . Stroke [facility])     Past Surgical History:  Procedure Laterality Date  . CRANIOTOMY  1981   bilateral crani for "stroke"  . TONSILLECTOMY AND ADENOIDECTOMY        Medication List       Accurate as of 12/01/15  2:04 PM. Always use your most recent med list.          albuterol 108 (90 Base) MCG/ACT inhaler Commonly known as:  PROVENTIL HFA;VENTOLIN HFA Inhale 2 puffs into the lungs every 4 (four) hours as needed for wheezing or shortness of breath.   clopidogrel 75 MG tablet Commonly known as:  PLAVIX Take 75 mg by mouth daily.   feeding supplement  (PRO-STAT SUGAR FREE 64) Liqd Take 30 mLs by mouth 2 (two) times daily.   furosemide 20 MG tablet Commonly known as:  LASIX Take 20 mg by mouth daily.   levETIRAcetam 500 MG tablet Commonly known as:  KEPPRA Take 1 tablet (500 mg total) by mouth 2 (two) times daily.   lisinopril 40 MG tablet Commonly known as:  PRINIVIL,ZESTRIL Take 40 mg by mouth daily.   multivitamin tablet Take 1 tablet by mouth daily.   NIFEdipine 60 MG 24 hr tablet Commonly known as:  PROCARDIA XL/ADALAT-CC Take 1 tablet (60 mg total) by mouth daily.   PHENObarbital 60 MG tablet Commonly known as:  LUMINAL Take one and 1/2 tablet by mouth every evening   QVAR 80 MCG/ACT inhaler Generic drug:  beclomethasone Inhale 1 puff into the lungs 2 (two) times daily.   rosuvastatin 5 MG tablet Commonly known as:  CRESTOR Take 5 mg by mouth every evening.   SEREVENT DISKUS 50 MCG/DOSE diskus inhaler Generic drug:  salmeterol Inhale 1 puff into the lungs daily.       Meds ordered this encounter  Medications  . lisinopril (PRINIVIL,ZESTRIL) 40 MG tablet    Sig: Take 40 mg by mouth daily.  . Amino Acids-Protein Hydrolys (FEEDING  SUPPLEMENT, PRO-STAT SUGAR FREE 64,) LIQD    Sig: Take 30 mLs by mouth 2 (two) times daily.    Immunization History  Administered Date(s) Administered  . PPD Test 03/19/2015, 10/18/2015    Social History  Substance Use Topics  . Smoking status: Former Smoker    Types: Cigarettes    Quit date: 02/11/1983  . Smokeless tobacco: Former Systems developer     Comment: quit smoking about 32 years ago.   . Alcohol use No    Review of Systems  UTO 2/2 MS change   Vitals:   12/01/15 1354  BP: 131/83  Pulse: 78  Resp: 20  Temp: 97.5 F (36.4 C)   Body mass index is 31.72 kg/m. Physical Exam  GENERAL APPEARANCE: non Alert, non conversant, No acute distress  SKIN: No diaphoresis rash HEENT: pupils smallish and min reactive RESPIRATORY: Breathing is even, unlabored. Lung sounds are  clear  RA O2 sat 97% CARDIOVASCULAR: Heart RRR no murmurs, rubs or gallops. trace peripheral edema  GASTROINTESTINAL: Abdomen is soft, non-tender, not distended w/ normal bowel sounds.  GENITOURINARY: Bladder non tender, not distended  MUSCULOSKELETAL: No abnormal joints or musculature NEUROLOGIC: Cranial nerves 2-12 grossly intact ; R side weakness PSYCHIATRIC: aphasia no behavioral issues  Patient Active Problem List   Diagnosis Date Noted  . Cytotoxic brain edema ([place]) 10/20/2015  . Lower extremity cellulitis 10/20/2015  . COPD (chronic obstructive pulmonary disease) ([place]) 10/20/2015  . Abnormality of gait and mobility   . Aphasia due to recent stroke   . Right hemiparesis ([place])   . Acute CVA (cerebrovascular accident) ([place]) 10/14/2015  . Stroke ([place])   . Meningioma ([place])   . Confusion 10/13/2015  . H/O: CVA (cerebrovascular accident) 04/08/2015  . Seizures ([place]) 03/28/2015  . Hypertensive emergency 03/28/2015  . Hyperlipidemia 03/28/2015  . Altered mental status 03/15/2015  . Paroxysmal atrial fibrillation ([place]) 03/15/2015  . Hypertension 03/15/2015  . Asthma 03/15/2015  . Acute encephalopathy 03/15/2015  . Renal mass, left     CMP     Component Value Date/Time   NA 145 10/26/2015   K 4.3 10/26/2015   CL 105 10/15/2015 0604   CO2 26 10/15/2015 0604   GLUCOSE 108 (H) 10/15/2015 0604   BUN 23 (A) 10/26/2015   CREATININE 0.8 10/26/2015   CREATININE 0.98 10/15/2015 0604   CREATININE 0.87 03/31/2014 1805   CALCIUM 8.4 (L) 10/15/2015 0604   PROT 5.8 (L) 10/13/2015 1244   ALBUMIN 3.4 (L) 10/13/2015 1244   AST 17 10/13/2015 1244   ALT 13 (L) 10/13/2015 1244   ALKPHOS 77 10/13/2015 1244   BILITOT 0.3 10/13/2015 1244   GFRNONAA >60 10/15/2015 0604   GFRNONAA 82 03/31/2014 1805   GFRAA >60 10/15/2015 0604   GFRAA >89 03/31/2014 1805    Recent Labs  03/19/15 0530  10/13/15 1244 10/13/15 1259 10/15/15 0604 10/15/15 1259 10/26/15  NA 143  < > 143 144  144 139  139 145  K 4.1  --  3.9 3.9  3.9 3.8  --  4.3  CL 106  --  108 105 105  --   --   CO2 29  --  29  --  26  --   --   GLUCOSE 101*  --  121* 113* 108*  --   --   BUN 17  < > 17 21  21* 12 12 23*  CREATININE 1.10  < > 1.04 1.00  1.0 0.98 1.0 0.8  CALCIUM 8.9  --  8.7*  --  8.4*  --   --   < > = values in this interval not displayed.  Recent Labs  03/15/15 0923 03/19/15 0530 10/13/15 1244  AST 19 20 17   ALT 15* 18 13*  ALKPHOS 81 82 77  BILITOT 0.7 0.4 0.3  PROT 6.2* 6.5 5.8*  ALBUMIN 3.7 3.5 3.4*    Recent Labs  03/15/15 0434  03/19/15 0530  10/13/15 1343 10/15/15 0604 10/15/15 1259 10/26/15  WBC 5.9  < > 5.9  < > 5.5 9.0 9.0 6.5  NEUTROABS 3.0  --   --   --  2.8  --   --   --   HGB 13.7  < > 13.3  < > 12.7* 13.6  --  12.7*  HCT 41.9  < > 40.5  < > 38.4* 40.9  --  38*  MCV 91.9  < > 92.7  --  93.0 91.7  --   --   PLT 166  < > 171  --  155 175  --  235  < > = values in this interval not displayed.  Recent Labs  10/14/15 0532  CHOL 137  LDLCALC 71  TRIG 78   No results found for: [facility] Lab Results  Component Value Date   TSH 2.490 03/15/2015   Lab Results  Component Value Date   HGBA1C 5.3 10/14/2015   Lab Results  Component Value Date   CHOL 137 10/14/2015   HDL 50 10/14/2015   LDLCALC 71 10/14/2015   TRIG 78 10/14/2015   CHOLHDL 2.7 10/14/2015    Significant Diagnostic Results in last 30 days:  No results found.  Assessment and Plan  TRANSIENT LOC - etiology? Seizure appears most likely since pt had an apparent post-ictal period and he lightened up gradually back to baseline but  Will look for other reasons.Have ordered CMP, CBC, keppra level, dilantin level   Time spent > 35 min Flossie Wexler D. Sheppard Coil, MD

## 2015-12-04 ENCOUNTER — Inpatient Hospital Stay (HOSPITAL_COMMUNITY)
Admission: EM | Admit: 2015-12-04 | Discharge: 2016-01-04 | DRG: 175 | Disposition: E | Payer: Medicare Other | Attending: Internal Medicine | Admitting: Internal Medicine

## 2015-12-04 ENCOUNTER — Emergency Department (HOSPITAL_COMMUNITY): Payer: Medicare Other

## 2015-12-04 ENCOUNTER — Encounter (HOSPITAL_COMMUNITY): Payer: Self-pay | Admitting: Radiology

## 2015-12-04 DIAGNOSIS — Z79899 Other long term (current) drug therapy: Secondary | ICD-10-CM | POA: Diagnosis not present

## 2015-12-04 DIAGNOSIS — R748 Abnormal levels of other serum enzymes: Secondary | ICD-10-CM

## 2015-12-04 DIAGNOSIS — Z7902 Long term (current) use of antithrombotics/antiplatelets: Secondary | ICD-10-CM | POA: Diagnosis not present

## 2015-12-04 DIAGNOSIS — R7989 Other specified abnormal findings of blood chemistry: Secondary | ICD-10-CM

## 2015-12-04 DIAGNOSIS — I712 Thoracic aortic aneurysm, without rupture: Secondary | ICD-10-CM | POA: Diagnosis present

## 2015-12-04 DIAGNOSIS — L8990 Pressure ulcer of unspecified site, unspecified stage: Secondary | ICD-10-CM | POA: Insufficient documentation

## 2015-12-04 DIAGNOSIS — G9341 Metabolic encephalopathy: Secondary | ICD-10-CM | POA: Diagnosis present

## 2015-12-04 DIAGNOSIS — I2699 Other pulmonary embolism without acute cor pulmonale: Principal | ICD-10-CM | POA: Diagnosis present

## 2015-12-04 DIAGNOSIS — J449 Chronic obstructive pulmonary disease, unspecified: Secondary | ICD-10-CM | POA: Diagnosis present

## 2015-12-04 DIAGNOSIS — E870 Hyperosmolality and hypernatremia: Secondary | ICD-10-CM | POA: Diagnosis present

## 2015-12-04 DIAGNOSIS — E871 Hypo-osmolality and hyponatremia: Secondary | ICD-10-CM

## 2015-12-04 DIAGNOSIS — R4182 Altered mental status, unspecified: Secondary | ICD-10-CM | POA: Diagnosis not present

## 2015-12-04 DIAGNOSIS — Z823 Family history of stroke: Secondary | ICD-10-CM

## 2015-12-04 DIAGNOSIS — I69351 Hemiplegia and hemiparesis following cerebral infarction affecting right dominant side: Secondary | ICD-10-CM | POA: Diagnosis not present

## 2015-12-04 DIAGNOSIS — Z8673 Personal history of transient ischemic attack (TIA), and cerebral infarction without residual deficits: Secondary | ICD-10-CM

## 2015-12-04 DIAGNOSIS — Z87891 Personal history of nicotine dependence: Secondary | ICD-10-CM | POA: Diagnosis not present

## 2015-12-04 DIAGNOSIS — Z66 Do not resuscitate: Secondary | ICD-10-CM | POA: Diagnosis present

## 2015-12-04 DIAGNOSIS — Z85528 Personal history of other malignant neoplasm of kidney: Secondary | ICD-10-CM | POA: Diagnosis not present

## 2015-12-04 DIAGNOSIS — J9601 Acute respiratory failure with hypoxia: Secondary | ICD-10-CM | POA: Diagnosis present

## 2015-12-04 DIAGNOSIS — R778 Other specified abnormalities of plasma proteins: Secondary | ICD-10-CM | POA: Diagnosis present

## 2015-12-04 DIAGNOSIS — I480 Paroxysmal atrial fibrillation: Secondary | ICD-10-CM

## 2015-12-04 DIAGNOSIS — L989 Disorder of the skin and subcutaneous tissue, unspecified: Secondary | ICD-10-CM | POA: Diagnosis present

## 2015-12-04 DIAGNOSIS — R4701 Aphasia: Secondary | ICD-10-CM | POA: Diagnosis present

## 2015-12-04 DIAGNOSIS — I248 Other forms of acute ischemic heart disease: Secondary | ICD-10-CM | POA: Diagnosis present

## 2015-12-04 DIAGNOSIS — Z515 Encounter for palliative care: Secondary | ICD-10-CM | POA: Diagnosis present

## 2015-12-04 DIAGNOSIS — I69320 Aphasia following cerebral infarction: Secondary | ICD-10-CM | POA: Diagnosis not present

## 2015-12-04 DIAGNOSIS — G9340 Encephalopathy, unspecified: Secondary | ICD-10-CM | POA: Diagnosis not present

## 2015-12-04 DIAGNOSIS — G8191 Hemiplegia, unspecified affecting right dominant side: Secondary | ICD-10-CM

## 2015-12-04 DIAGNOSIS — E785 Hyperlipidemia, unspecified: Secondary | ICD-10-CM | POA: Diagnosis present

## 2015-12-04 DIAGNOSIS — I10 Essential (primary) hypertension: Secondary | ICD-10-CM | POA: Diagnosis present

## 2015-12-04 DIAGNOSIS — R569 Unspecified convulsions: Secondary | ICD-10-CM | POA: Diagnosis present

## 2015-12-04 LAB — CBC WITH DIFFERENTIAL/PLATELET
BASOS ABS: 0 10*3/uL (ref 0.0–0.1)
BASOS PCT: 0 %
EOS ABS: 0.2 10*3/uL (ref 0.0–0.7)
Eosinophils Relative: 2 %
HEMATOCRIT: 44.9 % (ref 39.0–52.0)
HEMOGLOBIN: 13.7 g/dL (ref 13.0–17.0)
Lymphocytes Relative: 13 %
Lymphs Abs: 1.4 10*3/uL (ref 0.7–4.0)
MCH: 29.8 pg (ref 26.0–34.0)
MCHC: 30.5 g/dL (ref 30.0–36.0)
MCV: 97.6 fL (ref 78.0–100.0)
MONOS PCT: 10 %
Monocytes Absolute: 1 10*3/uL (ref 0.1–1.0)
NEUTROS ABS: 8 10*3/uL — AB (ref 1.7–7.7)
NEUTROS PCT: 75 %
Platelets: 292 10*3/uL (ref 150–400)
RBC: 4.6 MIL/uL (ref 4.22–5.81)
RDW: 15.2 % (ref 11.5–15.5)
WBC: 10.6 10*3/uL — AB (ref 4.0–10.5)

## 2015-12-04 LAB — CBG MONITORING, ED: Glucose-Capillary: 150 mg/dL — ABNORMAL HIGH (ref 65–99)

## 2015-12-04 LAB — I-STAT CHEM 8, ED
BUN: 31 mg/dL — AB (ref 6–20)
CALCIUM ION: 1.05 mmol/L — AB (ref 1.15–1.40)
CREATININE: 1.1 mg/dL (ref 0.61–1.24)
Chloride: 109 mmol/L (ref 101–111)
Glucose, Bld: 157 mg/dL — ABNORMAL HIGH (ref 65–99)
HCT: 41 % (ref 39.0–52.0)
HEMOGLOBIN: 13.9 g/dL (ref 13.0–17.0)
Potassium: 4.5 mmol/L (ref 3.5–5.1)
SODIUM: 149 mmol/L — AB (ref 135–145)
TCO2: 33 mmol/L (ref 0–100)

## 2015-12-04 LAB — COMPREHENSIVE METABOLIC PANEL
ALK PHOS: 103 U/L (ref 38–126)
ALT: 37 U/L (ref 17–63)
ANION GAP: 10 (ref 5–15)
AST: 33 U/L (ref 15–41)
Albumin: 2.6 g/dL — ABNORMAL LOW (ref 3.5–5.0)
BILIRUBIN TOTAL: 0.2 mg/dL — AB (ref 0.3–1.2)
BUN: 28 mg/dL — ABNORMAL HIGH (ref 6–20)
CALCIUM: 8.3 mg/dL — AB (ref 8.9–10.3)
CO2: 30 mmol/L (ref 22–32)
CREATININE: 1.02 mg/dL (ref 0.61–1.24)
Chloride: 110 mmol/L (ref 101–111)
Glucose, Bld: 160 mg/dL — ABNORMAL HIGH (ref 65–99)
Potassium: 4.6 mmol/L (ref 3.5–5.1)
SODIUM: 150 mmol/L — AB (ref 135–145)
TOTAL PROTEIN: 6.2 g/dL — AB (ref 6.5–8.1)

## 2015-12-04 LAB — OSMOLALITY: Osmolality: 326 mOsm/kg (ref 275–295)

## 2015-12-04 LAB — TROPONIN I: Troponin I: 0.14 ng/mL (ref <0.03)

## 2015-12-04 LAB — I-STAT TROPONIN, ED: TROPONIN I, POC: 0.09 ng/mL — AB (ref 0.00–0.08)

## 2015-12-04 LAB — I-STAT CG4 LACTIC ACID, ED: Lactic Acid, Venous: 1.62 mmol/L (ref 0.5–1.9)

## 2015-12-04 LAB — GLUCOSE, CAPILLARY: Glucose-Capillary: 137 mg/dL — ABNORMAL HIGH (ref 65–99)

## 2015-12-04 MED ORDER — ALBUTEROL SULFATE (2.5 MG/3ML) 0.083% IN NEBU: 2.5000 mg | INHALATION_SOLUTION | RESPIRATORY_TRACT | Status: DC | PRN

## 2015-12-04 MED ORDER — ACETAMINOPHEN 650 MG RE SUPP
650.0000 mg | Freq: Four times a day (QID) | RECTAL | Status: DC | PRN
Start: 2015-12-04 — End: 2015-12-05

## 2015-12-04 MED ORDER — SODIUM CHLORIDE 0.9 % IV SOLN
500.0000 mg | Freq: Two times a day (BID) | INTRAVENOUS | Status: DC
  Administered 2015-12-05 (×2): 500 mg via INTRAVENOUS
  Filled 2015-12-04 (×4): qty 5

## 2015-12-04 MED ORDER — MORPHINE SULFATE (PF) 4 MG/ML IV SOLN: 2.0000 mg | INTRAVENOUS | Status: DC | PRN

## 2015-12-04 MED ORDER — DEXTROSE 5 % IV SOLN
INTRAVENOUS | Status: DC
  Administered 2015-12-04: 22:00:00 via INTRAVENOUS

## 2015-12-04 MED ORDER — ACETAMINOPHEN 325 MG PO TABS: 650.0000 mg | ORAL_TABLET | Freq: Four times a day (QID) | ORAL | Status: DC | PRN

## 2015-12-04 MED ORDER — HEPARIN (PORCINE) IN NACL 100-0.45 UNIT/ML-% IJ SOLN
1300.0000 [IU]/h | INTRAMUSCULAR | Status: DC
  Administered 2015-12-04 – 2015-12-05 (×3): 1500 [IU]/h via INTRAVENOUS
  Filled 2015-12-04 (×4): qty 250

## 2015-12-04 MED ORDER — ASPIRIN 300 MG RE SUPP
300.0000 mg | Freq: Every day | RECTAL | Status: DC
  Administered 2015-12-05: 300 mg via RECTAL
  Filled 2015-12-04 (×2): qty 1

## 2015-12-04 MED ORDER — SODIUM CHLORIDE 0.9% FLUSH
3.0000 mL | Freq: Two times a day (BID) | INTRAVENOUS | Status: DC
  Administered 2015-12-04 – 2015-12-06 (×5): 3 mL via INTRAVENOUS

## 2015-12-04 MED ORDER — HEPARIN BOLUS VIA INFUSION
5000.0000 [IU] | Freq: Once | INTRAVENOUS | Status: AC
  Administered 2015-12-04: 5000 [IU] via INTRAVENOUS
  Filled 2015-12-04: qty 5000

## 2015-12-04 MED ORDER — SODIUM CHLORIDE 0.9 % IV BOLUS (SEPSIS)
500.0000 mL | Freq: Once | INTRAVENOUS | Status: AC
  Administered 2015-12-04: 500 mL via INTRAVENOUS

## 2015-12-04 MED ORDER — HYDRALAZINE HCL 20 MG/ML IJ SOLN: 5.0000 mg | INTRAMUSCULAR | Status: DC | PRN

## 2015-12-04 MED ORDER — IOPAMIDOL (ISOVUE-370) INJECTION 76%
INTRAVENOUS | Status: AC
  Administered 2015-12-04: 100 mL
  Filled 2015-12-04: qty 100

## 2015-12-04 MED ORDER — ONDANSETRON HCL 4 MG/2ML IJ SOLN: 4.0000 mg | Freq: Four times a day (QID) | INTRAMUSCULAR | Status: DC | PRN

## 2015-12-04 MED ORDER — LEVETIRACETAM 500 MG PO TABS: 500.0000 mg | ORAL_TABLET | Freq: Two times a day (BID) | ORAL | Status: DC

## 2015-12-04 MED ORDER — IPRATROPIUM BROMIDE 0.02 % IN SOLN
0.5000 mg | Freq: Four times a day (QID) | RESPIRATORY_TRACT | Status: DC
  Administered 2015-12-05 (×2): 0.5 mg via RESPIRATORY_TRACT
  Filled 2015-12-04 (×2): qty 2.5

## 2015-12-04 MED ORDER — LORAZEPAM 2 MG/ML IJ SOLN: 1.0000 mg | INTRAMUSCULAR | Status: DC | PRN

## 2015-12-04 MED ORDER — ONDANSETRON HCL 4 MG PO TABS: 4.0000 mg | ORAL_TABLET | Freq: Four times a day (QID) | ORAL | Status: DC | PRN

## 2015-12-04 NOTE — ED Provider Notes (Signed)
[facility] DEPT Provider Note   CSN: [id number] Arrival date & time: 12/12/2015  1714     History   Chief Complaint Chief Complaint  Patient presents with  . Altered Mental Status    HPI Arthur Wood is a 81 y.o. male.  The history is provided by the EMS personnel and a relative. No language interpreter was used.  Altered Mental Status      Arthur Wood is a 82 y.o. male who presents to the Emergency Department complaining of AMS.  Level V caveat due to confusion.  He was brought in by EMS from nursing facility following change in his mental status. He is less responsive than baseline. He was last seen normal at midnight. At baseline he has right hemiparesis and is nonverbal due to prior strokes. Last stroke was 1-1/2 months ago.   Past Medical History:  Diagnosis Date  . Asthma   . Atrial fibrillation ([place])   . Hypertension   . lt renal ca dx'd 03/2010  . Seizures ([place])   . Stroke [facility])     Patient Active Problem List   Diagnosis Date Noted  . Cytotoxic brain edema ([place]) 10/20/2015  . Lower extremity cellulitis 10/20/2015  . COPD (chronic obstructive pulmonary disease) ([place]) 10/20/2015  . Abnormality of gait and mobility   . Aphasia due to recent stroke   . Right hemiparesis ([place])   . Acute CVA (cerebrovascular accident) ([place]) 10/14/2015  . Stroke ([place])   . Meningioma ([place])   . Confusion 10/13/2015  . H/O: CVA (cerebrovascular accident) 04/08/2015  . Seizures ([place]) 03/28/2015  . Hypertensive emergency 03/28/2015  . Hyperlipidemia 03/28/2015  . Altered mental status 03/15/2015  . Paroxysmal atrial fibrillation ([place]) 03/15/2015  . Hypertension 03/15/2015  . Asthma 03/15/2015  . Acute encephalopathy 03/15/2015  . Renal mass, left     Past Surgical History:  Procedure Laterality Date  . CRANIOTOMY  1981   bilateral crani for "stroke"  . TONSILLECTOMY AND ADENOIDECTOMY         Home Medications    Prior to Admission medications   Medication Sig  Start Date End Date Taking? Authorizing Provider  albuterol (PROVENTIL HFA;VENTOLIN HFA) 108 (90 Base) MCG/ACT inhaler Inhale 2 puffs into the lungs every 4 (four) hours as needed for wheezing or shortness of breath.     Historical Provider, MD  Amino Acids-Protein Hydrolys (FEEDING SUPPLEMENT, PRO-STAT SUGAR FREE 64,) LIQD Take 30 mLs by mouth 2 (two) times daily.    Historical Provider, MD  beclomethasone (QVAR) 80 MCG/ACT inhaler Inhale 1 puff into the lungs 2 (two) times daily.    Historical Provider, MD  clopidogrel (PLAVIX) 75 MG tablet Take 75 mg by mouth daily.      Historical Provider, MD  furosemide (LASIX) 20 MG tablet Take 20 mg by mouth daily.      Historical Provider, MD  levETIRAcetam (KEPPRA) 500 MG tablet Take 1 tablet (500 mg total) by mouth 2 (two) times daily. 03/19/15   Ripudeep Krystal Eaton, MD  lisinopril (PRINIVIL,ZESTRIL) 40 MG tablet Take 40 mg by mouth daily.    Historical Provider, MD  Multiple Vitamin (MULTIVITAMIN) tablet Take 1 tablet by mouth daily.    Historical Provider, MD  NIFEdipine (PROCARDIA XL/ADALAT-CC) 60 MG 24 hr tablet Take 1 tablet (60 mg total) by mouth daily. 03/19/15   Ripudeep Krystal Eaton, MD  PHENObarbital (LUMINAL) 60 MG tablet Take one and 1/2 tablet by mouth every evening 03/31/15   Estill Dooms, MD  rosuvastatin (  CRESTOR) 5 MG tablet Take 5 mg by mouth every evening.     Historical Provider, MD  salmeterol (SEREVENT DISKUS) 50 MCG/DOSE diskus inhaler Inhale 1 puff into the lungs daily.    Historical Provider, MD    Family History Family History  Problem Relation Age of Onset  . Stroke Mother   . Depression Father   . Suicidality Father     Social History Social History  Substance Use Topics  . Smoking status: Former Smoker    Types: Cigarettes    Quit date: 02/11/1983  . Smokeless tobacco: Former Systems developer     Comment: quit smoking about 32 years ago.   . Alcohol use No     Allergies   Patient has no known allergies.   Review of Systems Review  of Systems  Unable to perform ROS: Mental status change     Physical Exam Updated Vital Signs BP 122/76 (BP Location: Left Arm)   Resp (!) 38   SpO2 100%   Physical Exam  Constitutional: He appears well-developed and well-nourished.  HENT:  Head: Normocephalic and atraumatic.  Cardiovascular: Normal rate and regular rhythm.   No murmur heard. Pulmonary/Chest:  Tachypnea with decreased air movement in bilateral bases  Abdominal: Soft. There is no tenderness. There is no rebound and no guarding.  Musculoskeletal: He exhibits no tenderness.  Nonpitting edema to bilateral lower extremities  Neurological:  Flaccid paralysis of the right upper extremity. Moans painful stimuli and withdrawals to painful stimuli in the left upper extremity.  Skin: Skin is warm and dry.  Psychiatric:  Unable to assess  Nursing note and vitals reviewed.    ED Treatments / Results  Labs (all labs ordered are listed, but only abnormal results are displayed) Labs Reviewed  COMPREHENSIVE METABOLIC PANEL - Abnormal; Notable for the following:       Result Value   Sodium 150 (*)    Glucose, Bld 160 (*)    BUN 28 (*)    Calcium 8.3 (*)    Total Protein 6.2 (*)    Albumin 2.6 (*)    Total Bilirubin 0.2 (*)    All other components within normal limits  CBC WITH DIFFERENTIAL/PLATELET - Abnormal; Notable for the following:    WBC 10.6 (*)    Neutro Abs 8.0 (*)    All other components within normal limits  URINALYSIS, ROUTINE W REFLEX MICROSCOPIC (NOT AT [facility]) - Abnormal; Notable for the following:    Specific Gravity, Urine 1.031 (*)    All other components within normal limits  HEPARIN LEVEL (UNFRACTIONATED) - Abnormal; Notable for the following:    Heparin Unfractionated 0.83 (*)    All other components within normal limits  OSMOLALITY - Abnormal; Notable for the following:    Osmolality 326 (*)    All other components within normal limits  BRAIN NATRIURETIC PEPTIDE - Abnormal; Notable for the  following:    B Natriuretic Peptide 282.9 (*)    All other components within normal limits  TROPONIN I - Abnormal; Notable for the following:    Troponin I 0.14 (*)    All other components within normal limits  TROPONIN I - Abnormal; Notable for the following:    Troponin I 0.12 (*)    All other components within normal limits  TROPONIN I - Abnormal; Notable for the following:    Troponin I 0.08 (*)    All other components within normal limits  LIPID PANEL - Abnormal; Notable for the following:  HDL 38 (*)    All other components within normal limits  BASIC METABOLIC PANEL - Abnormal; Notable for the following:    Sodium 149 (*)    Glucose, Bld 149 (*)    BUN 23 (*)    Calcium 8.3 (*)    All other components within normal limits  CBC - Abnormal; Notable for the following:    Hemoglobin 12.7 (*)    All other components within normal limits  GLUCOSE, CAPILLARY - Abnormal; Notable for the following:    Glucose-Capillary 137 (*)    All other components within normal limits  HEPARIN LEVEL (UNFRACTIONATED) - Abnormal; Notable for the following:    Heparin Unfractionated 0.89 (*)    All other components within normal limits  GLUCOSE, CAPILLARY - Abnormal; Notable for the following:    Glucose-Capillary 113 (*)    All other components within normal limits  GLUCOSE, CAPILLARY - Abnormal; Notable for the following:    Glucose-Capillary 111 (*)    All other components within normal limits  I-STAT TROPOININ, ED - Abnormal; Notable for the following:    Troponin i, poc 0.09 (*)    All other components within normal limits  I-STAT CHEM 8, ED - Abnormal; Notable for the following:    Sodium 149 (*)    BUN 31 (*)    Glucose, Bld 157 (*)    Calcium, Ion 1.05 (*)    All other components within normal limits  CBG MONITORING, ED - Abnormal; Notable for the following:    Glucose-Capillary 150 (*)    All other components within normal limits  MRSA PCR SCREENING  CULTURE, BLOOD (ROUTINE X  2)  CULTURE, BLOOD (ROUTINE X 2)  URINE CULTURE  OSMOLALITY, URINE  SODIUM, URINE, RANDOM  LEVETIRACETAM LEVEL  HEMOGLOBIN A1C  I-STAT CG4 LACTIC ACID, ED    EKG  EKG Interpretation None       Radiology Ct Head Wo Contrast  Result Date: 01/02/2016 CLINICAL DATA:  Altered mental status EXAM: CT HEAD WITHOUT CONTRAST TECHNIQUE: Contiguous axial images were obtained from the base of the skull through the vertex without intravenous contrast. COMPARISON:  MRI 10/14/2015, CT 10/13/2015, 03/15/2015 FINDINGS: Brain: No acute intracranial hemorrhage is visualized. Large area of cystic encephalomalacia change within the right temporal lobe. Old infarcts in the left posterior frontal and temporal lobes. Persistent edema within the inferior left frontal lobe and the left temporal lobe. Mildly dense mass along the left sphenoid wing measures approximately 1.5 cm thick by 3.7 cm AP allowing for limited assessment without contrast. Left mildly dense posterior fossa mass measures approximately 1 cm and is also unchanged. Chronic extra-axial CSF collections at the cranial vertex, left interhemispheric region, and right greater than left anterior convexities consistent with chronic subdural hygromas. Ventricles are similar in size and morphology. Moderate periventricular and deep white matter small vessel disease, grossly unchanged. Vascular: No hyperdense vessels.  Carotid artery calcifications. Skull: Mastoid air cells are clear. Bilateral anterior craniotomy changes. Sinuses/Orbits: Minimal mucosal thickening in the right maxillary sinus. No acute orbital abnormality. Other: None IMPRESSION: 1. No acute intracranial hemorrhage. 2. 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 3. Multiple old bilateral appearing infarcts with encephalomalacia changes. 4. Bilateral extra-axial CSF collections consistent with chronic  subdural hygroma. 5. Moderate periventricular and deep white matter small vessel changes. Electronically Signed   By: Donavan Foil M.D.   On: 12/15/2015 19:10  Ct Angio Chest Pe W/cm &/or Wo Cm  Result Date: 12/05/2015 CLINICAL DATA:  Dyspnea, altered mental status EXAM: CT ANGIOGRAPHY CHEST WITH CONTRAST TECHNIQUE: Multidetector CT imaging of the chest was performed using the standard protocol during bolus administration of intravenous contrast. Multiplanar CT image reconstructions and MIPs were obtained to evaluate the vascular anatomy. CONTRAST:  80 cc Isovue 370 IV COMPARISON:  Chest radiographs dating back to 03/15/2015. 03/15/2015 CT abdomen which includes lung bases. FINDINGS: Cardiovascular: Satisfactory opacification of the pulmonary arteries to the segmental level. There are bilateral lobar, segmental and subsegmental pulmonary emboli to the upper lobes and right lower lobe as well as subsegmental pulmonary emboli to the left lower lobe. RV to LV ratio 1.1. Heart is top normal with coronary arteriosclerosis. There is aortic atherosclerosis with 4.7 cm ascending aortic aneurysm. Mediastinum/Nodes: Esophagus is unremarkable. No lymphadenopathy. Trachea and mainstem bronchi are patent. Lungs/Pleura: There is atelectasis in both lower lobes, right middle lobe and lingula. Small right as well as trace left pleural effusions. Upper Abdomen: Normal appearing adrenal glands. Stable left renal mass ablation site with exophytic simple appearing cyst noted off the interpolar left kidney. Nonobstructing calcification in the mid right kidney. The visualized upper abdominal aorta is densely calcified as are its branch vessels. Musculoskeletal: No acute abnormality Review of the MIP images confirms the above findings. IMPRESSION: Positive for acute PE with CT evidence of right heart strain (RV/LV Ratio = 1.1) consistent with at least submassive (intermediate risk) PE. The presence of right heart strain has been  associated with an increased risk of morbidity and mortality. Please activate Code PE by paging [phone]. Critical Value/emergent results were called by telephone at the time of interpretation on 12/05/2015 at 8:59 pm to Dr. Quintella Reichert , who verbally acknowledged these results. Right greater than left small pleural effusions with atelectasis. 4.7 cm ascending aortic aneurysm. New Recommend semi-annual imaging followup by CTA or MRA and referral to cardiothoracic surgery if not already obtained. This recommendation follows 2010 ACCF/AHA/AATS/ACR/ASA/SCA/SCAI/SIR/STS/SVM Guidelines for the Diagnosis and Management of Patients With Thoracic Aortic Disease. Circulation. 2010; [id number] Electronically Signed   By: Ashley Royalty M.D.   On: 12/29/2015 21:00   Dg Chest Port 1 View  Result Date: 12/21/2015 CLINICAL DATA:  Hypoxia. Altered mental status. Atrial fibrillation. EXAM: PORTABLE CHEST 1 VIEW COMPARISON:  10/13/2015 FINDINGS: Mild cardiomegaly stable. Aortic atherosclerosis. Mild elevation of left hemidiaphragm is unchanged. No evidence of pulmonary infiltrate or pleural effusion. IMPRESSION: No active lung disease. Stable cardiomegaly and aortic atherosclerosis. Electronically Signed   By: Earle Gell M.D.   On: 12/05/2015 18:36    Procedures Procedures (including critical care time) CRITICAL CARE Performed by: Quintella Reichert   Total critical care time: 35 minutes  Critical care time was exclusive of separately billable procedures and treating other patients.  Critical care was necessary to treat or prevent imminent or life-threatening deterioration.  Critical care was time spent personally by me on the following activities: development of treatment plan with patient and/or surrogate as well as nursing, discussions with consultants, evaluation of patient's response to treatment, examination of patient, obtaining history from patient or surrogate, ordering and performing treatments and  interventions, ordering and review of laboratory studies, ordering and review of radiographic studies, pulse oximetry and re-evaluation of patient's condition.  Medications Ordered in ED Medications - No data to display   Initial Impression / Assessment and Plan / ED Course  I have reviewed the triage vital signs and the nursing notes.  Pertinent labs & imaging results that were available during my care of the patient were reviewed by me and considered in my medical decision making (see chart for details).  Clinical Course    Patient with history of prior CVA here with change in mental status. He is minimally responsive in the emergency department with respiratory distress. CT brain with no evidence of acute stroke. PE study obtained given new onset hypoxia and respiratory distress. CT demonstrates large pulmonary embolism with evidence of right heart strain. He was initiated on heparin.  Discussed with the patient's son the critical nature of his illness and current poor quality of life. Son wishes the patient Is DO NOT RESUSCITATE/ DO NOT INTUBATE and the patient would not want resuscitation if he were to decompensate. Palliative care consult placed.  Hospitalist consulted for admission. The patient's son would appreciate updates by phone (he lives in [place]).  Please contact son, Seleem Sikich, [phone] for patient care updates.     Final Clinical Impressions(s) / ED Diagnoses   Final diagnoses:  PE (pulmonary thromboembolism) ([place])    New Prescriptions New Prescriptions   No medications on file     Quintella Reichert, MD 12/05/15 1459

## 2015-12-04 NOTE — ED Notes (Signed)
Pt sats 85% on 5L [place]; Pt put on 15 L NR. RT at bedside; MD made aware.

## 2015-12-04 NOTE — ED Triage Notes (Signed)
Pt arrived via EMS from [facility]. Nurse found pt altered/difficult to arouse. Sats 87% RA; pt placed on NR. Pt placed on 4 L [place] with EMS, sats remained 100%. LSN 0030 this AM. Pt aphasic with EMS. Pt not responding to questions; vocalize pain/discomfort.

## 2015-12-04 NOTE — ED Notes (Signed)
Patient transported to CT 

## 2015-12-04 NOTE — ED Notes (Signed)
In/out cath attempted however unable to complete.  EDP notified.

## 2015-12-04 NOTE — ED Notes (Addendum)
Spoke to provider regarding giving heparin, instructed to hold until she spoke to the family.

## 2015-12-04 NOTE — ED Notes (Signed)
Removed pt from NRB and placed on [place] at 4 liters.

## 2015-12-04 NOTE — ED Notes (Signed)
Patient returned from CT

## 2015-12-04 NOTE — Progress Notes (Signed)
ANTICOAGULATION CONSULT NOTE - Initial Consult  Pharmacy Consult for heparin Indication: pulmonary embolus  No Known Allergies  Patient Measurements: Weight: 217 lb 9.5 oz (98.7 kg)  Vital Signs: Temp: 100.6 F (38.1 C) (12/01 1756) Temp Source: Rectal (12/01 1756) BP: 122/78 (12/01 2045) Pulse Rate: 96 (12/01 2045)  Labs:  Recent Labs  12/08/2015 1745 01/03/2016 1800  HGB 13.7 13.9  HCT 44.9 41.0  PLT 292  --   CREATININE 1.02 1.10   Estimated Creatinine Clearance: 60 mL/min (by C-G formula based on SCr of 1.1 mg/dL).  Medical History: Past Medical History:  Diagnosis Date  . Asthma   . Atrial fibrillation ([place])   . Hypertension   . lt renal ca dx'd 03/2010  . Seizures ([place])   . Stroke [facility])    Assessment: Lyndall Counterman is a 82 y.o. male who presents to the Emergency Department with submassive pulmonary embolism. Right heart strain indicated on imaging. Patient not on anticoagulation PTA. CBC stable WNL  Goal of Therapy:  Heparin level 0.3-0.7 units/ml Monitor platelets by anticoagulation protocol: Yes   Plan:  Give 5000 units bolus x 1 Start heparin infusion at 1500 units/hr Check anti-Xa level in 8 hours and daily while on heparin Continue to monitor H&H and platelets  Raiden Yearwood L Diontay Rosencrans 01/02/2016,8:57 PM

## 2015-12-04 NOTE — H&P (Signed)
History and Physical    Arthur Wood [id number] DOB: December 25, 1931 DOA: 12/20/2015  Referring MD/NP/PA:   PCP: Kandice Hams, MD   Patient coming from:  The patient is coming from SNF home.  At baseline, pt is dependent for most of ADL.   Chief Complaint: AMS and oxygen desaturation  HPI: Arthur Wood is a 82 y.o. male with medical history significant of stroke (nonverbal, right hemiparesis), seizure, left renal cancer, atrial fibrillation not on anticoagulants, hypertension, hyperlipidemia, COPD, asthma, who presents with altered mental status and oxygen desaturation.  Patient is from nursing home, brought to ED due to altered mental status. Pt is not arousable. He was found to have oxygen desaturated to 87% with a respiratory distress. Patient is nonverbal and has a right-sided hemiparesis from previous stroke. Patient is not actively coughing, no diarrhea noted. Not sure if patient has any pain anywhere. He has a mild fever with temperature 100.6 on admission.  ED Course: pt was found to have WBC 10.6, lactate 1.62, troponin 0.09, negative urinalysis, negative chest x-ray, sodium 150, creatinine normal, tachycardia, tachypnea, and saturation 86% on room air, which improved to 96 on 5 L oxygen by nasal cannula. CT-head is negative for acute intracranial abnormalities. CT angiogram of the chest showed submassive PE with right heart screening, and also showed 4.7 cm of ascending aortic aneurysm. Pt is admitted to stepdown as inpatient. IV heparin was started.   Review of Systems: Could not be reviewed due to altered mental status and nonverbal status  Allergy: No Known Allergies  Past Medical History:  Diagnosis Date  . Asthma   . Atrial fibrillation ([place])   . Hypertension   . lt renal ca dx'd 03/2010  . Seizures ([place])   . Stroke [facility])     Past Surgical History:  Procedure Laterality Date  . CRANIOTOMY  1981   bilateral crani for "stroke"  . TONSILLECTOMY AND ADENOIDECTOMY       Social History:  reports that he quit smoking about 32 years ago. His smoking use included Cigarettes. He has quit using smokeless tobacco. He reports that he does not drink alcohol or use drugs.  Family History:  Family History  Problem Relation Age of Onset  . Stroke Mother   . Depression Father   . Suicidality Father      Prior to Admission medications   Medication Sig Start Date End Date Taking? Authorizing Provider  albuterol (PROVENTIL HFA;VENTOLIN HFA) 108 (90 Base) MCG/ACT inhaler Inhale 2 puffs into the lungs every 4 (four) hours as needed for wheezing or shortness of breath.     Historical Provider, MD  Amino Acids-Protein Hydrolys (FEEDING SUPPLEMENT, PRO-STAT SUGAR FREE 64,) LIQD Take 30 mLs by mouth 2 (two) times daily.    Historical Provider, MD  beclomethasone (QVAR) 80 MCG/ACT inhaler Inhale 1 puff into the lungs 2 (two) times daily.    Historical Provider, MD  clopidogrel (PLAVIX) 75 MG tablet Take 75 mg by mouth daily.      Historical Provider, MD  furosemide (LASIX) 20 MG tablet Take 20 mg by mouth daily.      Historical Provider, MD  levETIRAcetam (KEPPRA) 500 MG tablet Take 1 tablet (500 mg total) by mouth 2 (two) times daily. 03/19/15   Ripudeep Krystal Eaton, MD  lisinopril (PRINIVIL,ZESTRIL) 40 MG tablet Take 40 mg by mouth daily.    Historical Provider, MD  Multiple Vitamin (MULTIVITAMIN) tablet Take 1 tablet by mouth daily.    Historical Provider, MD  NIFEdipine (PROCARDIA  XL/ADALAT-CC) 60 MG 24 hr tablet Take 1 tablet (60 mg total) by mouth daily. 03/19/15   Ripudeep Krystal Eaton, MD  PHENObarbital (LUMINAL) 60 MG tablet Take one and 1/2 tablet by mouth every evening 03/31/15   Estill Dooms, MD  rosuvastatin (CRESTOR) 5 MG tablet Take 5 mg by mouth every evening.     Historical Provider, MD  salmeterol (SEREVENT DISKUS) 50 MCG/DOSE diskus inhaler Inhale 1 puff into the lungs daily.    Historical Provider, MD    Physical Exam: Vitals:   12/05/15 0349 12/05/15 0400  12/05/15 0500 12/05/15 0600  BP:  119/77 131/85 117/73  Pulse:  86 82 81  Resp:  (!) 26 (!) 25 (!) 35  Temp: 98.4 F (36.9 C)     TempSrc: Axillary     SpO2:  100% 98% 97%  Weight:      Height:       General: Not in acute distress HEENT:       Eyes: PERRL, EOMI, no scleral icterus.       ENT: No discharge from the ears and nose, no pharynx injection, no tonsillar enlargement.        Neck: No JVD, no bruit, no mass felt. Heme: No neck lymph node enlargement. Cardiac: S1/S2, RRR, No murmurs, No gallops or rubs. Respiratory: No rales, wheezing, rhonchi or rubs. GI: Soft, nondistended, nontender, no rebound pain, no organomegaly, BS present. GU: No hematuria Ext: trace leg edema bilaterally. 2+DP/PT pulse bilaterally. Musculoskeletal: No joint deformities, No joint redness or warmth, no limitation of ROM in spin. Skin: No rashes.  Neuro: not arousable, not oriented X3, right-sided hemiparesis    Labs on Admission: I have personally reviewed following labs and imaging studies  CBC:  Recent Labs Lab 12/30/2015 1745 12/19/2015 1800 12/05/15 0325  WBC 10.6*  --  8.0  NEUTROABS 8.0*  --   --   HGB 13.7 13.9 12.7*  HCT 44.9 41.0 42.4  MCV 97.6  --  97.7  PLT 292  --  99991111   Basic Metabolic Panel:  Recent Labs Lab 12/21/2015 1745 12/28/2015 1800 12/05/15 0325  NA 150* 149* 149*  K 4.6 4.5 4.1  CL 110 109 110  CO2 30  --  31  GLUCOSE 160* 157* 149*  BUN 28* 31* 23*  CREATININE 1.02 1.10 0.76  CALCIUM 8.3*  --  8.3*   GFR: Estimated Creatinine Clearance: 81.1 mL/min (by C-G formula based on SCr of 0.76 mg/dL). Liver Function Tests:  Recent Labs Lab 12/29/2015 1745  AST 33  ALT 37  ALKPHOS 103  BILITOT 0.2*  PROT 6.2*  ALBUMIN 2.6*   No results for input(s): LIPASE, AMYLASE in the last 168 hours. No results for input(s): AMMONIA in the last 168 hours. Coagulation Profile: No results for input(s): INR, PROTIME in the last 168 hours. Cardiac Enzymes:  Recent  Labs Lab 12/09/2015 2203 12/05/15 0325  TROPONINI 0.14* 0.12*   BNP (last 3 results) No results for input(s): PROBNP in the last 8760 hours. HbA1C: No results for input(s): HGBA1C in the last 72 hours. CBG:  Recent Labs Lab 12/07/2015 1745 01/02/2016 2351  GLUCAP 150* 137*   Lipid Profile:  Recent Labs  12/05/15 0326  CHOL 124  HDL 38*  LDLCALC 73  TRIG 67  CHOLHDL 3.3   Thyroid Function Tests: No results for input(s): TSH, T4TOTAL, FREET4, T3FREE, THYROIDAB in the last 72 hours. Anemia Panel: No results for input(s): VITAMINB12, FOLATE, FERRITIN, TIBC, IRON, RETICCTPCT in  the last 72 hours. Urine analysis:    Component Value Date/Time   COLORURINE YELLOW 12/05/2015 0035   APPEARANCEUR CLEAR 12/05/2015 0035   LABSPEC 1.031 (H) 12/05/2015 0035   PHURINE 5.0 12/05/2015 0035   GLUCOSEU NEGATIVE 12/05/2015 0035   HGBUR NEGATIVE 12/05/2015 0035   BILIRUBINUR NEGATIVE 12/05/2015 0035   KETONESUR NEGATIVE 12/05/2015 0035   PROTEINUR NEGATIVE 12/05/2015 0035   UROBILINOGEN 1.0 01/11/2010 0818   NITRITE NEGATIVE 12/05/2015 0035   LEUKOCYTESUR NEGATIVE 12/05/2015 0035   Sepsis Labs: @LABRCNTIP (procalcitonin:4,lacticidven:4) ) Recent Results (from the past 240 hour(s))  MRSA PCR Screening     Status: None   Collection Time: 12/17/2015 11:35 PM  Result Value Ref Range Status   MRSA by PCR NEGATIVE NEGATIVE Final    Comment:        The GeneXpert MRSA Assay (FDA approved for NASAL specimens only), is one component of a comprehensive MRSA colonization surveillance program. It is not intended to diagnose MRSA infection nor to guide or monitor treatment for MRSA infections.      Radiological Exams on Admission: Ct Head Wo Contrast  Result Date: 12/20/2015 CLINICAL DATA:  Altered mental status EXAM: CT HEAD WITHOUT CONTRAST TECHNIQUE: Contiguous axial images were obtained from the base of the skull through the vertex without intravenous contrast. COMPARISON:  MRI  10/14/2015, CT 10/13/2015, 03/15/2015 FINDINGS: Brain: No acute intracranial hemorrhage is visualized. Large area of cystic encephalomalacia change within the right temporal lobe. Old infarcts in the left posterior frontal and temporal lobes. Persistent edema within the inferior left frontal lobe and the left temporal lobe. Mildly dense mass along the left sphenoid wing measures approximately 1.5 cm thick by 3.7 cm AP allowing for limited assessment without contrast. Left mildly dense posterior fossa mass measures approximately 1 cm and is also unchanged. Chronic extra-axial CSF collections at the cranial vertex, left interhemispheric region, and right greater than left anterior convexities consistent with chronic subdural hygromas. Ventricles are similar in size and morphology. Moderate periventricular and deep white matter small vessel disease, grossly unchanged. Vascular: No hyperdense vessels.  Carotid artery calcifications. Skull: Mastoid air cells are clear. Bilateral anterior craniotomy changes. Sinuses/Orbits: Minimal mucosal thickening in the right maxillary sinus. No acute orbital abnormality. Other: None IMPRESSION: 1. No acute intracranial hemorrhage. 2. 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 3. Multiple old bilateral appearing infarcts with encephalomalacia changes. 4. Bilateral extra-axial CSF collections consistent with chronic subdural hygroma. 5. Moderate periventricular and deep white matter small vessel changes. Electronically Signed   By: Donavan Foil M.D.   On: 12/07/2015 19:10   Ct Angio Chest Pe W/cm &/or Wo Cm  Result Date: 12/13/2015 CLINICAL DATA:  Dyspnea, altered mental status EXAM: CT ANGIOGRAPHY CHEST WITH CONTRAST TECHNIQUE: Multidetector CT imaging of the chest was performed using the standard protocol during bolus administration of intravenous contrast. Multiplanar CT image  reconstructions and MIPs were obtained to evaluate the vascular anatomy. CONTRAST:  80 cc Isovue 370 IV COMPARISON:  Chest radiographs dating back to 03/15/2015. 03/15/2015 CT abdomen which includes lung bases. FINDINGS: Cardiovascular: Satisfactory opacification of the pulmonary arteries to the segmental level. There are bilateral lobar, segmental and subsegmental pulmonary emboli to the upper lobes and right lower lobe as well as subsegmental pulmonary emboli to the left lower lobe. RV to LV ratio 1.1. Heart is top normal with coronary arteriosclerosis. There is aortic atherosclerosis with 4.7 cm ascending aortic aneurysm. Mediastinum/Nodes:  Esophagus is unremarkable. No lymphadenopathy. Trachea and mainstem bronchi are patent. Lungs/Pleura: There is atelectasis in both lower lobes, right middle lobe and lingula. Small right as well as trace left pleural effusions. Upper Abdomen: Normal appearing adrenal glands. Stable left renal mass ablation site with exophytic simple appearing cyst noted off the interpolar left kidney. Nonobstructing calcification in the mid right kidney. The visualized upper abdominal aorta is densely calcified as are its branch vessels. Musculoskeletal: No acute abnormality Review of the MIP images confirms the above findings. IMPRESSION: Positive for acute PE with CT evidence of right heart strain (RV/LV Ratio = 1.1) consistent with at least submassive (intermediate risk) PE. The presence of right heart strain has been associated with an increased risk of morbidity and mortality. Please activate Code PE by paging [phone]. Critical Value/emergent results were called by telephone at the time of interpretation on 12/26/2015 at 8:59 pm to Dr. Quintella Reichert , who verbally acknowledged these results. Right greater than left small pleural effusions with atelectasis. 4.7 cm ascending aortic aneurysm. New Recommend semi-annual imaging followup by CTA or MRA and referral to cardiothoracic surgery  if not already obtained. This recommendation follows 2010 ACCF/AHA/AATS/ACR/ASA/SCA/SCAI/SIR/STS/SVM Guidelines for the Diagnosis and Management of Patients With Thoracic Aortic Disease. Circulation. 2010; [id number] Electronically Signed   By: Ashley Royalty M.D.   On: 12/31/2015 21:00   Dg Chest Port 1 View  Result Date: 12/16/2015 CLINICAL DATA:  Hypoxia. Altered mental status. Atrial fibrillation. EXAM: PORTABLE CHEST 1 VIEW COMPARISON:  10/13/2015 FINDINGS: Mild cardiomegaly stable. Aortic atherosclerosis. Mild elevation of left hemidiaphragm is unchanged. No evidence of pulmonary infiltrate or pleural effusion. IMPRESSION: No active lung disease. Stable cardiomegaly and aortic atherosclerosis. Electronically Signed   By: Earle Gell M.D.   On: 01/03/2016 18:36     EKG: not complete stripes obtained, will get anone.   Assessment/Plan Principal Problem:   PE (pulmonary thromboembolism) (HCC) Active Problems:   Paroxysmal atrial fibrillation (HCC)   Hypertension   Acute encephalopathy   Seizures (HCC)   Hyperlipidemia   H/O: CVA (cerebrovascular accident)   Aphasia due to recent stroke   Right hemiparesis (HCC)   COPD (chronic obstructive pulmonary disease) (HCC)   Pulmonary embolism (HCC)   Hypernatremia   Elevated troponin   PE (pulmonary thromboembolism) ([place]): CT angiogram of the chest showed submassive PE with right heart screening. Currently hemodynamically stable. Patient is not a good candidate for thrombolytic treatment due to recent stroke (10/14/15). Currently hemodynamically stable. Given his multiple comorbidities, likely need palliative care. EDP discussed with patient's son on the phone, who agreed with palliative consul and medical treatment at this moment. Pt is DNR.  -admit to stepdown for close monitoring -heparin drip initiated -2D echocardiogram ordered -LE dopplers ordered to evaluate for DVT -trop x 3 -Palliative consul  Elevated trop: Most likely due  to demand ischemia secondary to PE -Aspirin per rectal -IV heparin -Follow-up on 2-D echo   Acute encephalopathy: Likely due  multifactor etiology, including hypernatremia, metabolic eneuropathy secondary to hypoxia from PE. CT head is negative for acute intracranial abnormalities. -Treat underlying issues -Frequent neuro check  Atrial Fibrillation: CHA2DS2-VASc Score is 5, needs oral anticoagulation, but patient is no AC, possibly due to high-risk of fall secondary to right-sided hemiparesis and seizure. Heart rate is controlled. No on AV node blokers -tele monitoring  -on ASA per rectal  HTN: -hold home oral meds -IV hydralazine when necessary  Seizures ([place]); -Seizure precaution -IV hydralazine.  HLD; -hold oral creastor  Hx of stroke: has right-sided hemiparesis and -Aspirin per rectal  COPD (chronic obstructive pulmonary disease) (HCC): stable -Atrovent nebs and prn albuterol  Hypernatremia: Na 150. -D5 at 75 cc/h -check Osmo of urine and plasma, urine sodium level -BMP q6h   DVT ppx: on IV hepain Code Status: DNR Family Communication: None at bed side. Disposition Plan:  Anticipate discharge back to previous SNF environment Consults called:  none Admission status: SDU/inpation       Date of Service 12/05/2015    Ivor Costa [facility] Pager [phone]  If 7PM-7AM, please contact night-coverage [email or web address] Password [email or web address] 12/05/2015, 7:09 AM

## 2015-12-04 NOTE — ED Notes (Signed)
Pt's CBG 150.  Informed Chrislyn, RN and Janett Billow, Therapist, sports.

## 2015-12-05 ENCOUNTER — Other Ambulatory Visit: Payer: Self-pay

## 2015-12-05 ENCOUNTER — Inpatient Hospital Stay (HOSPITAL_COMMUNITY): Payer: Medicare Other

## 2015-12-05 DIAGNOSIS — I2699 Other pulmonary embolism without acute cor pulmonale: Secondary | ICD-10-CM

## 2015-12-05 DIAGNOSIS — L8990 Pressure ulcer of unspecified site, unspecified stage: Secondary | ICD-10-CM | POA: Insufficient documentation

## 2015-12-05 LAB — BLOOD CULTURE ID PANEL (REFLEXED)
ACINETOBACTER BAUMANNII: NOT DETECTED
CANDIDA PARAPSILOSIS: NOT DETECTED
Candida albicans: NOT DETECTED
Candida glabrata: NOT DETECTED
Candida krusei: NOT DETECTED
Candida tropicalis: NOT DETECTED
ENTEROCOCCUS SPECIES: NOT DETECTED
Enterobacter cloacae complex: NOT DETECTED
Enterobacteriaceae species: NOT DETECTED
Escherichia coli: NOT DETECTED
HAEMOPHILUS INFLUENZAE: NOT DETECTED
KLEBSIELLA OXYTOCA: NOT DETECTED
Klebsiella pneumoniae: NOT DETECTED
LISTERIA MONOCYTOGENES: NOT DETECTED
METHICILLIN RESISTANCE: DETECTED — AB
NEISSERIA MENINGITIDIS: NOT DETECTED
PSEUDOMONAS AERUGINOSA: NOT DETECTED
Proteus species: NOT DETECTED
SERRATIA MARCESCENS: NOT DETECTED
STAPHYLOCOCCUS AUREUS BCID: NOT DETECTED
STREPTOCOCCUS PNEUMONIAE: NOT DETECTED
Staphylococcus species: DETECTED — AB
Streptococcus agalactiae: NOT DETECTED
Streptococcus pyogenes: NOT DETECTED
Streptococcus species: NOT DETECTED

## 2015-12-05 LAB — BASIC METABOLIC PANEL
ANION GAP: 8 (ref 5–15)
BUN: 23 mg/dL — ABNORMAL HIGH (ref 6–20)
CALCIUM: 8.3 mg/dL — AB (ref 8.9–10.3)
CHLORIDE: 110 mmol/L (ref 101–111)
CO2: 31 mmol/L (ref 22–32)
CREATININE: 0.76 mg/dL (ref 0.61–1.24)
GFR calc non Af Amer: >60 mL/min (ref >60)
Glucose, Bld: 149 mg/dL — ABNORMAL HIGH (ref 65–99)
Potassium: 4.1 mmol/L (ref 3.5–5.1)
SODIUM: 149 mmol/L — AB (ref 135–145)

## 2015-12-05 LAB — GLUCOSE, CAPILLARY
GLUCOSE-CAPILLARY: 111 mg/dL — AB (ref 65–99)
Glucose-Capillary: 113 mg/dL — ABNORMAL HIGH (ref 65–99)

## 2015-12-05 LAB — LIPID PANEL
CHOL/HDL RATIO: 3.3 ratio
CHOLESTEROL: 124 mg/dL (ref 0–200)
HDL: 38 mg/dL — ABNORMAL LOW (ref >40)
LDL Cholesterol: 73 mg/dL (ref 0–99)
TRIGLYCERIDES: 67 mg/dL (ref <150)
VLDL: 13 mg/dL (ref 0–40)

## 2015-12-05 LAB — CBC
HCT: 42.4 % (ref 39.0–52.0)
Hemoglobin: 12.7 g/dL — ABNORMAL LOW (ref 13.0–17.0)
MCH: 29.3 pg (ref 26.0–34.0)
MCHC: 30 g/dL (ref 30.0–36.0)
MCV: 97.7 fL (ref 78.0–100.0)
PLATELETS: 271 10*3/uL (ref 150–400)
RBC: 4.34 MIL/uL (ref 4.22–5.81)
RDW: 15.2 % (ref 11.5–15.5)
WBC: 8 10*3/uL (ref 4.0–10.5)

## 2015-12-05 LAB — SODIUM, URINE, RANDOM: SODIUM UR: 52 mmol/L

## 2015-12-05 LAB — URINALYSIS, ROUTINE W REFLEX MICROSCOPIC
BILIRUBIN URINE: NEGATIVE
GLUCOSE, UA: NEGATIVE mg/dL
HGB URINE DIPSTICK: NEGATIVE
Ketones, ur: NEGATIVE mg/dL
Leukocytes, UA: NEGATIVE
Nitrite: NEGATIVE
PROTEIN: NEGATIVE mg/dL
Specific Gravity, Urine: 1.031 — ABNORMAL HIGH (ref 1.005–1.030)
pH: 5 (ref 5.0–8.0)

## 2015-12-05 LAB — TROPONIN I
TROPONIN I: 0.08 ng/mL — AB (ref <0.03)
Troponin I: 0.12 ng/mL (ref <0.03)

## 2015-12-05 LAB — HEPARIN LEVEL (UNFRACTIONATED)
Heparin Unfractionated: 0.83 IU/mL — ABNORMAL HIGH (ref 0.30–0.70)
Heparin Unfractionated: 0.89 IU/mL — ABNORMAL HIGH (ref 0.30–0.70)

## 2015-12-05 LAB — OSMOLALITY, URINE: Osmolality, Ur: 612 mOsm/kg (ref 300–900)

## 2015-12-05 LAB — MRSA PCR SCREENING: MRSA BY PCR: NEGATIVE

## 2015-12-05 LAB — BRAIN NATRIURETIC PEPTIDE: B NATRIURETIC PEPTIDE 5: 282.9 pg/mL — AB (ref 0.0–100.0)

## 2015-12-05 MED ORDER — SODIUM CHLORIDE 0.9% FLUSH
3.0000 mL | Freq: Two times a day (BID) | INTRAVENOUS | Status: DC
  Administered 2015-12-05 – 2015-12-06 (×3): 3 mL via INTRAVENOUS

## 2015-12-05 MED ORDER — LORAZEPAM 2 MG/ML IJ SOLN: 1.0000 mg | INTRAMUSCULAR | Status: DC | PRN

## 2015-12-05 MED ORDER — WHITE PETROLATUM GEL
1.0000 "application " | Status: DC | PRN
  Administered 2015-12-05 (×2): 1 via TOPICAL
  Filled 2015-12-05: qty 1

## 2015-12-05 MED ORDER — MORPHINE SULFATE (PF) 4 MG/ML IV SOLN: 2.0000 mg | INTRAVENOUS | Status: DC | PRN

## 2015-12-05 MED ORDER — SODIUM CHLORIDE 0.9% FLUSH: 3.0000 mL | INTRAVENOUS | Status: DC | PRN

## 2015-12-05 MED ORDER — ORAL CARE MOUTH RINSE
15.0000 mL | Freq: Two times a day (BID) | OROMUCOSAL | Status: DC
  Administered 2015-12-05 – 2015-12-06 (×5): 15 mL via OROMUCOSAL

## 2015-12-05 MED ORDER — LORAZEPAM 2 MG/ML IJ SOLN
1.0000 mg | Freq: Three times a day (TID) | INTRAMUSCULAR | Status: DC
  Administered 2015-12-05 – 2015-12-06 (×5): 1 mg via INTRAVENOUS
  Filled 2015-12-05 (×5): qty 1

## 2015-12-05 NOTE — Progress Notes (Signed)
CRITICAL VALUE ALERT  Critical value received:  Serum Osmolarity 326, Troponin 0.14  Date of notification:  12/21/2015  Time of notification:  2330  Critical value read back: Yes  Nurse who received alert:  Lonn Georgia   MD notified (1st page):  Ivor Costa  Time of first page:    MD notified (2nd page):  Time of second page:  Responding MD:  Blaine Hamper  Time MD responded:  12/05/2015 0000

## 2015-12-05 NOTE — Progress Notes (Signed)
*  Preliminary Results* Bilateral lower extremity venous duplex completed. Bilateral lower extremities are negative for deep vein thrombosis. There is no evidence of Baker's cyst bilaterally.  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  12/05/2015 3:08 PM Maudry Mayhew, BS, RVT, RDCS, RDMS

## 2015-12-05 NOTE — Progress Notes (Signed)
PROGRESS NOTE    Arthur Wood  [id number] DOB: 29-Apr-1931 DOA: 12/20/2015 PCP: Kandice Hams, MD  Brief Narrative: Arthur Wood is a 82 y.o. male with medical history significant of stroke (nonverbal, right hemiparesis), seizure, left renal cancer, atrial fibrillation not on anticoagulants, hypertension, hyperlipidemia, COPD, asthma, who presented with unresponsiveness and hypoxia.  CT-head is negative for acute intracranial abnormalities. CT angiogram of the chest showed submassive PE with right heart strain   Assessment & Plan:   Principal Problem:   Acute massive PE with R heart strain with elevated troponin    Metabolic encephalopathy -pt is unresponsive and non verbal and hemiplegic at baseline since prior stroke    Recent CVA  -with R hemiplegia, Aphasia and Dysphagia     Paroxysmal atrial fibrillation (HCC)    Seizures (HCC)    H/O: CVA (cerebrovascular accident)    COPD (chronic obstructive pulmonary disease) (HCC)    Hypernatremia  I called and discussed patients condition with son [name] and recommended comfort care and stopping all RX. Son agreeable, transfer out of ICU, Comfort measures only   DVT prophylaxis:comfort care Code Status: DNR Family Communication: No family at bedside, called and d/w PoA [name] who was agreeable to comfort measures and stopping current Rx Disposition Plan: expect hospital demise   Subjective: Remains unresponsive  Objective: Vitals:   12/05/15 0500 12/05/15 0600 12/05/15 0756 12/05/15 0758  BP: 131/85 117/73    Pulse: 82 81    Resp: (!) 25 (!) 35    Temp:    98.6 F (37 C)  TempSrc:    Oral  SpO2: 98% 97% 100%   Weight:      Height:        Intake/Output Summary (Last 24 hours) at 12/05/15 1126 Last data filed at 12/05/15 1047  Gross per 24 hour  Intake          1623.25 ml  Output                0 ml  Net          1623.25 ml   Filed Weights   01/03/2016 1839 01/02/2016 2300  Weight: 98.7 kg (217 lb 9.5  oz) 95.2 kg (209 lb 14.1 oz)    Examination:  General exam: unresponsive, winces to painful stimuli Respiratory system: ronchi at bases Cardiovascular system: S1 & S2 heard, RRR. Marland Kitchen Gastrointestinal system: Abdomen is nondistended, soft and nontender. Central nervous system: unresponsive, R hemiplegia Extremities: 2plus edema Skin: No rashes, lesions or ulcers Psychiatry: unable to assess    Data Reviewed: I have personally reviewed following labs and imaging studies  CBC:  Recent Labs Lab 12/17/2015 1745 12/16/2015 1800 12/05/15 0325  WBC 10.6*  --  8.0  NEUTROABS 8.0*  --   --   HGB 13.7 13.9 12.7*  HCT 44.9 41.0 42.4  MCV 97.6  --  97.7  PLT 292  --  99991111   Basic Metabolic Panel:  Recent Labs Lab 12/17/2015 1745 12/25/2015 1800 12/05/15 0325  NA 150* 149* 149*  K 4.6 4.5 4.1  CL 110 109 110  CO2 30  --  31  GLUCOSE 160* 157* 149*  BUN 28* 31* 23*  CREATININE 1.02 1.10 0.76  CALCIUM 8.3*  --  8.3*   GFR: Estimated Creatinine Clearance: 81.1 mL/min (by C-G formula based on SCr of 0.76 mg/dL). Liver Function Tests:  Recent Labs Lab 12/05/2015 1745  AST 33  ALT 37  ALKPHOS 103  BILITOT 0.2*  PROT  6.2*  ALBUMIN 2.6*   No results for input(s): LIPASE, AMYLASE in the last 168 hours. No results for input(s): AMMONIA in the last 168 hours. Coagulation Profile: No results for input(s): INR, PROTIME in the last 168 hours. Cardiac Enzymes:  Recent Labs Lab 12/17/2015 2203 12/05/15 0325 12/05/15 0951  TROPONINI 0.14* 0.12* 0.08*   BNP (last 3 results) No results for input(s): PROBNP in the last 8760 hours. HbA1C: No results for input(s): HGBA1C in the last 72 hours. CBG:  Recent Labs Lab 12/07/2015 1745 12/30/2015 2351 12/05/15 0801 12/05/15 0923  GLUCAP 150* 137* 113* 111*   Lipid Profile:  Recent Labs  12/05/15 0326  CHOL 124  HDL 38*  LDLCALC 73  TRIG 67  CHOLHDL 3.3   Thyroid Function Tests: No results for input(s): TSH, T4TOTAL, FREET4,  T3FREE, THYROIDAB in the last 72 hours. Anemia Panel: No results for input(s): VITAMINB12, FOLATE, FERRITIN, TIBC, IRON, RETICCTPCT in the last 72 hours. Urine analysis:    Component Value Date/Time   COLORURINE YELLOW 12/05/2015 0035   APPEARANCEUR CLEAR 12/05/2015 0035   LABSPEC 1.031 (H) 12/05/2015 0035   PHURINE 5.0 12/05/2015 0035   GLUCOSEU NEGATIVE 12/05/2015 0035   HGBUR NEGATIVE 12/05/2015 0035   BILIRUBINUR NEGATIVE 12/05/2015 0035   KETONESUR NEGATIVE 12/05/2015 0035   PROTEINUR NEGATIVE 12/05/2015 0035   UROBILINOGEN 1.0 01/11/2010 0818   NITRITE NEGATIVE 12/05/2015 0035   LEUKOCYTESUR NEGATIVE 12/05/2015 0035   Sepsis Labs: @LABRCNTIP (procalcitonin:4,lacticidven:4)  ) Recent Results (from the past 240 hour(s))  MRSA PCR Screening     Status: None   Collection Time: 12/20/2015 11:35 PM  Result Value Ref Range Status   MRSA by PCR NEGATIVE NEGATIVE Final    Comment:        The GeneXpert MRSA Assay (FDA approved for NASAL specimens only), is one component of a comprehensive MRSA colonization surveillance program. It is not intended to diagnose MRSA infection nor to guide or monitor treatment for MRSA infections.          Radiology Studies: Ct Head Wo Contrast  Result Date: 12/24/2015 CLINICAL DATA:  Altered mental status EXAM: CT HEAD WITHOUT CONTRAST TECHNIQUE: Contiguous axial images were obtained from the base of the skull through the vertex without intravenous contrast. COMPARISON:  MRI 10/14/2015, CT 10/13/2015, 03/15/2015 FINDINGS: Brain: No acute intracranial hemorrhage is visualized. Large area of cystic encephalomalacia change within the right temporal lobe. Old infarcts in the left posterior frontal and temporal lobes. Persistent edema within the inferior left frontal lobe and the left temporal lobe. Mildly dense mass along the left sphenoid wing measures approximately 1.5 cm thick by 3.7 cm AP allowing for limited assessment without contrast. Left  mildly dense posterior fossa mass measures approximately 1 cm and is also unchanged. Chronic extra-axial CSF collections at the cranial vertex, left interhemispheric region, and right greater than left anterior convexities consistent with chronic subdural hygromas. Ventricles are similar in size and morphology. Moderate periventricular and deep white matter small vessel disease, grossly unchanged. Vascular: No hyperdense vessels.  Carotid artery calcifications. Skull: Mastoid air cells are clear. Bilateral anterior craniotomy changes. Sinuses/Orbits: Minimal mucosal thickening in the right maxillary sinus. No acute orbital abnormality. Other: None IMPRESSION: 1. No acute intracranial hemorrhage. 2. 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 3. Multiple old bilateral appearing infarcts with encephalomalacia changes. 4. Bilateral extra-axial CSF collections consistent with chronic subdural hygroma. 5. Moderate periventricular and deep  white matter small vessel changes. Electronically Signed   By: Donavan Foil M.D.   On: 12/05/2015 19:10   Ct Angio Chest Pe W/cm &/or Wo Cm  Result Date: 12/07/2015 CLINICAL DATA:  Dyspnea, altered mental status EXAM: CT ANGIOGRAPHY CHEST WITH CONTRAST TECHNIQUE: Multidetector CT imaging of the chest was performed using the standard protocol during bolus administration of intravenous contrast. Multiplanar CT image reconstructions and MIPs were obtained to evaluate the vascular anatomy. CONTRAST:  80 cc Isovue 370 IV COMPARISON:  Chest radiographs dating back to 03/15/2015. 03/15/2015 CT abdomen which includes lung bases. FINDINGS: Cardiovascular: Satisfactory opacification of the pulmonary arteries to the segmental level. There are bilateral lobar, segmental and subsegmental pulmonary emboli to the upper lobes and right lower lobe as well as subsegmental pulmonary emboli to the left lower  lobe. RV to LV ratio 1.1. Heart is top normal with coronary arteriosclerosis. There is aortic atherosclerosis with 4.7 cm ascending aortic aneurysm. Mediastinum/Nodes: Esophagus is unremarkable. No lymphadenopathy. Trachea and mainstem bronchi are patent. Lungs/Pleura: There is atelectasis in both lower lobes, right middle lobe and lingula. Small right as well as trace left pleural effusions. Upper Abdomen: Normal appearing adrenal glands. Stable left renal mass ablation site with exophytic simple appearing cyst noted off the interpolar left kidney. Nonobstructing calcification in the mid right kidney. The visualized upper abdominal aorta is densely calcified as are its branch vessels. Musculoskeletal: No acute abnormality Review of the MIP images confirms the above findings. IMPRESSION: Positive for acute PE with CT evidence of right heart strain (RV/LV Ratio = 1.1) consistent with at least submassive (intermediate risk) PE. The presence of right heart strain has been associated with an increased risk of morbidity and mortality. Please activate Code PE by paging [phone]. Critical Value/emergent results were called by telephone at the time of interpretation on 12/29/2015 at 8:59 pm to Dr. Quintella Reichert , who verbally acknowledged these results. Right greater than left small pleural effusions with atelectasis. 4.7 cm ascending aortic aneurysm. New Recommend semi-annual imaging followup by CTA or MRA and referral to cardiothoracic surgery if not already obtained. This recommendation follows 2010 ACCF/AHA/AATS/ACR/ASA/SCA/SCAI/SIR/STS/SVM Guidelines for the Diagnosis and Management of Patients With Thoracic Aortic Disease. Circulation. 2010; [id number] Electronically Signed   By: Ashley Royalty M.D.   On: 12/12/2015 21:00   Dg Chest Port 1 View  Result Date: 12/29/2015 CLINICAL DATA:  Hypoxia. Altered mental status. Atrial fibrillation. EXAM: PORTABLE CHEST 1 VIEW COMPARISON:  10/13/2015 FINDINGS: Mild  cardiomegaly stable. Aortic atherosclerosis. Mild elevation of left hemidiaphragm is unchanged. No evidence of pulmonary infiltrate or pleural effusion. IMPRESSION: No active lung disease. Stable cardiomegaly and aortic atherosclerosis. Electronically Signed   By: Earle Gell M.D.   On: 12/26/2015 18:36        Scheduled Meds: . aspirin  300 mg Rectal Daily  . ipratropium  0.5 mg Nebulization Q6H  . levETIRAcetam  500 mg Intravenous Q12H  . mouth rinse  15 mL Mouth Rinse BID  . sodium chloride flush  3 mL Intravenous Q12H   Continuous Infusions: . dextrose 75 mL (12/05/15 0700)  . heparin 1,300 Units/hr (12/05/15 1047)     LOS: 1 day    Time spent: 44min    Domenic Polite, MD [facility] Pager [phone]  If 7PM-7AM, please contact night-coverage [email or web address] Password [facility] 12/05/2015, 11:26 AM

## 2015-12-05 NOTE — Progress Notes (Signed)
ANTICOAGULATION CONSULT NOTE  Pharmacy Consult for heparin Indication: pulmonary embolus  No Known Allergies  Patient Measurements: Height: 5\' 9"  (175.3 cm) Weight: 209 lb 14.1 oz (95.2 kg) IBW/kg (Calculated) : 70.7  Vital Signs: Temp: 98.6 F (37 C) (12/02 0758) Temp Source: Oral (12/02 0758) BP: 117/73 (12/02 0600) Pulse Rate: 81 (12/02 0600)  Labs:  Recent Labs  12/08/2015 1745 12/08/2015 1800 12/12/2015 2203 12/05/15 0325 12/05/15 0951  HGB 13.7 13.9  --  12.7*  --   HCT 44.9 41.0  --  42.4  --   PLT 292  --   --  271  --   HEPARINUNFRC  --   --   --  0.83* 0.89*  CREATININE 1.02 1.10  --  0.76  --   TROPONINI  --   --  0.14* 0.12*  --    Estimated Creatinine Clearance: 81.1 mL/min (by C-G formula based on SCr of 0.76 mg/dL).   Assessment: Arthur Wood is a 82 y.o. male who presents to the Emergency Department with submassive pulmonary embolism. Right heart strain indicated on imaging. Heparin continues and heparin level remains elevated at 0.89. No bleeding noted.   Goal of Therapy:  Heparin level 0.3-0.7 units/ml Monitor platelets by anticoagulation protocol: Yes   Plan:  Decrease heparin gtt to 1300 units/hr Check an 8 hr heparin level Daily heparin level and CBC  Briani Maul, Rande Lawman 12/05/2015,10:54 AM

## 2015-12-05 NOTE — Progress Notes (Signed)
  PHARMACY - PHYSICIAN COMMUNICATION CRITICAL VALUE ALERT - BLOOD CULTURE IDENTIFICATION (BCID)  Results for orders placed or performed during the hospital encounter of 12/05/2015  Blood Culture ID Panel (Reflexed) (Collected: 12/13/2015  8:43 PM)  Result Value Ref Range   Enterococcus species NOT DETECTED NOT DETECTED   Listeria monocytogenes NOT DETECTED NOT DETECTED   Staphylococcus species DETECTED (A) NOT DETECTED   Staphylococcus aureus NOT DETECTED NOT DETECTED   Methicillin resistance DETECTED (A) NOT DETECTED   Streptococcus species NOT DETECTED NOT DETECTED   Streptococcus agalactiae NOT DETECTED NOT DETECTED   Streptococcus pneumoniae NOT DETECTED NOT DETECTED   Streptococcus pyogenes NOT DETECTED NOT DETECTED   Acinetobacter baumannii NOT DETECTED NOT DETECTED   Enterobacteriaceae species NOT DETECTED NOT DETECTED   Enterobacter cloacae complex NOT DETECTED NOT DETECTED   Escherichia coli NOT DETECTED NOT DETECTED   Klebsiella oxytoca NOT DETECTED NOT DETECTED   Klebsiella pneumoniae NOT DETECTED NOT DETECTED   Proteus species NOT DETECTED NOT DETECTED   Serratia marcescens NOT DETECTED NOT DETECTED   Haemophilus influenzae NOT DETECTED NOT DETECTED   Neisseria meningitidis NOT DETECTED NOT DETECTED   Pseudomonas aeruginosa NOT DETECTED NOT DETECTED   Candida albicans NOT DETECTED NOT DETECTED   Candida glabrata NOT DETECTED NOT DETECTED   Candida krusei NOT DETECTED NOT DETECTED   Candida parapsilosis NOT DETECTED NOT DETECTED   Candida tropicalis NOT DETECTED NOT DETECTED    Name of physician (or Provider) Contacted: Dr Broadus John (via text page)  Changes to prescribed antibiotics required: None - patient is noted to be comfort care. Antibiotics have been discontinued  Thank you for allowing pharmacy to be a part of this patient's care.  Alycia Rossetti, PharmD, BCPS Clinical Pharmacist Pager: [phone] 12/05/2015 5:17 PM

## 2015-12-05 NOTE — Consult Note (Signed)
Comfort care transition. Added scheduled Ativan for seizure prophylaxis- otherwise PRNs for discomfort ordered.  We will follow-anticipate hospital death.  Lane Hacker, DO Palliative Medicine

## 2015-12-05 NOTE — Progress Notes (Signed)
Patient noted to be semi-responsive and reacted only to sternal rub at the beginning of this shift by flexing upper extremities and moaning.  Resists movement when turned or ROJM performed.  Unable to assess orientation status.  Does not open eyes when instructed.  Skin is cool to touch and has obvious 4+ BLE edema with generalized edema elsewhere resembling anasarca.  Knees slightly mottled in appearance.   Spoke with son this am re:  his current status and he also spoke with MD.  Arthur Wood, patient is now comfort care and awaiting transfer to a  medical floor for palliative care and support.

## 2015-12-05 NOTE — Progress Notes (Signed)
Pt O2 sat was in the 70s on 6L nasal cannula, placed pt on 15L non-breather, pt O2 increased to 90s. Paged MD. Will continue to monitor.

## 2015-12-05 NOTE — Progress Notes (Signed)
ANTICOAGULATION CONSULT NOTE - Follow Up Consult  Pharmacy Consult for heparin Indication: pulmonary embolus  Labs:  Recent Labs  12/05/2015 1745 12/12/2015 1800 12/25/2015 2203 12/05/15 0325  HGB 13.7 13.9  --  12.7*  HCT 44.9 41.0  --  42.4  PLT 292  --   --  271  HEPARINUNFRC  --   --   --  0.83*  CREATININE 1.02 1.10  --   --   TROPONINI  --   --  0.14*  --      Assessment/Plan:  82yo male above goal on heparin with initial dosing for PE w/ RHS though lab was drawn early so suspect this is likely 2/2 bolus. Will continue gtt at current rate for now and check additional level.   Wynona Neat, PharmD, BCPS  12/05/2015,4:45 AM

## 2015-12-05 NOTE — Progress Notes (Addendum)
No charge  Palliative Medicine consult noted. Due to high referral volume, there may be a delay seeing this patient. Please call the Palliative Medicine Team office at [phone] if recommendations are needed in the interim.  Thank you for inviting [place] to see this patient.  Imogene Burn, [place] [facility] [phone]

## 2015-12-06 LAB — BLOOD CULTURE ID PANEL (REFLEXED)
Acinetobacter baumannii: NOT DETECTED
CANDIDA GLABRATA: NOT DETECTED
CANDIDA KRUSEI: NOT DETECTED
Candida albicans: NOT DETECTED
Candida parapsilosis: NOT DETECTED
Candida tropicalis: NOT DETECTED
ENTEROBACTER CLOACAE COMPLEX: NOT DETECTED
ENTEROBACTERIACEAE SPECIES: NOT DETECTED
ENTEROCOCCUS SPECIES: NOT DETECTED
ESCHERICHIA COLI: NOT DETECTED
Haemophilus influenzae: NOT DETECTED
Klebsiella oxytoca: NOT DETECTED
Klebsiella pneumoniae: NOT DETECTED
LISTERIA MONOCYTOGENES: NOT DETECTED
NEISSERIA MENINGITIDIS: NOT DETECTED
PROTEUS SPECIES: NOT DETECTED
PSEUDOMONAS AERUGINOSA: NOT DETECTED
STAPHYLOCOCCUS SPECIES: NOT DETECTED
STREPTOCOCCUS AGALACTIAE: NOT DETECTED
STREPTOCOCCUS PNEUMONIAE: NOT DETECTED
STREPTOCOCCUS PYOGENES: NOT DETECTED
Serratia marcescens: NOT DETECTED
Staphylococcus aureus (BCID): NOT DETECTED
Streptococcus species: NOT DETECTED

## 2015-12-06 LAB — HEMOGLOBIN A1C
Hgb A1c MFr Bld: 5.9 % — ABNORMAL HIGH (ref 4.8–5.6)
MEAN PLASMA GLUCOSE: 123 mg/dL

## 2015-12-06 LAB — URINE CULTURE: CULTURE: NO GROWTH

## 2015-12-07 LAB — CULTURE, BLOOD (ROUTINE X 2)

## 2015-12-07 NOTE — Progress Notes (Signed)
Left voicemail on Arthur Wood phone.  Asked him to please call me at [phone]. Will Try again.

## 2015-12-07 NOTE — Progress Notes (Signed)
Left voicemail for [employer].

## 2015-12-08 LAB — LEVETIRACETAM LEVEL: Levetiracetam Lvl: 16.3 ug/mL (ref 10.0–40.0)

## 2015-12-10 LAB — CULTURE, BLOOD (ROUTINE X 2): Culture: NO GROWTH

## 2016-01-04 NOTE — Discharge Summary (Signed)
Death Summary  [name] [id number] DOB: 1931-07-12 DOA: 12/14/15  PCP: Kandice Hams, MD  Admit date: 12-14-2015 Date of Death: Dec 16, 2015  Final Diagnoses:    Acute hypoxic resp failure   Metabolic encephalopathy    PE (pulmonary thromboembolism) ([place]) Active Problems:   Paroxysmal atrial fibrillation (HCC)   Hypertension   Acute encephalopathy   Seizures (HCC)   Hyperlipidemia   H/O: CVA (cerebrovascular accident)   Aphasia due to recent stroke   Right hemiparesis (HCC)   COPD (chronic obstructive pulmonary disease) (HCC)   Pulmonary embolism (HCC)   Hypernatremia   Elevated troponin   Pressure injury of skin     History of present illness:  Arthur Wood a 83 y.o.malewith medical history significant of stroke (nonverbal, right hemiparesis), seizure, left renal cancer, atrial fibrillation not on anticoagulants, hypertension, hyperlipidemia, COPD, asthma, who presented with unresponsiveness and hypoxia.  CT-head is negative for acute intracranial abnormalities. CT angiogram of the chest showed submassive PE with right heart strain  Hospital Course:  1. Acute massive PE with R heart strain with elevated troponin   2.  Metabolic encephalopathy -pt is unresponsive and non verbal and hemiplegic at baseline since prior stroke   3.  Recent extensive CVA  -with R hemiplegia, Aphasia and Dysphagia   4.   Paroxysmal atrial fibrillation (HCC)   5.   Seizures ([place])   6.   COPD (chronic obstructive pulmonary disease) ([place])   7.   Hypernatremia  I called and discussed patients condition with son [name] 12/2 and recommended comfort care and stopping all treatment, son agreed due to patients critical condition and very poor baseline since recent stroke. -Comfort measures initiated -expired on Dec 16, 2022  Time: 49min  Signed:  Michaeleen Down  [facility] 12/18/2015, 4:02 PM

## 2016-01-04 NOTE — Progress Notes (Signed)
PROGRESS NOTE    Arthur Wood  [id number] DOB: Jul 17, 1931 DOA: 12/14/2015 PCP: Kandice Hams, MD  Brief Narrative: Arthur Wood is a 83 y.o. male with medical history significant of stroke (nonverbal, right hemiparesis), seizure, left renal cancer, atrial fibrillation not on anticoagulants, hypertension, hyperlipidemia, COPD, asthma, who presented with unresponsiveness and hypoxia.  CT-head is negative for acute intracranial abnormalities. CT angiogram of the chest showed submassive PE with right heart strain   Assessment & Plan:   Principal Problem:   1. Acute massive PE with R heart strain with elevated troponin   2.  Metabolic encephalopathy -pt is unresponsive and non verbal and hemiplegic at baseline since prior stroke   3.  Recent extensive CVA  -with R hemiplegia, Aphasia and Dysphagia   4.   Paroxysmal atrial fibrillation (HCC)   5.   Seizures ([place])   6.   COPD (chronic obstructive pulmonary disease) ([place])   7.   Hypernatremia  I called and discussed patients condition with son [name] 12/2 and recommended comfort care and stopping all RX. Son agreeable, transfered out of ICU, Comfort measures only -expect hospital demise, continue current Rx  DVT prophylaxis:comfort care Code Status: DNR Family Communication: No family at bedside, called and d/w PoA [name] who was agreeable to comfort measures and stopping current Rx Disposition Plan: expect hospital demise   Subjective: Remains unresponsive  Objective: Vitals:   12/05/15 0758 12/05/15 1213 12/05/15 1810 Jan 01, 2016 0434  BP:   111/73 115/74  Pulse:   89 (!) 107  Resp:   (!) 25 20  Temp: 98.6 F (37 C) 98.1 F (36.7 C) 98.1 F (36.7 C) 98.1 F (36.7 C)  TempSrc: Oral Oral Oral Oral  SpO2:   90% (!) 88%  Weight:      Height:        Intake/Output Summary (Last 24 hours) at January 01, 2016 1205 Last data filed at 01-01-16 0900  Gross per 24 hour  Intake                0 ml  Output                0 ml  Net                 0 ml   Filed Weights   12/17/2015 1839 12/13/2015 2300  Weight: 98.7 kg (217 lb 9.5 oz) 95.2 kg (209 lb 14.1 oz)    Examination:  General exam: unresponsive, winces to painful stimuli Respiratory system: ronchi at bases Cardiovascular system: S1 & S2 heard, RRR. Marland Kitchen Gastrointestinal system: Abdomen is nondistended, soft and nontender. Central nervous system: unresponsive, R hemiplegia Extremities: 2plus edema Skin: No rashes, lesions or ulcers Psychiatry: unable to assess    Data Reviewed: I have personally reviewed following labs and imaging studies  CBC:  Recent Labs Lab 12/31/2015 1745 12/08/2015 1800 12/05/15 0325  WBC 10.6*  --  8.0  NEUTROABS 8.0*  --   --   HGB 13.7 13.9 12.7*  HCT 44.9 41.0 42.4  MCV 97.6  --  97.7  PLT 292  --  99991111   Basic Metabolic Panel:  Recent Labs Lab 12/15/2015 1745 12/29/2015 1800 12/05/15 0325  NA 150* 149* 149*  K 4.6 4.5 4.1  CL 110 109 110  CO2 30  --  31  GLUCOSE 160* 157* 149*  BUN 28* 31* 23*  CREATININE 1.02 1.10 0.76  CALCIUM 8.3*  --  8.3*   GFR: Estimated Creatinine Clearance: 81.1 mL/min (  by C-G formula based on SCr of 0.76 mg/dL). Liver Function Tests:  Recent Labs Lab 12/31/2015 1745  AST 33  ALT 37  ALKPHOS 103  BILITOT 0.2*  PROT 6.2*  ALBUMIN 2.6*   No results for input(s): LIPASE, AMYLASE in the last 168 hours. No results for input(s): AMMONIA in the last 168 hours. Coagulation Profile: No results for input(s): INR, PROTIME in the last 168 hours. Cardiac Enzymes:  Recent Labs Lab 12/25/2015 2203 12/05/15 0325 12/05/15 0951  TROPONINI 0.14* 0.12* 0.08*   BNP (last 3 results) No results for input(s): PROBNP in the last 8760 hours. HbA1C:  Recent Labs  12/05/15 0325  HGBA1C 5.9*   CBG:  Recent Labs Lab 12/29/2015 1745 12/12/2015 2351 12/05/15 0801 12/05/15 0923  GLUCAP 150* 137* 113* 111*   Lipid Profile:  Recent Labs  12/05/15 0326  CHOL 124  HDL 38*  LDLCALC 73  TRIG 67   CHOLHDL 3.3   Thyroid Function Tests: No results for input(s): TSH, T4TOTAL, FREET4, T3FREE, THYROIDAB in the last 72 hours. Anemia Panel: No results for input(s): VITAMINB12, FOLATE, FERRITIN, TIBC, IRON, RETICCTPCT in the last 72 hours. Urine analysis:    Component Value Date/Time   COLORURINE YELLOW 12/05/2015 0035   APPEARANCEUR CLEAR 12/05/2015 0035   LABSPEC 1.031 (H) 12/05/2015 0035   PHURINE 5.0 12/05/2015 0035   GLUCOSEU NEGATIVE 12/05/2015 0035   HGBUR NEGATIVE 12/05/2015 0035   BILIRUBINUR NEGATIVE 12/05/2015 0035   KETONESUR NEGATIVE 12/05/2015 0035   PROTEINUR NEGATIVE 12/05/2015 0035   UROBILINOGEN 1.0 01/11/2010 0818   NITRITE NEGATIVE 12/05/2015 0035   LEUKOCYTESUR NEGATIVE 12/05/2015 0035   Sepsis Labs: @LABRCNTIP (procalcitonin:4,lacticidven:4)  ) Recent Results (from the past 240 hour(s))  Blood Culture (routine x 2)     Status: None (Preliminary result)   Collection Time: 12/08/2015  6:07 PM  Result Value Ref Range Status   Specimen Description BLOOD RIGHT ARM  Final   Special Requests BOTTLES DRAWN AEROBIC AND ANAEROBIC 5CC  Final   Culture NO GROWTH < 24 HOURS  Final   Report Status PENDING  Incomplete  Blood Culture (routine x 2)     Status: Abnormal (Preliminary result)   Collection Time: 12/21/2015  8:43 PM  Result Value Ref Range Status   Specimen Description BLOOD RIGHT ANTECUBITAL  Final   Special Requests BOTTLES DRAWN AEROBIC AND ANAEROBIC 5CC  Final   Culture  Setup Time   Final    GRAM POSITIVE COCCI IN CLUSTERS ANAEROBIC BOTTLE ONLY CRITICAL RESULT CALLED TO, READ BACK BY AND VERIFIED WITH: E MARTIN,PHARMD AT 1715 12/05/15 BY L BENFIELD GRAM POSITIVE RODS AEROBIC BOTTLE ONLY Organism ID to follow    Culture (A)  Final    STAPHYLOCOCCUS SPECIES (COAGULASE NEGATIVE) THE SIGNIFICANCE OF ISOLATING THIS ORGANISM FROM A SINGLE SET OF BLOOD CULTURES WHEN MULTIPLE SETS ARE DRAWN IS UNCERTAIN. PLEASE NOTIFY THE MICROBIOLOGY DEPARTMENT WITHIN ONE  WEEK IF SPECIATION AND SENSITIVITIES ARE REQUIRED.    Report Status PENDING  Incomplete  Blood Culture ID Panel (Reflexed)     Status: Abnormal   Collection Time: 12/11/2015  8:43 PM  Result Value Ref Range Status   Enterococcus species NOT DETECTED NOT DETECTED Final   Listeria monocytogenes NOT DETECTED NOT DETECTED Final   Staphylococcus species DETECTED (A) NOT DETECTED Final    Comment: CRITICAL RESULT CALLED TO, READ BACK BY AND VERIFIED WITH: E MARTIN,PHARMD AT 1715 12/05/15 BY L BENFIELD    Staphylococcus aureus NOT DETECTED NOT DETECTED Final  Methicillin resistance DETECTED (A) NOT DETECTED Final    Comment: CRITICAL RESULT CALLED TO, READ BACK BY AND VERIFIED WITH: E MARTIN,PHARMD AT 1715 12/05/15 BY L BENFIELD    Streptococcus species NOT DETECTED NOT DETECTED Final   Streptococcus agalactiae NOT DETECTED NOT DETECTED Final   Streptococcus pneumoniae NOT DETECTED NOT DETECTED Final   Streptococcus pyogenes NOT DETECTED NOT DETECTED Final   Acinetobacter baumannii NOT DETECTED NOT DETECTED Final   Enterobacteriaceae species NOT DETECTED NOT DETECTED Final   Enterobacter cloacae complex NOT DETECTED NOT DETECTED Final   Escherichia coli NOT DETECTED NOT DETECTED Final   Klebsiella oxytoca NOT DETECTED NOT DETECTED Final   Klebsiella pneumoniae NOT DETECTED NOT DETECTED Final   Proteus species NOT DETECTED NOT DETECTED Final   Serratia marcescens NOT DETECTED NOT DETECTED Final   Haemophilus influenzae NOT DETECTED NOT DETECTED Final   Neisseria meningitidis NOT DETECTED NOT DETECTED Final   Pseudomonas aeruginosa NOT DETECTED NOT DETECTED Final   Candida albicans NOT DETECTED NOT DETECTED Final   Candida glabrata NOT DETECTED NOT DETECTED Final   Candida krusei NOT DETECTED NOT DETECTED Final   Candida parapsilosis NOT DETECTED NOT DETECTED Final   Candida tropicalis NOT DETECTED NOT DETECTED Final  MRSA PCR Screening     Status: None   Collection Time: 12/18/2015 11:35 PM    Result Value Ref Range Status   MRSA by PCR NEGATIVE NEGATIVE Final    Comment:        The GeneXpert MRSA Assay (FDA approved for NASAL specimens only), is one component of a comprehensive MRSA colonization surveillance program. It is not intended to diagnose MRSA infection nor to guide or monitor treatment for MRSA infections.   Urine culture     Status: None   Collection Time: 12/05/15 12:35 AM  Result Value Ref Range Status   Specimen Description URINE, RANDOM  Final   Special Requests NONE  Final   Culture NO GROWTH  Final   Report Status 2015-12-23 FINAL  Final         Radiology Studies: Ct Head Wo Contrast  Result Date: 12/25/2015 CLINICAL DATA:  Altered mental status EXAM: CT HEAD WITHOUT CONTRAST TECHNIQUE: Contiguous axial images were obtained from the base of the skull through the vertex without intravenous contrast. COMPARISON:  MRI 10/14/2015, CT 10/13/2015, 03/15/2015 FINDINGS: Brain: No acute intracranial hemorrhage is visualized. Large area of cystic encephalomalacia change within the right temporal lobe. Old infarcts in the left posterior frontal and temporal lobes. Persistent edema within the inferior left frontal lobe and the left temporal lobe. Mildly dense mass along the left sphenoid wing measures approximately 1.5 cm thick by 3.7 cm AP allowing for limited assessment without contrast. Left mildly dense posterior fossa mass measures approximately 1 cm and is also unchanged. Chronic extra-axial CSF collections at the cranial vertex, left interhemispheric region, and right greater than left anterior convexities consistent with chronic subdural hygromas. Ventricles are similar in size and morphology. Moderate periventricular and deep white matter small vessel disease, grossly unchanged. Vascular: No hyperdense vessels.  Carotid artery calcifications. Skull: Mastoid air cells are clear. Bilateral anterior craniotomy changes. Sinuses/Orbits: Minimal mucosal thickening  in the right maxillary sinus. No acute orbital abnormality. Other: None IMPRESSION: 1. No acute intracranial hemorrhage. 2. 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 3. Multiple old bilateral appearing infarcts with encephalomalacia changes. 4. Bilateral extra-axial CSF collections consistent with chronic subdural hygroma.  5. Moderate periventricular and deep white matter small vessel changes. Electronically Signed   By: Donavan Foil M.D.   On: 12/17/2015 19:10   Ct Angio Chest Pe W/cm &/or Wo Cm  Result Date: 12/26/2015 CLINICAL DATA:  Dyspnea, altered mental status EXAM: CT ANGIOGRAPHY CHEST WITH CONTRAST TECHNIQUE: Multidetector CT imaging of the chest was performed using the standard protocol during bolus administration of intravenous contrast. Multiplanar CT image reconstructions and MIPs were obtained to evaluate the vascular anatomy. CONTRAST:  80 cc Isovue 370 IV COMPARISON:  Chest radiographs dating back to 03/15/2015. 03/15/2015 CT abdomen which includes lung bases. FINDINGS: Cardiovascular: Satisfactory opacification of the pulmonary arteries to the segmental level. There are bilateral lobar, segmental and subsegmental pulmonary emboli to the upper lobes and right lower lobe as well as subsegmental pulmonary emboli to the left lower lobe. RV to LV ratio 1.1. Heart is top normal with coronary arteriosclerosis. There is aortic atherosclerosis with 4.7 cm ascending aortic aneurysm. Mediastinum/Nodes: Esophagus is unremarkable. No lymphadenopathy. Trachea and mainstem bronchi are patent. Lungs/Pleura: There is atelectasis in both lower lobes, right middle lobe and lingula. Small right as well as trace left pleural effusions. Upper Abdomen: Normal appearing adrenal glands. Stable left renal mass ablation site with exophytic simple appearing cyst noted off the interpolar left kidney. Nonobstructing calcification  in the mid right kidney. The visualized upper abdominal aorta is densely calcified as are its branch vessels. Musculoskeletal: No acute abnormality Review of the MIP images confirms the above findings. IMPRESSION: Positive for acute PE with CT evidence of right heart strain (RV/LV Ratio = 1.1) consistent with at least submassive (intermediate risk) PE. The presence of right heart strain has been associated with an increased risk of morbidity and mortality. Please activate Code PE by paging [phone]. Critical Value/emergent results were called by telephone at the time of interpretation on 12/20/2015 at 8:59 pm to Dr. Quintella Reichert , who verbally acknowledged these results. Right greater than left small pleural effusions with atelectasis. 4.7 cm ascending aortic aneurysm. New Recommend semi-annual imaging followup by CTA or MRA and referral to cardiothoracic surgery if not already obtained. This recommendation follows 2010 ACCF/AHA/AATS/ACR/ASA/SCA/SCAI/SIR/STS/SVM Guidelines for the Diagnosis and Management of Patients With Thoracic Aortic Disease. Circulation. 2010; [id number] Electronically Signed   By: Ashley Royalty M.D.   On: 12/15/2015 21:00   Dg Chest Port 1 View  Result Date: 12/29/2015 CLINICAL DATA:  Hypoxia. Altered mental status. Atrial fibrillation. EXAM: PORTABLE CHEST 1 VIEW COMPARISON:  10/13/2015 FINDINGS: Mild cardiomegaly stable. Aortic atherosclerosis. Mild elevation of left hemidiaphragm is unchanged. No evidence of pulmonary infiltrate or pleural effusion. IMPRESSION: No active lung disease. Stable cardiomegaly and aortic atherosclerosis. Electronically Signed   By: Earle Gell M.D.   On: 12/18/2015 18:36        Scheduled Meds: . LORazepam  1 mg Intravenous Q8H  . mouth rinse  15 mL Mouth Rinse BID  . sodium chloride flush  3 mL Intravenous Q12H  . sodium chloride flush  3 mL Intravenous Q12H   Continuous Infusions:    LOS: 2 days    Time spent: 60min    Domenic Polite, MD [facility] Pager [phone]  If 7PM-7AM, please contact night-coverage [email or web address] Password [email or web address] December 31, 2015, 12:05 PM

## 2016-01-04 DEATH — deceased

## 2016-01-18 ENCOUNTER — Ambulatory Visit: Payer: Medicare Other | Admitting: Neurology

## 2018-02-20 IMAGING — CR DG CHEST 2V
2 series · 2 of 2 positions shown · non-contrast
Comparison: Portable chest x-ray March 15, 2015

CLINICAL DATA: Altered mental status. History of seizures, previous
CVA, atrial fibrillation, asthma, former smoker, renal cell
malignancy.

EXAM:
CHEST  2 VIEW

[chest lat]
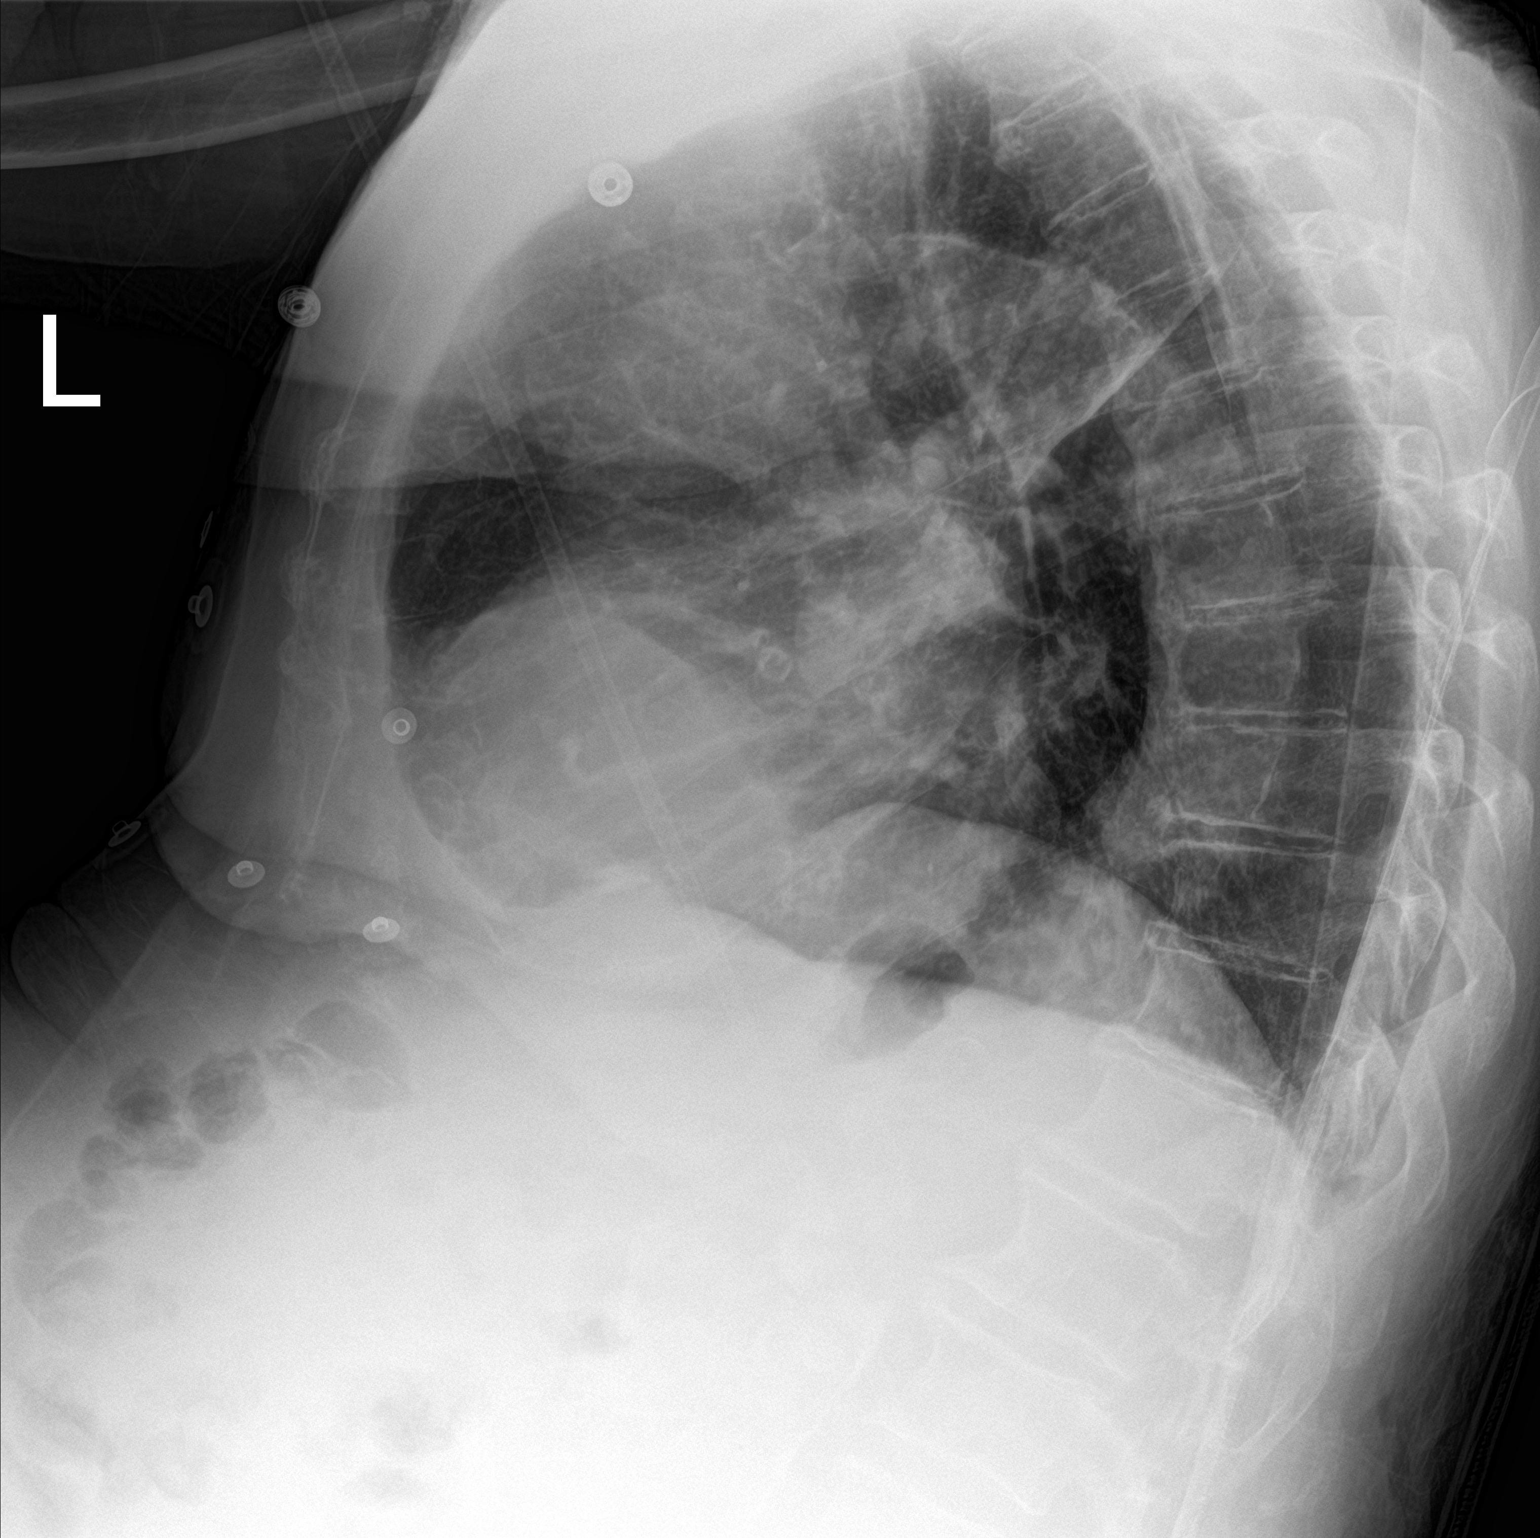

[chest ap]
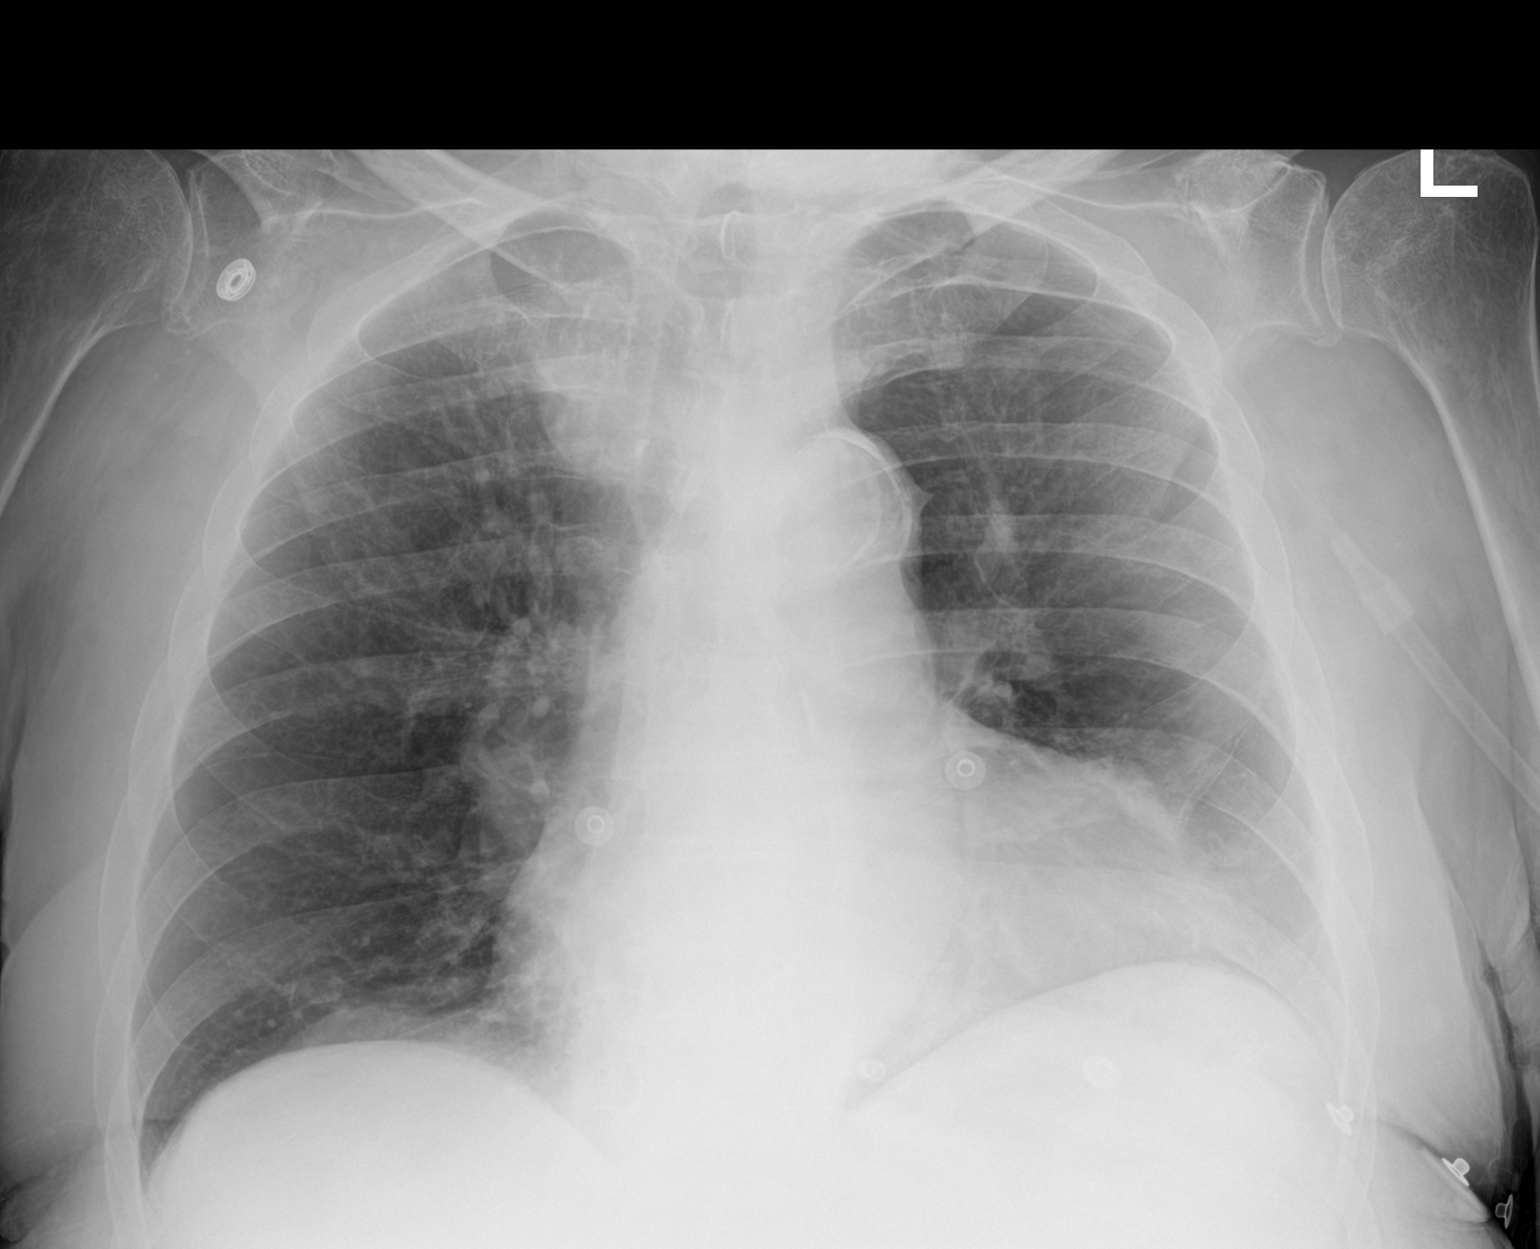

[2 of 2 positions shown; findings below may reference images not displayed]

FINDINGS: The right lung is well-expanded and clear. On the left mild
elevation of the hemidiaphragm is again observed. There is no acute
infiltrate. There is no pleural effusion or pneumothorax. The
cardiac silhouette is enlarged. The pulmonary vascularity is normal.
There is calcification in the wall of the aortic arch. There is
stable soft tissue fullness in the paratracheal regions that is
likely vascular. The bony thorax exhibits no acute abnormality.
IMPRESSION: No acute cardiopulmonary abnormality. Stable cardiomegaly. Aortic
atherosclerosis.

## 2018-02-20 IMAGING — CT CT HEAD W/O CM
3 of 4 series · 17 of 47 positions shown, 20 images · non-contrast
Comparison: 03/15/2015

CLINICAL DATA: Difficulty with speech.

EXAM:
CT HEAD WITHOUT CONTRAST
TECHNIQUE: Contiguous axial images were obtained from the base of the skull
through the vertex without intravenous contrast.

[Series 201: head w/o, idose (1) · axial · non-contrast · 0.45mm/px · z∈[+64,+200]mm · 11 of 33 slices shown, 14 images]
[im 3/33  brain]
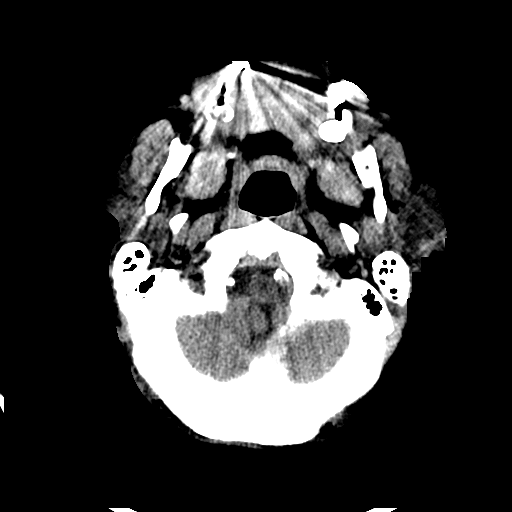
[im 3/33  bone]
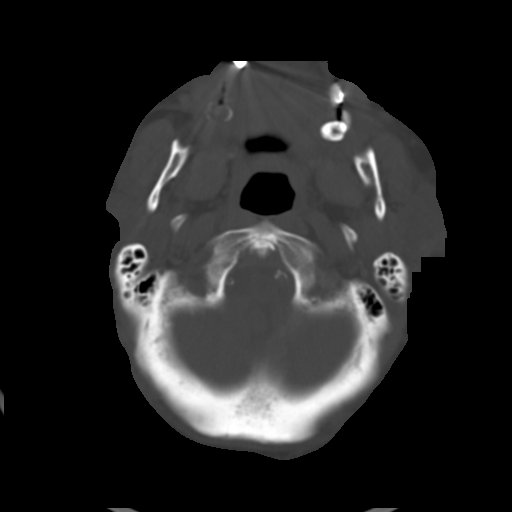
[im 5/33  brain]
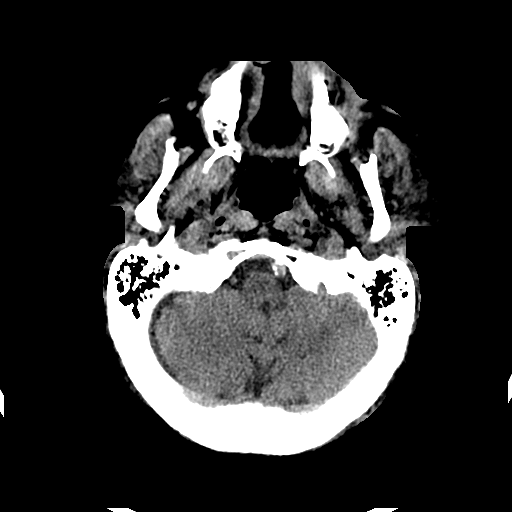
[im 7/33  brain]
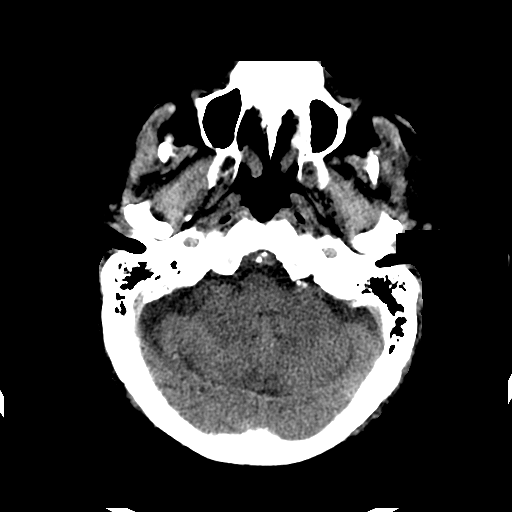
[im 12/33  brain]
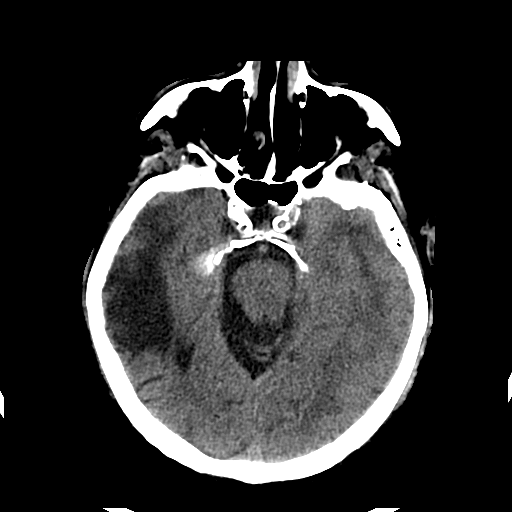
[im 14/33  brain]
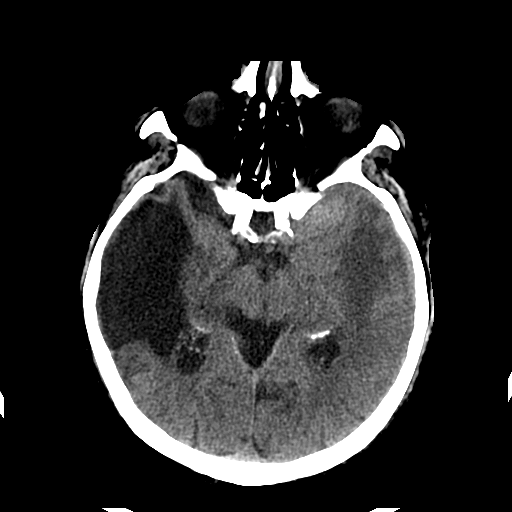
[im 14/33  bone]
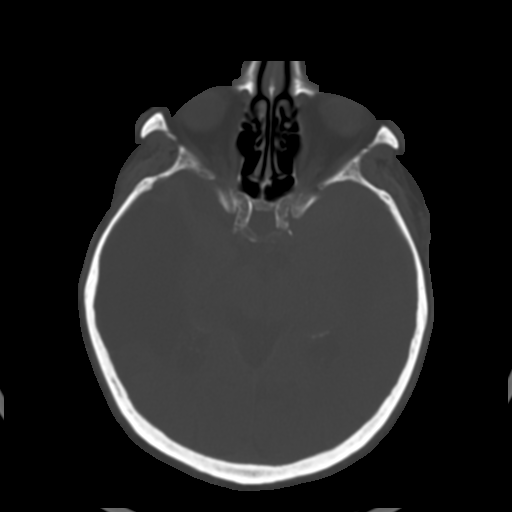
[im 17/33  brain]
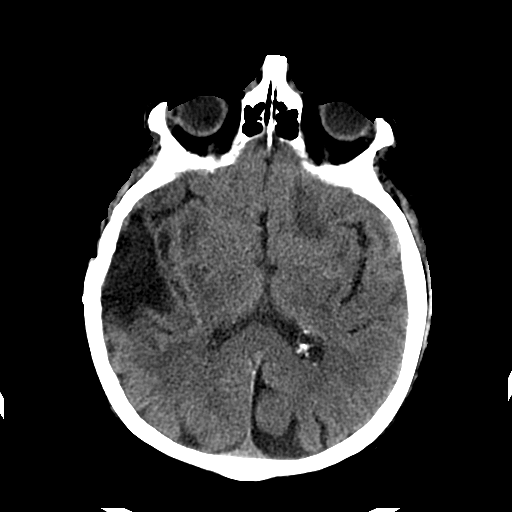
[im 19/33  brain]
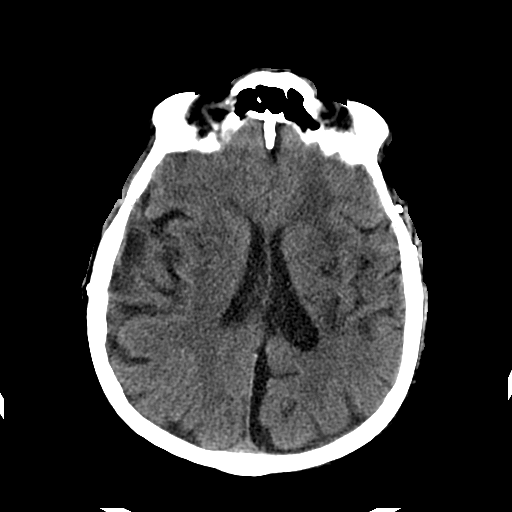
[im 21/33  brain]
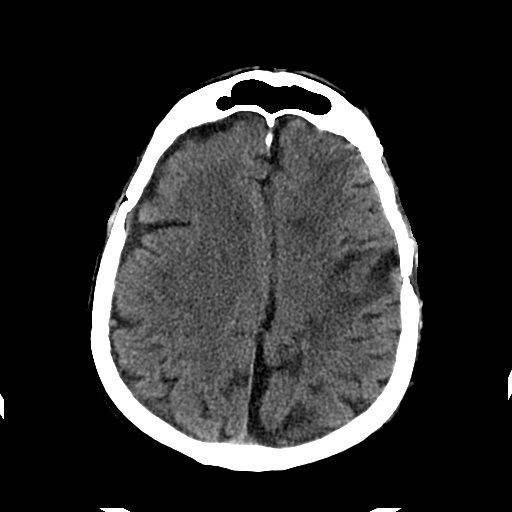
[im 26/33  brain]
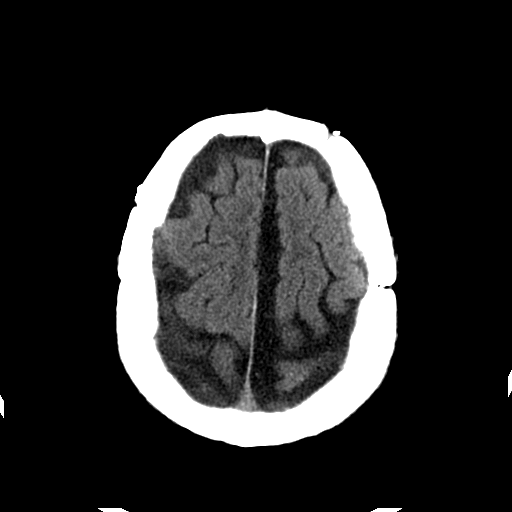
[im 26/33  bone]
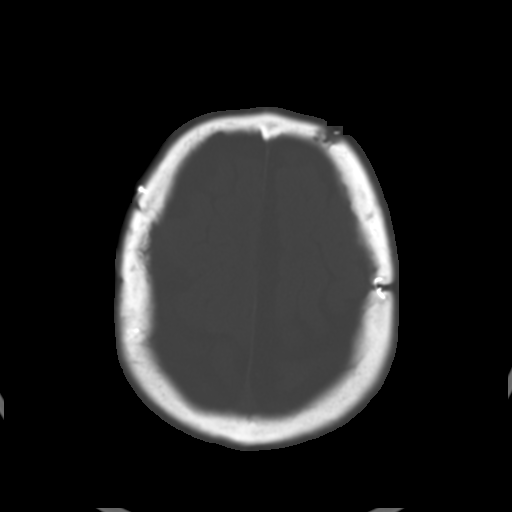
[im 28/33  brain]
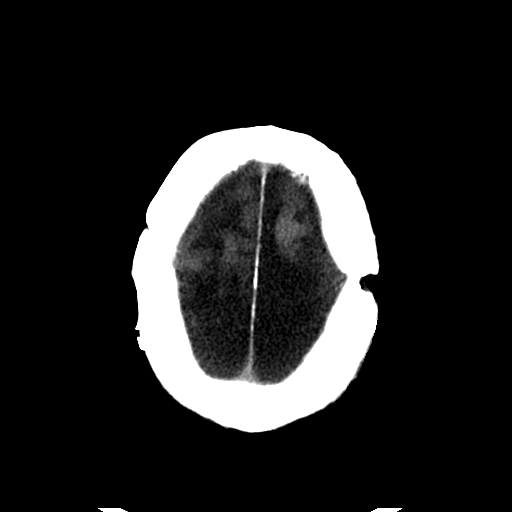
[im 30/33  brain]
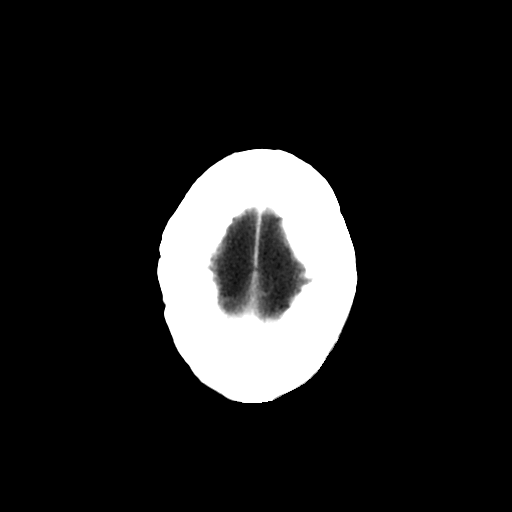

[Series 203: coronal st, idose (1) · coronal · 0.40mm/px · 3 of 73 slices shown]
[im 25/73  brain]
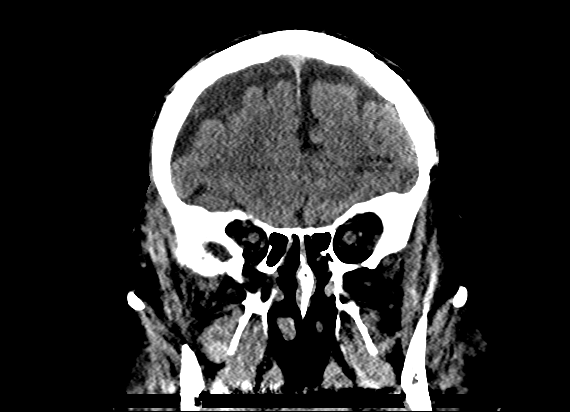
[im 33/73  brain]
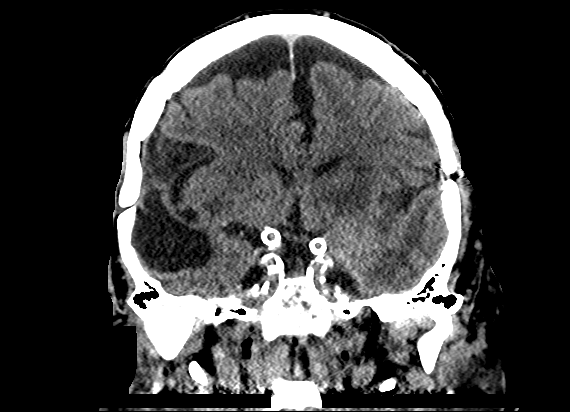
[im 41/73  brain]
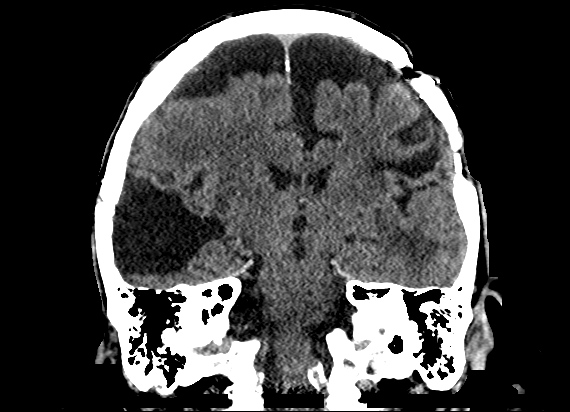

[Series 204: sagittal st, idose (1) · sagittal · 0.40mm/px · 3 of 73 slices shown]
[im 25/73  brain]
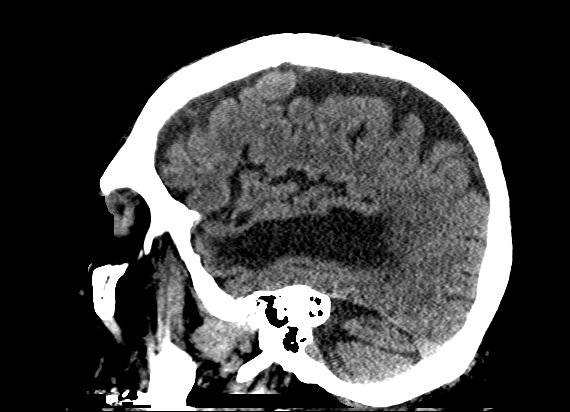
[im 37/73  brain]
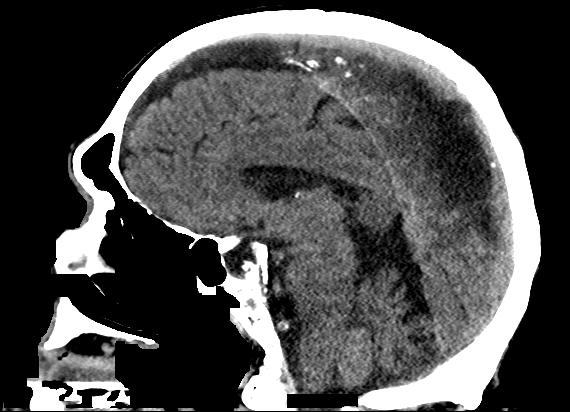
[im 49/73  brain]
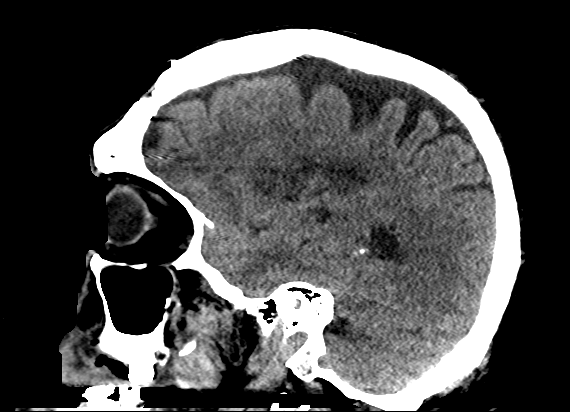

[17 of 47 positions shown; findings below may reference images not displayed]

FINDINGS: Brain: Large area of encephalomalacia is again noted involving the
right temporal lobe. Similar appearance of low-attenuation edema
involving the left temporal lobe with overlying left sphenoid wing
meningioma. Left parietal lobe encephalomalacia is again identified
and appears unchanged, image 20 of series 201. CSF attenuation
overlying the right frontal lobe is unchanged from previous exam
measuring 11 mm in thickness, image 25 of series 201. 8 mm
meningioma overlying the left cerebellar hemisphere is unchanged,
image 5 of series 201. No evidence for acute brain infarct. No
intracranial hemorrhage identified.

Vascular: No hyperdense vessel or unexpected calcification.

Skull: Previous right parietal craniotomy and left frontal
craniotomy.

Sinuses/Orbits: No acute finding.

Other: None.
IMPRESSION: 1. No acute intracranial abnormalities.
2. Stable encephalomalacia involving the right temporal lobe.
3. Stable subdural hygroma overlying the right frontal lobe.
4. Similar appearance of left sphenoid wing meningioma with brain
edema involving the left temporal lobe.
5. Stable left posterior fossa meningioma.
